# Patient Record
Sex: Male | Born: 1950 | Race: White | Hispanic: No | Marital: Married | Smoking: Former smoker
Health system: Southern US, Community
[De-identification: ages and names within clinical notes are randomized; demographics above are authoritative.]

## PROBLEM LIST (undated history)

## (undated) DIAGNOSIS — M1990 Unspecified osteoarthritis, unspecified site: Secondary | ICD-10-CM

## (undated) DIAGNOSIS — M109 Gout, unspecified: Secondary | ICD-10-CM

## (undated) DIAGNOSIS — K219 Gastro-esophageal reflux disease without esophagitis: Secondary | ICD-10-CM

## (undated) DIAGNOSIS — G459 Transient cerebral ischemic attack, unspecified: Secondary | ICD-10-CM

## (undated) DIAGNOSIS — I639 Cerebral infarction, unspecified: Secondary | ICD-10-CM

## (undated) HISTORY — DX: Gout, unspecified: M10.9

## (undated) HISTORY — PX: KNEE ARTHROSCOPY: SUR90

## (undated) HISTORY — PX: COLONOSCOPY: SHX174

## (undated) HISTORY — DX: Gastro-esophageal reflux disease without esophagitis: K21.9

## (undated) HISTORY — PX: KIDNEY DONATION: SHX685

## (undated) HISTORY — DX: Unspecified osteoarthritis, unspecified site: M19.90

---
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
## 2016-06-03 ENCOUNTER — Ambulatory Visit (INDEPENDENT_AMBULATORY_CARE_PROVIDER_SITE_OTHER): Payer: Medicare HMO | Admitting: Family Medicine

## 2016-06-03 ENCOUNTER — Encounter: Payer: Self-pay | Admitting: Family Medicine

## 2016-06-03 VITALS — BP 122/80 | HR 84 | Temp 98.4°F | Resp 12 | Ht 69.0 in | Wt 211.1 lb

## 2016-06-03 DIAGNOSIS — M109 Gout, unspecified: Secondary | ICD-10-CM | POA: Insufficient documentation

## 2016-06-03 DIAGNOSIS — M1009 Idiopathic gout, multiple sites: Secondary | ICD-10-CM

## 2016-06-03 DIAGNOSIS — J309 Allergic rhinitis, unspecified: Secondary | ICD-10-CM

## 2016-06-03 DIAGNOSIS — M1710 Unilateral primary osteoarthritis, unspecified knee: Secondary | ICD-10-CM | POA: Insufficient documentation

## 2016-06-03 DIAGNOSIS — M170 Bilateral primary osteoarthritis of knee: Secondary | ICD-10-CM | POA: Diagnosis not present

## 2016-06-03 DIAGNOSIS — M179 Osteoarthritis of knee, unspecified: Secondary | ICD-10-CM | POA: Insufficient documentation

## 2016-06-03 MED ORDER — COLCHICINE 0.6 MG PO TABS: 0.6000 mg | ORAL_TABLET | Freq: Two times a day (BID) | ORAL | 1 refills | Status: DC | PRN

## 2016-06-03 MED ORDER — ALLOPURINOL 100 MG PO TABS: 100.0000 mg | ORAL_TABLET | Freq: Every day | ORAL | 1 refills | Status: DC

## 2016-06-03 MED ORDER — FLUTICASONE PROPIONATE 50 MCG/ACT NA SUSP: 2.0000 | Freq: Every day | NASAL | 3 refills | Status: DC

## 2016-06-03 NOTE — Progress Notes (Signed)
HPI:  [name] is a 65 y.o.male here today to establish care with me.  Last CPE: 3 years ago. [place] back to state in November 2016, he was living in [place].  -Concerns and/or follow up today: med refills, cough.  Today he is concerned about left nostril congestion and postnasal drip sensation that triggers cough, symptoms started about 2 weeks ago. Non-productive cough, no associated wheezing or chest pain. Former smoker. He has not noted chills, fever, odynophagia, or facial pain. No epistaxis or Hx of GERD. No sick contact. + Bilateral eye itching and rhinorrhea.   Gout:  He was diagnosed with gout about 4 years ago, started on Allopurinol 100 mg daily. Once he moved to [place], Allopurinol 100 mg was not available, so he continued with allopurinol 300 mg every day. He ran out of medication about 2 weeks ago, he has not had a gout attack in 4 years. Allopurinol was started after 5-7 acute attacks.   He also has history of bilateral knee osteoarthritis, he is on Ibuprofen 600-800 mg daily. Pain seems to be aggravated by weight gain, certain activities as going up and down stairs, kneeling, bending exacerbate pain. Alleviated by rest and Ibuprofen. Mild-moderate aching.    Hx of HLD, last FLP about 3 years ago. He is on non pharmacologic treatment.  S/P nephrectomy, kidney donor in 2006.  He  Is trying to follow a healthy diet and is exercising regularly.     Review of Systems  Constitutional: Negative for activity change, appetite change, fatigue, fever and unexpected weight change.  HENT: Positive for congestion, postnasal drip and rhinorrhea. Negative for dental problem, mouth sores, nosebleeds, sinus pressure, sore throat and trouble swallowing.   Eyes: Positive for itching. Negative for discharge, redness and visual disturbance.  Respiratory: Positive for cough. Negative for apnea, shortness of breath, wheezing and stridor.        Former smoker    Cardiovascular: Negative for chest pain, palpitations and leg swelling.  Gastrointestinal: Negative for abdominal pain, nausea and vomiting.       No changes in bowel habits  Genitourinary: Negative for decreased urine volume, dysuria and hematuria.  Musculoskeletal: Positive for arthralgias. Negative for back pain and gait problem.  Skin: Negative for pallor and rash.  Neurological: Negative for dizziness, seizures, weakness, numbness and headaches.  Psychiatric/Behavioral: Negative for confusion. The patient is not nervous/anxious.      No current outpatient prescriptions on file prior to visit.   No current facility-administered medications on file prior to visit.      Past Medical History:  Diagnosis Date  . Arthritis   . Hyperlipidemia     Not on File  Family History  Problem Relation Age of Onset  . Arthritis Mother   . Arthritis Father     Social History   Social History  . Marital status: Married    Spouse name: N/A  . Number of children: N/A  . Years of education: N/A   Social History Main Topics  . Smoking status: Former Smoker    Quit date: 11/03/1998  . Smokeless tobacco: None  . Alcohol use None  . Drug use: Unknown  . Sexual activity: Not Asked   Other Topics Concern  . None   Social History Narrative  . None     Vitals:   06/03/16 1458  BP: 122/80  Pulse: 84  Resp: 12  Temp: 98.4 F (36.9 C)   Body mass index is 31.18  kg/m.   O2 sat at RA 97%.   Physical Exam  Constitutional: He is oriented to person, place, and time. He appears well-developed. No distress.  HENT:  Head: Atraumatic.  Nose: Septal deviation present. Right sinus exhibits no maxillary sinus tenderness and no frontal sinus tenderness. Left sinus exhibits no maxillary sinus tenderness and no frontal sinus tenderness.  Mouth/Throat: Uvula is midline, oropharynx is clear and moist and mucous membranes are normal.  Postnasal drainage.  Eyes: Conjunctivae and EOM are  normal. Pupils are equal, round, and reactive to light.  Cardiovascular: Normal rate and regular rhythm.   No murmur heard. Pulses:      Dorsalis pedis pulses are 2+ on the right side, and 2+ on the left side.  Respiratory: Effort normal and breath sounds normal. No respiratory distress.  A few times nonproductive cough during visit  GI: Soft. He exhibits no mass. There is no tenderness.  Musculoskeletal: He exhibits edema (1+ pitting edema LE bilateral).  Knee crepitus bilateral,L>R. mild limitation of ROM  Lymphadenopathy:    He has no cervical adenopathy.  Neurological: He is alert and oriented to person, place, and time. He has normal strength.  Skin: Skin is warm. No erythema.  Psychiatric: He has a normal mood and affect.  Well groomed, good eye contact.        ASSESSMENT AND PLAN:    Joseph Orr was seen today for new patient (initial visit).  Diagnoses and all orders for this visit:  Osteoarthritis of both knees, unspecified osteoarthritis type  Weight loss may help. Recommend continuing regular low impact exercise, add Tai Chi. Some side effects of NSAIDs were discussed, recommended trying OTC Tylenol 650 mg 3 times per day.  Gouty arthritis  Resume Allopurinol at lower dose, 100 mg daily. Colchicine twice per day for 1-2 weeks and then as needed. Further recommendations would be given according to lab results. Low-purine diet.  -     allopurinol (ZYLOPRIM) 100 MG tablet; Take 1 tablet (100 mg total) by mouth daily. -     colchicine 0.6 MG tablet; Take 1 tablet (0.6 mg total) by mouth 2 (two) times daily as needed. -     Basic Metabolic Panel -     Uric acid  Allergic rhinitis, unspecified allergic rhinitis type  Cough could be caused by postnasal drainage. He will try intranasal steroid and OTC antihistaminic, Zyrtec 10 mg daily. If symptoms are not better he is going to need ENT evaluation.    -     fluticasone (FLONASE) 50 MCG/ACT nasal spray; Place 2  sprays into both nostrils daily.      -He was advised to return or notify a doctor immediately if symptoms worsen or persist or new concerns arise. -Planning on CPE in 2 months. -Reporting colonoscopy done 5 years ago     Joseph Hamstra G. [place], MD  [facility]. [place] office.

## 2016-06-03 NOTE — Patient Instructions (Addendum)
A few things to remember from today's visit:   Allergic rhinitis, unspecified allergic rhinitis type - Plan: fluticasone (FLONASE) 50 MCG/ACT nasal spray  Gouty arthritis - Plan: allopurinol (ZYLOPRIM) 100 MG tablet, colchicine 0.6 MG tablet, Basic Metabolic Panel, Uric acid  Osteoarthritis of both knees, unspecified osteoarthritis type  Low purine diet to prevent gout. Take colchicine twice daily for 1-2 weeks. Colchicine to continue 2 tablets once upon acute gout onset.  Try Tylenol 650 mg over-the-counter instead ibuprofen, Tai Chi might also help with knee pain.  Over-the-counter Zyrtec or cetirizine 10 mg daily.   We have ordered labs or studies at this visit.  It can take up to 1-2 weeks for results and processing. IF results require follow up or explanation, we will call you with instructions. Clinically stable results will be released to your [facility]. If you have not heard from [place] or cannot find your results in [facility] in 2 weeks please contact our office at [phone].  If you are not yet signed up for [facility], please consider signing up  Please be sure medication list is accurate. If a new problem present, please set up appointment sooner than planned today.

## 2016-06-03 NOTE — Progress Notes (Signed)
Pre visit review using our clinic review tool, if applicable. No additional management support is needed unless otherwise documented below in the visit note. 

## 2016-06-04 LAB — BASIC METABOLIC PANEL
BUN: 12 mg/dL (ref 6–23)
CHLORIDE: 102 meq/L (ref 96–112)
CO2: 24 mEq/L (ref 19–32)
Calcium: 9.3 mg/dL (ref 8.4–10.5)
Creatinine, Ser: 1.27 mg/dL (ref 0.40–1.50)
GFR: 60.49 mL/min (ref >60.00)
Glucose, Bld: 81 mg/dL (ref 70–99)
POTASSIUM: 4.4 meq/L (ref 3.5–5.1)
SODIUM: 138 meq/L (ref 135–145)

## 2016-06-04 LAB — URIC ACID: Uric Acid, Serum: 9.8 mg/dL — ABNORMAL HIGH (ref 4.0–7.8)

## 2016-11-21 ENCOUNTER — Other Ambulatory Visit: Payer: Self-pay | Admitting: Family Medicine

## 2016-11-21 DIAGNOSIS — M109 Gout, unspecified: Secondary | ICD-10-CM

## 2017-02-23 ENCOUNTER — Other Ambulatory Visit: Payer: Self-pay | Admitting: Family Medicine

## 2017-02-23 DIAGNOSIS — M109 Gout, unspecified: Secondary | ICD-10-CM

## 2017-03-11 ENCOUNTER — Ambulatory Visit (INDEPENDENT_AMBULATORY_CARE_PROVIDER_SITE_OTHER): Payer: Medicare HMO | Admitting: Family Medicine

## 2017-03-11 ENCOUNTER — Encounter: Payer: Self-pay | Admitting: Family Medicine

## 2017-03-11 VITALS — BP 159/98 | HR 122 | Temp 98.4°F | Ht 69.0 in | Wt 212.0 lb

## 2017-03-11 DIAGNOSIS — J209 Acute bronchitis, unspecified: Secondary | ICD-10-CM

## 2017-03-11 MED ORDER — HYDROCODONE-HOMATROPINE 5-1.5 MG/5ML PO SYRP: 5.0000 mL | ORAL_SOLUTION | ORAL | 0 refills | Status: DC | PRN

## 2017-03-11 MED ORDER — METHYLPREDNISOLONE ACETATE 40 MG/ML IJ SUSP
40.0000 mg | Freq: Once | INTRAMUSCULAR | Status: AC
  Administered 2017-03-11: 40 mg via INTRAMUSCULAR

## 2017-03-11 MED ORDER — METHYLPREDNISOLONE ACETATE 80 MG/ML IJ SUSP
80.0000 mg | Freq: Once | INTRAMUSCULAR | Status: AC
  Administered 2017-03-11: 80 mg via INTRAMUSCULAR

## 2017-03-11 MED ORDER — AZITHROMYCIN 250 MG PO TABS: ORAL_TABLET | ORAL | 0 refills | Status: DC

## 2017-03-11 NOTE — Progress Notes (Signed)
   Subjective:    Patient ID: Troy Sineichard Lee Kopecky, male    DOB: July 16, 1951, 66 y.o.   MRN: [id number]  HPI Here for 5 days of chest tightness, coughing up clear sputum, and ST. No fever. Using Zyrtec.    Review of Systems  Constitutional: Negative.   HENT: Positive for congestion, postnasal drip and sore throat. Negative for sinus pain and sinus pressure.   Eyes: Negative.   Respiratory: Positive for cough, chest tightness and wheezing. Negative for shortness of breath.   Cardiovascular: Negative.        Objective:   Physical Exam  Constitutional: He appears well-developed and well-nourished.  HENT:  Right Ear: External ear normal.  Left Ear: External ear normal.  Nose: Nose normal.  Mouth/Throat: Oropharynx is clear and moist.  Eyes: Conjunctivae are normal.  Neck: No thyromegaly present.  Pulmonary/Chest: Effort normal. No respiratory distress. He has no rales.  Scattered wheezes and rhonchi   Lymphadenopathy:    He has no cervical adenopathy.          Assessment & Plan:  Bronchitis, treated with a Zpack and a steroid shot.  Gershon CraneStephen Fry, MD

## 2017-03-11 NOTE — Addendum Note (Signed)
Addended by: Janelle FloorHOMPSON, Cassell Voorhies B on: 03/11/2017 01:48 PM   Modules accepted: Orders

## 2017-03-11 NOTE — Patient Instructions (Signed)
WE NOW OFFER   Joseph Orr's FAST TRACK!!!  SAME DAY Appointments for ACUTE CARE  Such as: Sprains, Injuries, cuts, abrasions, rashes, muscle pain, joint pain, back pain Colds, flu, sore throats, headache, allergies, cough, fever  Ear pain, sinus and eye infections Abdominal pain, nausea, vomiting, diarrhea, upset stomach Animal/insect bites  3 Easy Ways to Schedule: Walk-In Scheduling Call in scheduling Mychart Sign-up: [email or web address]         

## 2017-03-19 ENCOUNTER — Encounter: Payer: Self-pay | Admitting: Family Medicine

## 2017-03-19 ENCOUNTER — Ambulatory Visit (INDEPENDENT_AMBULATORY_CARE_PROVIDER_SITE_OTHER): Payer: Medicare HMO | Admitting: Family Medicine

## 2017-03-19 VITALS — BP 152/98 | HR 81 | Temp 98.6°F | Ht 69.0 in | Wt 215.0 lb

## 2017-03-19 DIAGNOSIS — J209 Acute bronchitis, unspecified: Secondary | ICD-10-CM

## 2017-03-19 MED ORDER — ALBUTEROL SULFATE HFA 108 (90 BASE) MCG/ACT IN AERS
2.0000 | INHALATION_SPRAY | RESPIRATORY_TRACT | 0 refills | Status: DC | PRN
Start: 2017-03-19 — End: 2018-04-06

## 2017-03-19 MED ORDER — LEVOFLOXACIN 500 MG PO TABS: 500.0000 mg | ORAL_TABLET | Freq: Every day | ORAL | 0 refills | Status: AC

## 2017-03-19 NOTE — Progress Notes (Signed)
   Subjective:    Patient ID: Joseph Orr, male    DOB: 01-Aug-1951, 66 y.o.   MRN: [id number]  HPI Here to recheck a bronchitis. He was seen here on 03-11-17 and was treated with a steroid shot and a Zpack. He now feels better but he still has a dry cough. He has some wheezing at times.    Review of Systems  Constitutional: Negative.   HENT: Negative.   Eyes: Negative.   Respiratory: Positive for cough, chest tightness and wheezing. Negative for shortness of breath.   Cardiovascular: Negative.        Objective:   Physical Exam  Constitutional: He appears well-developed and well-nourished.  HENT:  Right Ear: External ear normal.  Left Ear: External ear normal.  Nose: Nose normal.  Mouth/Throat: Oropharynx is clear and moist.  Eyes: Conjunctivae are normal.  Neck: No thyromegaly present.  Pulmonary/Chest: Effort normal. No respiratory distress. He has no rales.  Soft scattered wheezes   Lymphadenopathy:    He has no cervical adenopathy.          Assessment & Plan:  Partially treated bronchitis. Use Levaquin and add an albuterol inhaler.  Gershon CraneStephen Haydyn Liddell, MD

## 2017-03-19 NOTE — Patient Instructions (Signed)
WE NOW OFFER   Joseph Orr's FAST TRACK!!!  SAME DAY Appointments for ACUTE CARE  Such as: Sprains, Injuries, cuts, abrasions, rashes, muscle pain, joint pain, back pain Colds, flu, sore throats, headache, allergies, cough, fever  Ear pain, sinus and eye infections Abdominal pain, nausea, vomiting, diarrhea, upset stomach Animal/insect bites  3 Easy Ways to Schedule: Walk-In Scheduling Call in scheduling Mychart Sign-up: [email or web address]         

## 2017-03-27 ENCOUNTER — Telehealth: Payer: Self-pay | Admitting: Family Medicine

## 2017-03-27 DIAGNOSIS — M109 Gout, unspecified: Secondary | ICD-10-CM

## 2017-03-27 MED ORDER — ALLOPURINOL 100 MG PO TABS: 100.0000 mg | ORAL_TABLET | Freq: Every day | ORAL | 0 refills | Status: DC

## 2017-03-27 NOTE — Telephone Encounter (Signed)
I sent in a Rx for 30 days. Please get patient to schedule a follow up or a CPE, hasn't been seen since 06/2016.

## 2017-03-27 NOTE — Telephone Encounter (Signed)
°  The patient needs his Rx refilled:   allopurinol (ZYLOPRIM) 100 MG tablet Sent to:  [facility] [address] - [place], [place] - [address] [place] [phone] (Phone) [phone] (Fax)

## 2017-04-20 ENCOUNTER — Telehealth: Payer: Self-pay | Admitting: Family Medicine

## 2017-04-20 DIAGNOSIS — M109 Gout, unspecified: Secondary | ICD-10-CM

## 2017-04-20 MED ORDER — ALLOPURINOL 100 MG PO TABS: 100.0000 mg | ORAL_TABLET | Freq: Every day | ORAL | 0 refills | Status: DC

## 2017-04-20 NOTE — Telephone Encounter (Signed)
Rx sent. Patient will not receive any further refills without having an appointment.

## 2017-04-20 NOTE — Telephone Encounter (Signed)
Patient needs allopurinol (ZYLOPRIM) 100 MG tablet   Sent to:  [facility] [address] - [place], [place] - [address] [place] [phone] (Phone) [phone] (Fax)   He would like a 90 day supply on automatic refill, so he don't have to keep coming here every month requesting a refill.

## 2017-04-24 NOTE — Telephone Encounter (Signed)
lmovm to call back and schedule follow up or cpe appt  With Dr SwazilandJordan

## 2017-06-16 NOTE — Telephone Encounter (Signed)
Pt states they will be moving out of [place] and will no longer need prescription refills.  Will be getting a new dr where they go.

## 2017-06-16 NOTE — Telephone Encounter (Signed)
Noted. Thanks.

## 2017-07-21 DIAGNOSIS — E785 Hyperlipidemia, unspecified: Secondary | ICD-10-CM | POA: Insufficient documentation

## 2017-07-21 NOTE — Progress Notes (Addendum)
HPI:  Joseph Orr is a 66 y.o.male here today for his routine physical examination.  Last CPE: 2016 [facility] 2016.  He lives with wife.  Regular exercise 3 or more times per week: Just started a month ago. Silver Sneaker 4-5 times per week.  Following a healthy diet: Yes.  Providers he sees regularly:   Eye care provider: Dr Eduardo Osier. Last eye exam 6 months, no glaucoma.  Chronic medical problems: HLD,gout, s/p nephrectomy (kidney donor 2006), allergic rhinitis, and OA (knee).  Hx of STD's: Denies.   Immunization History  Administered Date(s) Administered  .    Marland Kitchen Pneumococcal Polysaccharide-23 11/03/2012  . Tdap 11/03/2012   Functional Status Survey: Is the patient deaf or have difficulty hearing?: No Does the patient have difficulty seeing, even when wearing glasses/contacts?: No Does the patient have difficulty concentrating, remembering, or making decisions?: No Does the patient have difficulty walking or climbing stairs?: Yes (Stairs because knee OA) Does the patient have difficulty dressing or bathing?: No Does the patient have difficulty doing errands alone such as visiting a doctor's office or shopping?: No  Depression screen PHQ 2/9 07/22/2017  Decreased Interest 0  Down, Depressed, Hopeless 0  PHQ - 2 Score 0       Mini-Cog - 07/22/17 0949    Normal clock drawing test? yes   How many words correct? 3      Hearing Screening             Right ear:   Pass Pass Pass  Pass    Left ear:   Pass Pass Pass  Pass      Visual Acuity Screening   Right eye Left eye Both eyes  Without correction:     With correction:    -Hep C screening: Not sure.  Last colon cancer screening: 03/2010, 10 years f/u recommended. Last prostate ca screening: 2016. Nocturia sometimes. Denies dysuria,increased urinary frequency, gross hematuria,or decreased urine output.   -Denies  high alcohol intake or Hx of illicit drug use. He drinks about 2-3 beers daily. Former smoker.   Gout: He is on Allopurinol 100 mg daily. He has not had a gout attack sine 10/2012. Tolerating medication well.   Review of Systems  Constitutional: Negative for activity change, appetite change, fatigue, fever and unexpected weight change.  HENT: Negative for dental problem, mouth sores, nosebleeds, sore throat, trouble swallowing and voice change.   Eyes: Negative for redness and visual disturbance.  Respiratory: Negative for apnea, cough, shortness of breath and wheezing.   Cardiovascular: Negative for chest pain, palpitations and leg swelling.  Gastrointestinal: Negative for abdominal pain, blood in stool, nausea and vomiting.  Endocrine: Negative for cold intolerance, heat intolerance, polydipsia, polyphagia and polyuria.  Genitourinary: Negative for decreased urine volume, dysuria, genital sores, hematuria and testicular pain.  Musculoskeletal: Positive for arthralgias. Negative for back pain and myalgias.  Skin: Negative for color change and rash.  Neurological: Negative for dizziness, syncope, weakness and headaches.  Hematological: Negative for adenopathy. Does not bruise/bleed easily.  Psychiatric/Behavioral: Negative for confusion and sleep disturbance. The patient is not nervous/anxious.   All other systems reviewed and are negative.    Current Outpatient Prescriptions on File Prior to Visit  Medication Sig Dispense Refill  . albuterol (PROVENTIL HFA;VENTOLIN HFA) 108 (90 Base) MCG/ACT inhaler Inhale 2 puffs into the lungs every 4 (four) hours as needed for wheezing or shortness of breath. 1 Inhaler 0  . colchicine  0.6 MG tablet Take 1 tablet (0.6 mg total) by mouth 2 (two) times daily as needed. 45 tablet 1  . fluticasone (FLONASE) 50 MCG/ACT nasal spray Place 2 sprays into both nostrils daily. 16 g 3   No current facility-administered medications on file prior to visit.        Past Medical History:  Diagnosis Date  . Arthritis   . Hyperlipidemia     Past Surgical History:  Procedure Laterality Date  . KIDNEY DONATION    . knees scoped      Allergies  Allergen Reactions  . Percocet [Oxycodone-Acetaminophen]     vomiting    Family History  Problem Relation Age of Onset  . Arthritis Mother   . Arthritis Father     Social History   Social History  . Marital status: Married    Spouse name: N/A  . Number of children: N/A  . Years of education: N/A   Social History Main Topics  . Smoking status: Former Smoker    Quit date: 11/03/1998  . Smokeless tobacco: Never Used  . Alcohol use None  . Drug use: Unknown  . Sexual activity: Not Asked   Other Topics Concern  . None   Social History Narrative  . None     Vitals:   07/22/17 0913  BP: 124/80  Pulse: 63  Resp: 12  SpO2: 96%   Body mass index is 31.62 kg/m.   Wt Readings from Last 3 Encounters:  07/22/17 214 lb 2 oz (97.1 kg)  03/19/17 215 lb (97.5 kg)  03/11/17 212 lb (96.2 kg)    Physical Exam  Nursing note and vitals reviewed. Constitutional: He is oriented to person, place, and time. He appears well-developed. No distress.  HENT:  Head: Normocephalic and atraumatic.  Mouth/Throat: Oropharynx is clear and moist and mucous membranes are normal.  Eyes: Pupils are equal, round, and reactive to light. Conjunctivae and EOM are normal.  Neck: Normal range of motion. No tracheal deviation present. No thyromegaly present.  Cardiovascular: Normal rate and regular rhythm.   No murmur heard. Pulses:      Dorsalis pedis pulses are 2+ on the right side, and 2+ on the left side.  Respiratory: Effort normal and breath sounds normal. No respiratory distress.  GI: Soft. He exhibits no mass. There is no hepatomegaly. There is no tenderness.  Genitourinary: Prostate is not enlarged and not tender.  Musculoskeletal: He exhibits no edema or tenderness.  No signs of synovitis or  major deformities.   Lymphadenopathy:    He has no cervical adenopathy.       Right: No supraclavicular adenopathy present.       Left: No supraclavicular adenopathy present.  Neurological: He is alert and oriented to person, place, and time. He has normal strength. No cranial nerve deficit or sensory deficit. Coordination and gait normal.  Skin: Skin is warm. Rash noted. Rash is maculopapular. Rash is not vesicular. No erythema.     Erythematous rash noted on gluteus, medial aspect,bilateral. No tender.  Psychiatric: He has a normal mood and affect. Cognition and memory are normal.     ASSESSMENT AND PLAN:   Mr. Unique was seen today for annual exam.  Diagnoses and all orders for this visit:  Routine general medical examination at a health care facility  We discussed the importance of regular physical activity and healthy diet for prevention of chronic illness and/or complications. Preventive guidelines reviewed.  Next CPE in 1 year.  The 10-year  ASCVD risk score Denman George DC Jr., et al., 2013) is: 11.7%   Values used to calculate the score:     Age: 73 years     Sex: Male     Is Non-Hispanic African American: No     Diabetic: No     Tobacco smoker: No     Systolic Blood Pressure: 124 mmHg     Is BP treated: No     HDL Cholesterol: 64 mg/dL     Total Cholesterol: 210 mg/dL  Initial Medicare annual wellness visit  We discussed the importance of staying active, physically and mentally, as well as the benefits of a healthy/balnace diet. Low impact exercise that involve stretching and strengthing are ideal.  We discussed preventive screening for the next 5-10 years, summery of recommendations given in AVS:  Colon cancer screening q 10 years, prostate cancer screening as discussed, annual eye exam and glaucoma screening, and diabetes screening, AAA screening once, today aortic US ordered (foremr smoker). Fall prevention.  Advance directives and end of life discussed, he doe  snot have POA or living will, web side information given.    Diabetes mellitus screening -     Comprehensive metabolic panel  Prostate cancer screening -     PSA(Must document that pt has been informed of limitations of PSA testing.)  Colon cancer screening -     Fecal occult blood, imunochemical; Future  Hyperlipidemia, unspecified hyperlipidemia type  Further recommendations will be given according to FLP results. Low fat diet recommended.  -     Lipid panel  Need for influenza vaccination -     Flu vaccine HIGH DOSE PF  Gouty arthritis  Well controlled. No changes in current management. F/U in 12 months.  -     allopurinol (ZYLOPRIM) 100 MG tablet; Take 1 tablet (100 mg total) by mouth daily.  Encounter for abdominal aortic aneurysm screening -     US Aorta; Future  Encounter for HCV screening test for high risk patient -     Hepatitis C antibody screen  Need for vaccination against Streptococcus pneumoniae using pneumococcal conjugate vaccine 13 -     Pneumococcal conjugate vaccine 13-valent  Rash/skin eruption He is not concerned, intermittent and he attributes to heat and prolonged sitting. I recommended OTC Desitin. F/U as needed.    Return in 1 year (on 07/22/2018).    Aiyonna Lucado G. [place], MD  [facility]. [place] office.

## 2017-07-22 ENCOUNTER — Encounter: Payer: Self-pay | Admitting: Family Medicine

## 2017-07-22 ENCOUNTER — Other Ambulatory Visit: Payer: Self-pay | Admitting: Emergency Medicine

## 2017-07-22 ENCOUNTER — Ambulatory Visit (INDEPENDENT_AMBULATORY_CARE_PROVIDER_SITE_OTHER): Payer: Medicare HMO | Admitting: Family Medicine

## 2017-07-22 VITALS — BP 124/80 | HR 63 | Resp 12 | Ht 69.0 in | Wt 214.1 lb

## 2017-07-22 DIAGNOSIS — Z1159 Encounter for screening for other viral diseases: Secondary | ICD-10-CM

## 2017-07-22 DIAGNOSIS — M109 Gout, unspecified: Secondary | ICD-10-CM

## 2017-07-22 DIAGNOSIS — Z125 Encounter for screening for malignant neoplasm of prostate: Secondary | ICD-10-CM

## 2017-07-22 DIAGNOSIS — Z9189 Other specified personal risk factors, not elsewhere classified: Secondary | ICD-10-CM | POA: Diagnosis not present

## 2017-07-22 DIAGNOSIS — Z131 Encounter for screening for diabetes mellitus: Secondary | ICD-10-CM | POA: Diagnosis not present

## 2017-07-22 DIAGNOSIS — Z0000 Encounter for general adult medical examination without abnormal findings: Secondary | ICD-10-CM

## 2017-07-22 DIAGNOSIS — Z1211 Encounter for screening for malignant neoplasm of colon: Secondary | ICD-10-CM | POA: Diagnosis not present

## 2017-07-22 DIAGNOSIS — E785 Hyperlipidemia, unspecified: Secondary | ICD-10-CM

## 2017-07-22 DIAGNOSIS — R21 Rash and other nonspecific skin eruption: Secondary | ICD-10-CM

## 2017-07-22 DIAGNOSIS — Z136 Encounter for screening for cardiovascular disorders: Secondary | ICD-10-CM | POA: Diagnosis not present

## 2017-07-22 DIAGNOSIS — Z0001 Encounter for general adult medical examination with abnormal findings: Secondary | ICD-10-CM

## 2017-07-22 DIAGNOSIS — Z23 Encounter for immunization: Secondary | ICD-10-CM

## 2017-07-22 LAB — COMPREHENSIVE METABOLIC PANEL
ALBUMIN: 4.3 g/dL (ref 3.5–5.2)
ALT: 41 U/L (ref 0–53)
AST: 64 U/L — AB (ref 0–37)
Alkaline Phosphatase: 67 U/L (ref 39–117)
BUN: 11 mg/dL (ref 6–23)
CALCIUM: 9.4 mg/dL (ref 8.4–10.5)
CHLORIDE: 105 meq/L (ref 96–112)
CO2: 27 mEq/L (ref 19–32)
CREATININE: 1.24 mg/dL (ref 0.40–1.50)
GFR: 61.96 mL/min (ref >60.00)
Glucose, Bld: 99 mg/dL (ref 70–99)
POTASSIUM: 4.2 meq/L (ref 3.5–5.1)
Sodium: 141 mEq/L (ref 135–145)
Total Bilirubin: 0.8 mg/dL (ref 0.2–1.2)
Total Protein: 6.7 g/dL (ref 6.0–8.3)

## 2017-07-22 LAB — LIPID PANEL
CHOL/HDL RATIO: 3
Cholesterol: 210 mg/dL — ABNORMAL HIGH (ref 0–200)
HDL: 64 mg/dL (ref >39.00)
LDL CALC: 116 mg/dL — AB (ref 0–99)
NonHDL: 146.02
TRIGLYCERIDES: 149 mg/dL (ref 0.0–149.0)
VLDL: 29.8 mg/dL (ref 0.0–40.0)

## 2017-07-22 LAB — PSA: PSA: 1.21 ng/mL (ref 0.10–4.00)

## 2017-07-22 MED ORDER — ALLOPURINOL 100 MG PO TABS: 100.0000 mg | ORAL_TABLET | Freq: Every day | ORAL | 0 refills | Status: DC

## 2017-07-22 NOTE — Patient Instructions (Addendum)
A few things to remember from today's visit:   Diabetes mellitus screening - Plan: Comprehensive metabolic panel  Prostate cancer screening - Plan: PSA(Must document that pt has been informed of limitations of PSA testing.)  Colon cancer screening  Hyperlipidemia, unspecified hyperlipidemia type - Plan: Lipid panel  Need for influenza vaccination - Plan: Flu vaccine HIGH DOSE PF  Gouty arthritis - Plan: allopurinol (ZYLOPRIM) 100 MG tablet  Encounter for abdominal aortic aneurysm screening - Plan: US Aorta  Encounter for HCV screening test for high risk patient - Plan: Hepatitis C antibody screen  Routine general medical examination at a health care facility  A few tips:  -As we age balance is not as good as it was, so there is a higher risks for falls. Please remove small rugs and furniture that is "in your way" and could increase the risk of falls. Stretching exercises may help with fall prevention: Yoga and Tai Chi are some examples. Low impact exercise is better, so you are not very achy the next day.  -Sun screen and avoidance of direct sun light recommended. Caution with dehydration, if working outdoors be sure to drink enough fluids.  - Some medications are not safe as we age, increases the risk of side effects and can potentially interact with other medication you are also taken;  including some of over the counter medications. Be sure to let me know when you start a new medication even if it is a dietary/vitamin supplement.   -Healthy diet low in red meet/animal fat and sugar + regular physical activity is recommended.      Preventive recommendations in the next 5-10 years:   At least 150 minutes of moderate exercise per week, daily brisk walking for 15-30 min is a good exercise option. Healthy diet low in saturated (animal) fats and sweets and consisting of fresh fruits and vegetables, lean meats such as fish and white chicken and whole grains.  -  Vaccines:   Pneumonia vaccines:  Prevnar 13 Today and next year Pneumovax at 1.   -Screening recommendations for low/normal risk males:  Screening for diabetes at age 3-45 and every 3 years.    Lipid screening at 35 and every 3 years.   Colonoscopy for colon cancer screening at age 31 and until age 57.  Prostate cancer screening: some controversy. Eye exam. Aortic aneurysm screening.    Advance directives:  Please see a lawyer and/or go to this website to help you with advanced directives and designating a health care power of attorney so that your wishes will be followed should you become too ill to make your own medical decisions.  [email or web address]       Please be sure medication list is accurate. If a new problem present, please set up appointment sooner than planned today.

## 2017-07-23 LAB — HEPATITIS C ANTIBODY
HEP C AB: NONREACTIVE
SIGNAL TO CUT-OFF: 0.07 (ref <1.00)

## 2017-07-31 ENCOUNTER — Ambulatory Visit
Admission: RE | Admit: 2017-07-31 | Discharge: 2017-07-31 | Disposition: A | Discharge status: Home / Self Care | Payer: Medicare HMO | Source: Ambulatory Visit | Attending: Family Medicine | Admitting: Family Medicine

## 2017-07-31 DIAGNOSIS — Z136 Encounter for screening for cardiovascular disorders: Secondary | ICD-10-CM | POA: Diagnosis not present

## 2017-07-31 DIAGNOSIS — Z87891 Personal history of nicotine dependence: Secondary | ICD-10-CM | POA: Diagnosis not present

## 2017-08-03 ENCOUNTER — Encounter: Payer: Self-pay | Admitting: Family Medicine

## 2017-08-03 ENCOUNTER — Ambulatory Visit (INDEPENDENT_AMBULATORY_CARE_PROVIDER_SITE_OTHER): Payer: Medicare HMO | Admitting: Family Medicine

## 2017-08-03 VITALS — BP 128/80 | HR 91 | Resp 12 | Ht 69.0 in | Wt 216.2 lb

## 2017-08-03 DIAGNOSIS — S4990XA Unspecified injury of shoulder and upper arm, unspecified arm, initial encounter: Secondary | ICD-10-CM | POA: Diagnosis not present

## 2017-08-03 DIAGNOSIS — M720 Palmar fascial fibromatosis [Dupuytren]: Secondary | ICD-10-CM | POA: Diagnosis not present

## 2017-08-03 DIAGNOSIS — M7542 Impingement syndrome of left shoulder: Secondary | ICD-10-CM

## 2017-08-03 MED ORDER — TRAMADOL HCL 50 MG PO TABS: 50.0000 mg | ORAL_TABLET | Freq: Two times a day (BID) | ORAL | 0 refills | Status: AC | PRN

## 2017-08-03 MED ORDER — CELECOXIB 100 MG PO CAPS: 100.0000 mg | ORAL_CAPSULE | Freq: Two times a day (BID) | ORAL | 0 refills | Status: DC

## 2017-08-03 NOTE — Patient Instructions (Addendum)
A few things to remember from today's visit:   Shoulder injury, initial encounter - Plan: DG Shoulder Left, celecoxib (CELEBREX) 100 MG capsule  Impingement syndrome of left shoulder - Plan: Ambulatory referral to Physical Therapy, celecoxib (CELEBREX) 100 MG capsule, traMADol (ULTRAM) 50 MG tablet  Dupuytren's contracture of right hand - Plan: Ambulatory referral to Orthopedic Surgery   Please be sure medication list is accurate. If a new problem present, please set up appointment sooner than planned today.

## 2017-08-03 NOTE — Progress Notes (Signed)
ACUTE VISIT   HPI:  Chief Complaint  Patient presents with  . left shoulder pain    [name] is a 66 y.o. male, who is here today complaining of 10 days of left shoulder pain after injury while he was lifting a refrigerator. He is right-handed. Pain is overall stable, he has not noted any joint edema or erythema. He has been taking OTC acetaminophen.  Constant achy pain with periodic exacerbation, pain can be up to 8/10. Pain is now radiating to neck and upper back.  Shoulder Pain   The pain is present in the left shoulder. This is a new problem. The current episode started 1 to 4 weeks ago. There has been a history of trauma. The problem occurs constantly. The problem has been unchanged. The pain is at a severity of 3/10. Associated symptoms include a limited range of motion. Pertinent negatives include no fever, inability to bear weight, itching, joint locking, joint swelling, numbness or tingling. The symptoms are aggravated by activity. He has tried acetaminophen for the symptoms. The treatment provided no relief.   Shoulder pain is exacerbated by certain movements and alleviated by rest. He denies prior history of shoulder pain.  He is also complaining of constant limitation of ROM of right fourth finger, for years and getting worse. Cord thickness involving the palmar aspect of finger and extending to palm.  This has been going on for about 10-15 years. No Hx of trauma.   Review of Systems  Constitutional: Negative for appetite change, chills, fatigue and fever.  HENT: Negative for mouth sores, nosebleeds, sore throat and trouble swallowing.   Respiratory: Negative for cough, chest tightness, shortness of breath and wheezing.   Cardiovascular: Negative for palpitations.  Musculoskeletal: Positive for arthralgias and neck pain. Negative for joint swelling and myalgias.  Skin: Negative for itching, pallor and rash.  Neurological: Negative for  tingling, weakness and numbness.  Psychiatric/Behavioral: Positive for sleep disturbance. Negative for confusion.    Current Outpatient Prescriptions on File Prior to Visit  Medication Sig Dispense Refill  . albuterol (PROVENTIL HFA;VENTOLIN HFA) 108 (90 Base) MCG/ACT inhaler Inhale 2 puffs into the lungs every 4 (four) hours as needed for wheezing or shortness of breath. 1 Inhaler 0  . allopurinol (ZYLOPRIM) 100 MG tablet Take 1 tablet (100 mg total) by mouth daily. 90 tablet 0  . colchicine 0.6 MG tablet Take 1 tablet (0.6 mg total) by mouth 2 (two) times daily as needed. 45 tablet 1  . fluticasone (FLONASE) 50 MCG/ACT nasal spray Place 2 sprays into both nostrils daily. 16 g 3   No current facility-administered medications on file prior to visit.      Past Medical History:  Diagnosis Date  . Arthritis   . Hyperlipidemia    Allergies  Allergen Reactions  . Percocet [Oxycodone-Acetaminophen]     vomiting    Social History   Social History  . Marital status: Married    Spouse name: N/A  . Number of children: N/A  . Years of education: N/A   Social History Main Topics  . Smoking status: Former Smoker    Quit date: 11/03/1998  . Smokeless tobacco: Never Used  . Alcohol use None  . Drug use: Unknown  . Sexual activity: Not Asked   Other Topics Concern  . None   Social History Narrative  . None    Vitals:   08/03/17 1611  BP: 128/80  Pulse: 91  Resp: 12  SpO2: 95%   Body mass index is 31.93 kg/m.  Physical Exam  Nursing note and vitals reviewed. Constitutional: He is oriented to person, place, and time. He appears well-developed. No distress.  HENT:  Head: Normocephalic and atraumatic.  Mouth/Throat: Oropharynx is clear and moist and mucous membranes are normal.  Eyes: Conjunctivae are normal.  Cardiovascular: Normal rate and regular rhythm.   No murmur heard. Pulses:      Radial pulses are 2+ on the right side, and 2+ on the left side.  Respiratory:  Effort normal and breath sounds normal. No respiratory distress.  Musculoskeletal: He exhibits tenderness. He exhibits no edema.       Left shoulder: He exhibits decreased range of motion and tenderness. He exhibits no bony tenderness and no swelling.       Cervical back: He exhibits no tenderness and no bony tenderness.       Thoracic back: He exhibits no tenderness.       Hands: Left shoulder: No deformity, edema, or erythema appreciated. Mild muscle atrophy. Pain upon palpation of posterior and anterior aspect fo shoulder.  Joseph Orr' test pos, drop arm rotator cuff test neg, empty can supraspinatus test pos, cross body adduction test mild pain, lift-Off Subscapularis test pos. ROM mildly limited, active > passive. Movement elicits pain   Right hand: 4th finger thickness along palmar aspect and extending vertically to mid aspect of palm. Fixed flexion of PIP and MCP joint.    Lymphadenopathy:    He has no cervical adenopathy.  Neurological: He is alert and oriented to person, place, and time. He has normal strength. Gait normal.  Skin: No rash noted. No erythema.  Psychiatric: He has a normal mood and affect.  Well groomed, good eye contact.    ASSESSMENT AND PLAN:   [name] was seen today for left shoulder pain.  Diagnoses and all orders for this visit:  Shoulder injury, initial encounter  Some side effects of Celebrex discussed, 7 days treatment recommended. Further recommendations will be given according to imaging results.  -     DG Shoulder Left; Future -     celecoxib (CELEBREX) 100 MG capsule; Take 1 capsule (100 mg total) by mouth 2 (two) times daily.  Impingement syndrome of left shoulder  Treatment options discussed. Tramadol side effects discussed, recommend taking it at night. ROM exercises while waiting for PT. His daughter is OT and he would like to ask her before PT appt is arranged, he will call with information.  -     Ambulatory referral to Physical  Therapy -     celecoxib (CELEBREX) 100 MG capsule; Take 1 capsule (100 mg total) by mouth 2 (two) times daily. -     traMADol (ULTRAM) 50 MG tablet; Take 1 tablet (50 mg total) by mouth every 12 (twelve) hours as needed.  Dupuytren's contracture of right hand  Dx discussed. Ortho/hand specialist referral placed.  -     Ambulatory referral to Orthopedic Surgery    -[name] was advised to seek immediate medical attention if sudden worsening symptoms or to follow if they persist or if new concerns arise.       Debby Clyne G. Swaz[place], MD  [facility]. [place] office.

## 2017-08-07 ENCOUNTER — Other Ambulatory Visit: Payer: Self-pay | Admitting: Family Medicine

## 2017-08-07 ENCOUNTER — Ambulatory Visit: Payer: Medicare HMO | Attending: Family Medicine

## 2017-08-07 DIAGNOSIS — M25512 Pain in left shoulder: Secondary | ICD-10-CM | POA: Diagnosis not present

## 2017-08-07 DIAGNOSIS — M62838 Other muscle spasm: Secondary | ICD-10-CM

## 2017-08-07 DIAGNOSIS — S4990XA Unspecified injury of shoulder and upper arm, unspecified arm, initial encounter: Secondary | ICD-10-CM

## 2017-08-07 DIAGNOSIS — M25612 Stiffness of left shoulder, not elsewhere classified: Secondary | ICD-10-CM | POA: Insufficient documentation

## 2017-08-07 DIAGNOSIS — M7542 Impingement syndrome of left shoulder: Secondary | ICD-10-CM

## 2017-08-07 NOTE — Therapy (Signed)
[facility] [address] [place], [place], [address] Phone: [phone]   Fax:  [phone]  Physical Therapy Evaluation  Patient Details  Name: Joseph Orr MRN: [id number] Date of Birth: [birth year] Referring Provider: Betty Swaziland, MD  Encounter Date: 08/07/2017      PT End of Session - 08/07/17 0916    Visit Number 1   Number of Visits 16   Date for PT Re-Evaluation 09/30/17   Authorization Type A[employer]na Medicare   PT Start Time [phone]   PT Stop Time 0940   PT Time Calculation (min) 50 min   Activity Tolerance Patient tolerated treatment well   Behavior During Therapy [facility] for tasks assessed/performed      Past Medical History:  Diagnosis Date  . Arthritis   . Hyperlipidemia     Past Surgical History:  Procedure Laterality Date  . KIDNEY DONATION    . knees scoped      There were no vitals filed for this visit.       Subjective Assessment - 08/07/17 0858    Subjective He reported moving lifting pulled bicep some and posterior LT shoulder.    Limitations Lifting  reaching, driving, lying on LT side, resisted movements   How long can you sit comfortably? NA   How long can you stand comfortably? NA   How long can you walk comfortably? NA   Diagnostic tests None   Patient Stated Goals He would like to get rid of pain nadimprove strength   Currently in Pain? Yes   Pain Score 2    Pain Location Shoulder   Pain Orientation Left;Posterior  to lateral scapula   Pain Descriptors / Indicators Aching;Dull;Sharp   Pain Type Acute pain   Pain Onset 1 to 4 weeks ago   Pain Frequency Constant   Aggravating Factors  see above   Pain Relieving Factors medication,    Multiple Pain Sites No            [facility] PT Assessment - 08/07/17 0001      Assessment   Medical Diagnosis Lt shoulder pain    Referring Provider Joseph Swaziland, MD   Onset Date/Surgical Date 07/24/17   Next MD Visit As needed   Prior Therapy  No     Precautions   Precautions None     Restrictions   Weight Bearing Restrictions No     Balance Screen   Has the patient fallen in the past 6 months No   Has the patient had a decrease in activity level because of a fear of falling?  No   Is the patient reluctant to leave their home because of a fear of falling?  No     Prior Function   Level of Independence Independent   Vocation Part time employment   Vocation Requirements drive for Joseph Orr   Overall Cognitive Status Within Functional Limits for tasks assessed     Observation/Other Assessments   Focus on Therapeutic Outcomes (FOTO)  44% limited     Posture/Postural Control   Posture/Postural Control No significant limitations   Posture Comments mild rounded shoulders bilaterlaly     ROM / Strength   AROM / PROM / Strength AROM;PROM;Strength     AROM   AROM Assessment Site Shoulder   Right/Left Shoulder Right;Left   Right Shoulder Flexion 145 Degrees   Right Shoulder ABduction 158 Degrees   Right Shoulder Internal Rotation 40 Degrees   Right Shoulder  External Rotation 100 Degrees   Right Shoulder Horizontal ABduction 30 Degrees   Right Shoulder Horizontal  ADduction 110 Degrees   Left Shoulder Flexion 148 Degrees   Left Shoulder ABduction 140 Degrees   Left Shoulder Internal Rotation 0 Degrees   Left Shoulder External Rotation 88 Degrees   Left Shoulder Horizontal ABduction 25 Degrees   Left Shoulder Horizontal ADduction 105 Degrees     PROM   Overall PROM Comments Equal to RT except IR 35 degrees on LT   PAin with IR and ER and HOR ADD, and flexion     Strength   Overall Strength Comments Normal strength bilaterally but with pain in  biceps /anterior shoulder with bicep testing and pain posterior shoulder with tricep and extdension tesing and pain with abduction and ER      Palpation   Palpation comment Tender to patpation posterior shoulder to scapula / superior and inferior to acromiium  and   proximal  bicep tendon            Objective measurements completed on examination: See above findings.          [facility] Adult PT Treatment/Exercise - 08/07/17 0001      Self-Care   Self-Care Heat/Ice Application;Other Self-Care Comments   Heat/Ice Application ice pack 3x/day for 15 min if able   Other Self-Care Comments  sit with arm supported on pillow to decrease pull on shoulder and muscle tension and management of kineseotape                PT Education - 08/07/17 0949    Education provided Yes   Education Details Pt instructed in management of kineseotape to remove if skin irritated and take off in 3 days if helpful. , ice benefit and application , POC,    Person(s) Educated Patient   Methods Explanation   Comprehension Returned demonstration;Verbalized understanding          PT Short Term Goals - 08/07/17 0954      PT SHORT TERM GOAL #1   Title He will be independent with inital HEp   Time 3   Period Weeks   Status New     PT SHORT TERM GOAL #2   Title He will report pain decreased 30% or more with using arm for lfting and reaching   Time 3   Period Weeks   Status New     PT SHORT TERM GOAL #3   Title He will improve active IR to 30 degrees or moe with decr pain.    Time 3   Period Weeks   Status New           PT Long Term Goals - 08/07/17 0955      PT LONG TERM GOAL #1   Title He will be independent in all hEP issued   Time 8   Period Weeks   Status New     PT LONG TERM GOAL #2   Title FOTO score imrove to < 20% limited   Time 8   Period Weeks   Status New     PT LONG TERM GOAL #3   Title he will be able to reach overhead with 1/10 max pain to access cabinets    Time 8   Period Weeks   Status New     PT LONG TERM GOAL #4   Baseline He will be able to lift 20 pounds floor to waist with no pain   Time 8  Period Weeks   Status New     PT LONG TERM GOAL #5   Title He will be able to drive for job with no Lt shoulder  pain   Time 8   Period Weeks   Status New                Plan - 2017/08/30 0950    Clinical Impression Statement Joseph Orr presents post injury to LT shoulder with pain that is resolving but is still interfering with use of LT arm and  limiting motion    Clinical Presentation Stable   Clinical Decision Making Low   Rehab Potential Good   PT Frequency 2x / week   PT Duration 8 weeks   PT Treatment/Interventions Cryotherapy;Ultrasound;Taping;Manual techniques;Therapeutic exercise;Therapeutic activities;Dry needling;Patient/family education   PT Next Visit Plan REview tape, manual for soft tissue pain, [place] , possibly add isometrics or band exer   PT Home Exercise Plan ice and rest arm   Consulted and Agree with Plan of Care Patient      Patient will benefit from skilled therapeutic intervention in order to improve the following deficits and impairments:  Pain, Impaired UE functional use, Decreased range of motion, Decreased activity tolerance, Increased muscle spasms  Visit Diagnosis: Acute pain of left shoulder - Plan: PT plan of care cert/re-cert  Stiffness of left shoulder, not elsewhere classified - Plan: PT plan of care cert/re-cert  Other muscle spasm - Plan: PT plan of care cert/re-cert      G-Codes - 2017/08/30 1308    Functional Limitation Carrying, moving and handling objects   Carrying, Moving and Handling Objects Current Status (M5784) At least 40 percent but less than 60 percent impaired, limited or restricted   Carrying, Moving and Handling Objects Goal Status (O9629) At least 1 percent but less than 20 percent impaired, limited or restricted       Problem List Patient Active Problem List   Diagnosis Date Noted  . Hyperlipidemia 07/21/2017  . Gouty arthritis 06/03/2016  . Osteoarthritis of knee 06/03/2016    Caprice Red  PT 30-Aug-2017, 10:04 AM  [facility] [address] [place], [place],  [address] Phone: [phone]   Fax:  [phone]  Name: Joseph Orr MRN: [id number] Date of Birth: 1951/10/29

## 2017-08-11 ENCOUNTER — Ambulatory Visit: Payer: Medicare HMO

## 2017-08-11 DIAGNOSIS — M62838 Other muscle spasm: Secondary | ICD-10-CM

## 2017-08-11 DIAGNOSIS — M25512 Pain in left shoulder: Secondary | ICD-10-CM

## 2017-08-11 DIAGNOSIS — M25612 Stiffness of left shoulder, not elsewhere classified: Secondary | ICD-10-CM

## 2017-08-11 NOTE — Therapy (Signed)
[facility] [address] [place], [place], [address] Phone: [phone]   Fax:  [phone]  Physical Therapy Treatment  Patient Details  Name: Otha Monical MRN: [id number] Date of Birth: April 29, 1951 Referring Provider: Betty Swazil[place], MD  Encounter Date: 08/11/2017      PT End of Session - 08/11/17 0850    Visit Number 2   Number of Visits 16   Date for PT Re-Evaluation 09/30/17   Authorization Type A[employer]na Medicare   PT Start Time [phone]   PT Stop Time 0935   PT Time Calculation (min) 46 min   Activity Tolerance Patient tolerated treatment well;No increased pain   Behavior During Therapy WFL for tasks assessed/performed      Past Medical History:  Diagnosis Date  . Arthritis   . Hyperlipidemia     Past Surgical History:  Procedure Laterality Date  . KIDNEY DONATION    . knees scoped      There were no vitals filed for this visit.      Subjective Assessment - 08/11/17 0850    Subjective 2/10 pain doing OK.    tape didn't really help. Sore psot arm bike posteriorly   Currently in Pain? Yes   Pain Score 2    Pain Location Shoulder   Pain Orientation Left;Posterior   Pain Descriptors / Indicators Aching   Pain Type Acute pain   Pain Onset 1 to 4 weeks ago   Pain Frequency Constant                         [facility] Adult PT Treatment/Exercise - 08/11/17 0001      Shoulder Exercises: Isometric Strengthening   Flexion 5X5"   Extension 5X5"   External Rotation 5X5"   Internal Rotation 5X5"   ABduction 5X5"   ADduction 5X5"     Modalities   Modalities Ultrasound     Ultrasound   Ultrasound Location Lt shoulder    Ultrasound Parameters 100% ! MHz 1.5 Wcm2   Ultrasound Goals Pain     Manual Therapy   Manual Therapy Soft tissue mobilization;Joint mobilization;Passive ROM   Soft tissue mobilization anterio/posterior/superiotr/lateral LT shoulder with tool post [place]                 PT Education - 08/11/17 0932    Education provided Yes   Education Details shoulder isometrics   Person(s) Educated Patient   Methods Explanation;Demonstration;Tactile cues;Verbal cues;Handout   Comprehension Returned demonstration;Verbalized understanding          PT Short Term Goals - 08/07/17 0954      PT SHORT TERM GOAL #1   Title He will be independent with inital HEp   Time 3   Period Weeks   Status New     PT SHORT TERM GOAL #2   Title He will report pain decreased 30% or more with using arm for lfting and reaching   Time 3   Period Weeks   Status New     PT SHORT TERM GOAL #3   Title He will improve active IR to 30 degrees or moe with decr pain.    Time 3   Period Weeks   Status New           PT Long Term Goals - 08/07/17 0955      PT LONG TERM GOAL #1   Title He will be independent in all hEP issued   Time 8  Period Weeks   Status New     PT LONG TERM GOAL #2   Title FOTO score imrove to < 20% limited   Time 8   Period Weeks   Status New     PT LONG TERM GOAL #3   Title he will be able to reach overhead with 1/10 max pain to access cabinets    Time 8   Period Weeks   Status New     PT LONG TERM GOAL #4   Baseline He will be able to lift 20 pounds floor to waist with no pain   Time 8   Period Weeks   Status New     PT LONG TERM GOAL #5   Title 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 tolerated activity .  continue manual and gentle exercise.    PT Treatment/Interventions Cryotherapy;Ultrasound;Taping;Manual techniques;Therapeutic exercise;Therapeutic activities;Dry needling;Patient/family education   PT Next Visit Plan REview tape, manual for soft tissue pain, [place] , possibly add isometrics or band exer   PT Home Exercise Plan ice and rest arm, isometrics   Consulted and Agree with Plan of Care  Patient      Patient will benefit from skilled therapeutic intervention in order to improve the following deficits and impairments:  Pain, Impaired UE functional use, Decreased range of motion, Decreased activity tolerance, Increased muscle spasms  Visit Diagnosis: Acute pain of left shoulder  Stiffness of left shoulder, not elsewhere classified  Other muscle spasm     Problem List Patient Active Problem List   Diagnosis Date Noted  . Hyperlipidemia 07/21/2017  . Gouty arthritis 06/03/2016  . Osteoarthritis of knee 06/03/2016    Caprice Red  PT 08/11/2017, 9:34 AM  [facility] [address] [place], [place], [address] Phone: [phone]   Fax:  [phone]  Name: Add Dinapoli MRN: [id number] Date of Birth: 1951-08-04

## 2017-08-11 NOTE — Patient Instructions (Signed)
Isometric from cabinet 5 reps 5 sec x 2 per day no pain, modifiy pressure for comfort

## 2017-08-13 ENCOUNTER — Ambulatory Visit: Payer: Medicare HMO

## 2017-08-17 ENCOUNTER — Other Ambulatory Visit: Payer: Self-pay | Admitting: Family Medicine

## 2017-08-17 DIAGNOSIS — M7542 Impingement syndrome of left shoulder: Secondary | ICD-10-CM

## 2017-08-17 DIAGNOSIS — S4990XA Unspecified injury of shoulder and upper arm, unspecified arm, initial encounter: Secondary | ICD-10-CM

## 2017-08-18 ENCOUNTER — Ambulatory Visit: Payer: Medicare HMO

## 2017-08-18 DIAGNOSIS — M62838 Other muscle spasm: Secondary | ICD-10-CM

## 2017-08-18 DIAGNOSIS — M25612 Stiffness of left shoulder, not elsewhere classified: Secondary | ICD-10-CM

## 2017-08-18 DIAGNOSIS — M25512 Pain in left shoulder: Secondary | ICD-10-CM | POA: Diagnosis not present

## 2017-08-18 NOTE — Patient Instructions (Signed)
Strengthening: Resisted Internal Rotation   Hold tubing in left hand, elbow at side and forearm out. Rotate forearm in across body. Repeat ____ times per set. Do ____ sets per session. Do ____ sessions per day.  http://orth.exer.us/830   Copyright  VHI. All rights reserved.  Strengthening: Resisted External Rotation   Hold tubing in right hand, elbow at side and forearm across body. Rotate forearm out. Repeat ____ times per set. Do ____ sets per session. Do ____ sessions per day.  http://orth.exer.us/828   Copyright  VHI. All rights reserved.  Strengthening: Resisted Flexion   Hold tubing with left arm at side. Pull forward and up. Move shoulder through pain-free range of motion. Repeat ____ times per set. Do ____ sets per session. Do ____ sessions per day.  http://orth.exer.us/824   Copyright  VHI. All rights reserved.  Strengthening: Resisted Extension   Hold tubing in right hand, arm forward. Pull arm back, elbow straight. Repeat ____ times per set. Do ____ sets per session. Do ____ sessions per day.  http://orth.exer.us/832   Copyright  VHI. All rights reserved.  ALL X10-15 REPS 1-2X/DAY   STOP IF PAINFUL

## 2017-08-18 NOTE — Therapy (Signed)
[facility] Outpatient Rehabilitation [facility] [address] [place], [place], [address] Phone: [phone]   Fax:  [phone]  Physical Therapy Treatment  Patient Details  Name: Joseph Orr MRN: [id number] Date of Birth: [birth year] Referring Provider: Betty Swaziland, MD  Encounter Date: 08/18/2017      PT End of Session - 08/18/17 0807    Visit Number 3   Number of Visits 16   Date for PT Re-Evaluation 09/30/17   Authorization Type Aetna Medicare   PT Start Time 0802   PT Stop Time 0845   PT Time Calculation (min) 43 min   Activity Tolerance Patient tolerated treatment well   Behavior During Therapy [facility] for tasks assessed/performed      Past Medical History:  Diagnosis Date  . Arthritis   . Hyperlipidemia     Past Surgical History:  Procedure Laterality Date  . KIDNEY DONATION    . knees scoped      There were no vitals filed for this visit.      Subjective Assessment - 08/18/17 0803    Subjective A little . no bad . most due to more reps and time with isometrics.   Only 2 of the exer bother , flexion nad abduction .   ice helps   Currently in Pain? Yes   Pain Score 2    Pain Location Shoulder   Pain Orientation Left;Posterior   Pain Descriptors / Indicators Aching   Pain Type Acute pain   Pain Onset More than a month ago   Pain Frequency Intermittent   Aggravating Factors  exercise   Pain Relieving Factors rest   Multiple Pain Sites No                         [facility] Adult PT Treatment/Exercise - 08/18/17 0001      Shoulder Exercises: Standing   Other Standing Exercises [place] x 12 yellowband issued for home   Other Standing Exercises cross chest stretch LT 30 sec x 2     Ultrasound   Ultrasound Location Lt shoulder   Ultrasound Parameters 100$% !mhz 1.2 Wcm2   Ultrasound Goals Pain     Manual Therapy   Soft tissue mobilization anterio/posterior/superiotr/lateral LT shoulder with hands emphasis posterior      Also reveiwed isometrics           PT Education - 08/18/17 0842    Education provided Yes   Education Details rockwood yellow band, post shoulder stretch   Person(s) Educated Patient   Methods Explanation;Demonstration;Verbal cues;Handout   Comprehension Verbalized understanding;Returned demonstration          PT Short Term Goals - 08/07/17 0954      PT SHORT TERM GOAL #1   Title He will be independent with inital HEp   Time 3   Period Weeks   Status New     PT SHORT TERM GOAL #2   Title He will report pain decreased 30% or more with using arm for lfting and reaching   Time 3   Period Weeks   Status New     PT SHORT TERM GOAL #3   Title He will improve active IR to 30 degrees or moe with decr pain.    Time 3   Period Weeks   Status New           PT Long Term Goals - 08/07/17 0955      PT LONG TERM GOAL #1  Title He will be independent in all hEP issued   Time 8   Period Weeks   Status New     PT LONG TERM GOAL #2   Title FOTO score imrove to < 20% limited   Time 8   Period Weeks   Status New     PT LONG TERM GOAL #3   Title he will be able to reach overhead with 1/10 max pain to access cabinets    Time 8   Period Weeks   Status New     PT LONG TERM GOAL #4   Baseline He will be able to lift 20 pounds floor to waist with no pain   Time 8   Period Weeks   Status New     PT LONG TERM GOAL #5   Title He will be able to drive for job with no Lt shoulder pain   Time 8   Period Weeks   Status New               Plan - 08/18/17 1324    Clinical Impression Statement Improving with less pain generally and tolerates increased resistance with exercises but still sensitive and need to be careful not to irritate    PT Treatment/Interventions Cryotherapy;Ultrasound;Taping;Manual techniques;Therapeutic exercise;Therapeutic activities;Dry needling;Patient/family education   PT Next Visit Plan REview tape, manual for soft tissue pain, [place] ,  possibly increase isometrics or band exer   PT Home Exercise Plan ice and rest arm, isometrics, rockwood , cross shoulder stretch   Consulted and Agree with Plan of Care Patient      Patient will benefit from skilled therapeutic intervention in order to improve the following deficits and impairments:  Pain, Impaired UE functional use, Decreased range of motion, Decreased activity tolerance, Increased muscle spasms  Visit Diagnosis: Acute pain of left shoulder  Stiffness of left shoulder, not elsewhere classified  Other muscle spasm     Problem List Patient Active Problem List   Diagnosis Date Noted  . Hyperlipidemia 07/21/2017  . Gouty arthritis 06/03/2016  . Osteoarthritis of knee 06/03/2016    Caprice Red  PT 08/18/2017, 8:46 AM  [facility] [address] [place], [place], [address] Phone: [phone]   Fax:  [phone]  Name: Joseph Orr MRN: [id number] Date of Birth: 16-Oct-1951

## 2017-08-19 DIAGNOSIS — M720 Palmar fascial fibromatosis [Dupuytren]: Secondary | ICD-10-CM | POA: Diagnosis not present

## 2017-08-20 ENCOUNTER — Ambulatory Visit: Payer: Medicare HMO

## 2017-08-20 ENCOUNTER — Other Ambulatory Visit: Payer: Self-pay | Admitting: Family Medicine

## 2017-08-20 DIAGNOSIS — M25512 Pain in left shoulder: Secondary | ICD-10-CM

## 2017-08-20 DIAGNOSIS — S4990XA Unspecified injury of shoulder and upper arm, unspecified arm, initial encounter: Secondary | ICD-10-CM

## 2017-08-20 DIAGNOSIS — M7542 Impingement syndrome of left shoulder: Secondary | ICD-10-CM

## 2017-08-20 DIAGNOSIS — M62838 Other muscle spasm: Secondary | ICD-10-CM

## 2017-08-20 DIAGNOSIS — M25612 Stiffness of left shoulder, not elsewhere classified: Secondary | ICD-10-CM

## 2017-08-20 NOTE — Therapy (Signed)
[facility] [address] [place], [place], [address] Phone: [phone]   Fax:  [phone]  Physical Therapy Treatment  Patient Details  Name: Joseph SineRichard Lee Orr MRN: [id number] Date of Birth: [birth year] Referring Provider: Betty SwazilandJordan, MD  Encounter Date: 08/20/2017      PT End of Session - 08/20/17 0759    Visit Number 4   Number of Visits 16   Date for PT Re-Evaluation 09/30/17   Authorization Type Aetna Medicare   PT Start Time [phone]   PT Stop Time 0840   PT Time Calculation (min) 43 min   Activity Tolerance Patient tolerated treatment well   Behavior During Therapy [facility] for tasks assessed/performed      Past Medical History:  Diagnosis Date  . Arthritis   . Hyperlipidemia     Past Surgical History:  Procedure Laterality Date  . KIDNEY DONATION    . knees scoped      There were no vitals filed for this visit.      Subjective Assessment - 08/20/17 0800    Subjective Slept funny on LT shoulder so sore today.   Did 2 sets of the exer without incr pain.     Pain Score 3    Pain Location Shoulder   Pain Orientation Left;Posterior   Pain Type Acute pain   Pain Onset More than a month ago   Pain Frequency Intermittent   Aggravating Factors  sleeping on shoulder,    Pain Relieving Factors rest, med            [facility] PT Assessment - 08/20/17 0001      AROM   Left Shoulder ABduction 150 Degrees   Left Shoulder Internal Rotation 45 Degrees   Left Shoulder External Rotation 93 Degrees                     [facility] Adult PT Treatment/Exercise - 08/20/17 0001      Shoulder Exercises: Seated   External Rotation 12 reps   External Rotation Limitations isometrics 10 sec     Shoulder Exercises: Standing   Other Standing Exercises [place] x 12  red band issued for home   Other Standing Exercises cross chest stretch LT 30 sec x 2     Shoulder Exercises: ROM/Strengthening   UBE (Upper Arm  Bike) L2 3 min for 2 min back then became irritated so stopped     Ultrasound   Ultrasound Location LT shoulder    Ultrasound Parameters 100% , 1 MHz , 1.5 Wcm2   Ultrasound Goals Pain     Manual Therapy   Soft tissue mobilization anterio/posterior/superiotr/lateral LT shoulder with hands emphasis posterior                  PT Short Term Goals - 08/20/17 [id number]      PT SHORT TERM GOAL #1   Title He will be independent with inital HEp   Status Achieved     PT SHORT TERM GOAL #2   Title He will report pain decreased 30% or more with using arm for lfting and reaching   Status On-going     PT SHORT TERM GOAL #3   Title He will improve active IR to 30 degrees or moe with decr pain.    Status Achieved           PT Long Term Goals - 08/07/17 0955      PT LONG TERM GOAL #1   Title  He will be independent in all hEP issued   Time 8   Period Weeks   Status New     PT LONG TERM GOAL #2   Title FOTO score imrove to < 20% limited   Time 8   Period Weeks   Status New     PT LONG TERM GOAL #3   Title he will be able to reach overhead with 1/10 max pain to access cabinets    Time 8   Period Weeks   Status New     PT LONG TERM GOAL #4   Baseline He will be able to lift 20 pounds floor to waist with no pain   Time 8   Period Weeks   Status New     PT LONG TERM GOAL #5   Title He will be able to drive for job with no Lt shoulder pain   Time 8   Period Weeks   Status New               Plan - 08/20/17 2956    Clinical Impression Statement Improved AROM and tolerance of red band. Progressing but still sensitive. discussed to stop if irritation incr and drop to easier weight    PT Treatment/Interventions Cryotherapy;Ultrasound;Taping;Manual techniques;Therapeutic exercise;Therapeutic activities;Dry needling;Patient/family education   PT Next Visit Plan  manual for soft tissue pain, [place] , possibly increase isometrics or band exer   PT Home Exercise Plan ice  and rest arm, isometrics, rockwood , cross shoulder stretch   Consulted and Agree with Plan of Care Patient      Patient will benefit from skilled therapeutic intervention in order to improve the following deficits and impairments:  Pain, Impaired UE functional use, Decreased range of motion, Decreased activity tolerance, Increased muscle spasms  Visit Diagnosis: Acute pain of left shoulder  Stiffness of left shoulder, not elsewhere classified  Other muscle spasm     Problem List Patient Active Problem List   Diagnosis Date Noted  . Hyperlipidemia 07/21/2017  . Gouty arthritis 06/03/2016  . Osteoarthritis of knee 06/03/2016    Caprice Red  PT 08/20/2017, 8:47 AM  [facility] [address] [place], [place], [address] Phone: [phone]   Fax:  [phone]  Name: Joseph Orr MRN: [id number] Date of Birth: Sep 12, 1951

## 2017-08-25 ENCOUNTER — Ambulatory Visit: Payer: Medicare HMO

## 2017-08-25 ENCOUNTER — Other Ambulatory Visit: Payer: Self-pay | Admitting: Family Medicine

## 2017-08-25 DIAGNOSIS — M62838 Other muscle spasm: Secondary | ICD-10-CM

## 2017-08-25 DIAGNOSIS — M25612 Stiffness of left shoulder, not elsewhere classified: Secondary | ICD-10-CM

## 2017-08-25 DIAGNOSIS — M25512 Pain in left shoulder: Secondary | ICD-10-CM

## 2017-08-25 DIAGNOSIS — M7542 Impingement syndrome of left shoulder: Secondary | ICD-10-CM

## 2017-08-25 DIAGNOSIS — S4990XA Unspecified injury of shoulder and upper arm, unspecified arm, initial encounter: Secondary | ICD-10-CM
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
## 2017-08-27 ENCOUNTER — Ambulatory Visit: Payer: Medicare HMO

## 2017-08-27 DIAGNOSIS — M25612 Stiffness of left shoulder, not elsewhere classified: Secondary | ICD-10-CM

## 2017-08-27 DIAGNOSIS — M62838 Other muscle spasm: Secondary | ICD-10-CM

## 2017-08-27 DIAGNOSIS — M25512 Pain in left shoulder: Secondary | ICD-10-CM

## 2017-08-27 NOTE — Therapy (Signed)
[facility] Outpatient Rehabilitation [facility] [address] [place], [place], [address] Phone: [phone]   Fax:  [phone]  Physical Therapy Treatment  Patient Details  Name: Damarea Merkel MRN: [id number] Date of Birth: 1951/10/05 Referring Provider: Betty Swaziland, MD  Encounter Date: 08/27/2017      PT End of Session - 08/27/17 0757    Visit Number 6   Number of Visits 16   Date for PT Re-Evaluation 09/30/17   Authorization Type Aetna Medicare   PT Start Time 0750   PT Stop Time 0830   PT Time Calculation (min) 40 min   Activity Tolerance Patient tolerated treatment well   Behavior During Therapy [facility] for tasks assessed/performed      Past Medical History:  Diagnosis Date  . Arthritis   . Hyperlipidemia     Past Surgical History:  Procedure Laterality Date  . KIDNEY DONATION    . knees scoped      There were no vitals filed for this visit.      Subjective Assessment - 08/27/17 0756    Subjective Feeling much better , little to no pain , mainly feels tight   Currently in Pain? No/denies                         [facility] Adult PT Treatment/Exercise - 08/27/17 0001      Shoulder Exercises: Standing   Other Standing Exercises Rockwood 1x10 green band then   x10 blue band     Shoulder Exercises: ROM/Strengthening   UBE (Upper Arm Bike) 4 min forw 4 min back L2     Shoulder Exercises: Body Blade   ABduction 15 seconds;3 reps   External Rotation 15 seconds;3 reps     Ultrasound   Ultrasound Location Lt shoulder   Ultrasound Parameters 100% ! MHz 1.5 Wcm2   Ultrasound Goals Pain     Manual Therapy   Soft tissue mobilization posterior aspect shoulder /scapula    kineseotape  Over infraspinatus and rhomboids no tension with shoulder across front chest stretch            PT Education - 08/27/17 0845    Education provided Yes   Education Details issued blue band, modifications to decr pain or tightness   Person(s) Educated Patient   Methods Explanation;Demonstration;Tactile cues;Verbal cues   Comprehension Verbalized understanding;Returned demonstration          PT Short Term Goals - 08/25/17 0836      PT SHORT TERM GOAL #1   Title He will be independent with inital HEp   Status Achieved     PT SHORT TERM GOAL #2   Title He will report pain decreased 30% or more with using arm for lfting and reaching   Status Achieved     PT SHORT TERM GOAL #3   Title He will improve active IR to 30 degrees or more with decr pain.    Status Achieved           PT Long Term Goals - 08/07/17 0955      PT LONG TERM GOAL #1   Title He will be independent in all hEP issued   Time 8   Period Weeks   Status New     PT LONG TERM GOAL #2   Title FOTO score imrove to < 20% limited   Time 8   Period Weeks   Status New     PT LONG TERM GOAL #3  Title he will be able to reach overhead with 1/10 max pain to access cabinets    Time 8   Period Weeks   Status New     PT LONG TERM GOAL #4   Baseline He will be able to lift 20 pounds floor to waist with no pain   Time 8   Period Weeks   Status New     PT LONG TERM GOAL #5   Title He will be able to drive for job with no Lt shoulder pain   Time 8   Period Weeks   Status New               Plan - 08/27/17 0757    Clinical Impression Statement Pleased with progress . Essentially no pain with activity though sense of tightness. Progress exercise in gym   PT Treatment/Interventions Cryotherapy;Ultrasound;Taping;Manual techniques;Therapeutic exercise;Therapeutic activities;Dry needling;Patient/family education   PT Next Visit Plan  manual for soft tissue pain, [place] , possibly increase  band exer   PT Home Exercise Plan ice and rest arm, isometrics, rockwood , cross shoulder stretch   Consulted and Agree with Plan of Care Patient      Patient will benefit from skilled therapeutic intervention in order to improve the following deficits  and impairments:  Pain, Impaired UE functional use, Decreased range of motion, Decreased activity tolerance, Increased muscle spasms  Visit Diagnosis: Stiffness of left shoulder, not elsewhere classified  Other muscle spasm  Acute pain of left shoulder     Problem List Patient Active Problem List   Diagnosis Date Noted  . Hyperlipidemia 07/21/2017  . Gouty arthritis 06/03/2016  . Osteoarthritis of knee 06/03/2016    Caprice RedChasse, Zelphia Glover M  PT 08/27/2017, 8:47 AM  [facility] [address] [place], [place], [address] Phone: [phone]   Fax:  [phone]  Name: Troy SineRichard Lee Lucero MRN: [id number] Date of Birth: [birth year]

## 2017-08-31 ENCOUNTER — Other Ambulatory Visit: Payer: Self-pay | Admitting: Family Medicine

## 2017-08-31 DIAGNOSIS — S4990XA Unspecified injury of shoulder and upper arm, unspecified arm, initial encounter: Secondary | ICD-10-CM

## 2017-08-31 DIAGNOSIS — M7542 Impingement syndrome of left shoulder: Secondary | ICD-10-CM

## 2017-09-01 ENCOUNTER — Ambulatory Visit: Payer: Medicare HMO

## 2017-09-01 DIAGNOSIS — M25512 Pain in left shoulder: Secondary | ICD-10-CM | POA: Diagnosis not present

## 2017-09-01 DIAGNOSIS — M62838 Other muscle spasm: Secondary | ICD-10-CM

## 2017-09-01 DIAGNOSIS — M25612 Stiffness of left shoulder, not elsewhere classified: Secondary | ICD-10-CM

## 2017-09-01 NOTE — Therapy (Signed)
[place], [place], [address] Phone: [phone]   Fax:  [phone]  Physical Therapy Treatment  Patient Details  Name: Joseph Orr MRN: [id number] Date of Birth: 1951-09-21 Referring Provider: Betty Martinique, MD  Encounter Date: 09/01/2017      PT End of Session - 09/01/17 0801    Visit Number 7   Number of Visits 16   Date for PT Re-Evaluation 09/30/17   Authorization Type Aetna Medicare   PT Start Time [id number]   PT Stop Time 0840   PT Time Calculation (min) 45 min   Activity Tolerance Patient tolerated treatment well   Behavior During Therapy [facility] for tasks assessed/performed      Past Medical History:  Diagnosis Date  . Arthritis   . Hyperlipidemia     Past Surgical History:  Procedure Laterality Date  . KIDNEY DONATION    . knees scoped      There were no vitals filed for this visit.      Subjective Assessment - 09/01/17 0759    Subjective Tried a third set on Sunday and began to feel pain so stopped and did not exercise yesterday. No pain just feels tight   Patient Stated Goals He would like to get rid of pain and improve strength   Currently in Pain? No/denies   Pain Descriptors / Indicators Tightness                         [facility] Adult PT Treatment/Exercise - 09/01/17 0001      Self-Care   Other Self-Care Comments  Discussed tai chi for OA and balance and how this may benefit OA knee and overall movement.      Shoulder Exercises: ROM/Strengthening   UBE (Upper Arm Bike) 4 min forw 4 min back L3     Ultrasound   Ultrasound Location Lt shoulder    Ultrasound Parameters 100% 1.4 Wcm2 1MHz   Ultrasound Goals Pain     Manual Therapy   Manual therapy comments gentle distraction , AP and PA glides  Gr 3-4    Soft tissue mobilization posterior / anterior aspect shoulder /scapula with and without tool      Rockwood blue band 2x10 reps without pain.               PT Short Term Goals - 08/25/17 0836      PT SHORT TERM GOAL #1   Title He will be independent with inital HEp   Status Achieved     PT SHORT TERM GOAL #2   Title He will report pain decreased 30% or more with using arm for lfting and reaching   Status Achieved     PT SHORT TERM GOAL #3   Title He will improve active IR to 30 degrees or more with decr pain.    Status Achieved           PT Long Term Goals - 09/01/17 0802      PT LONG TERM GOAL #1   Title He will be independent in all hEP issued   Status On-going     PT LONG TERM GOAL #2   Title FOTO score imrove to < 20% limited   Baseline 15% improved   Status Partially Met     PT LONG TERM GOAL #3   Title he will be able to reach overhead with 1/10 max pain to access cabinets  Baseline occasional pain   Status Partially Met     PT LONG TERM GOAL #4   Baseline He will be able to lift 20 pounds floor to waist with no pain   Status Unable to assess     PT LONG TERM GOAL #5   Title He will be able to drive for job with no Lt shoulder pain   Baseline occasional soreness with driving for a couple of hours   Status Partially Met               Plan - 09-05-2017 0801    Clinical Impression Statement Improving but needs to progrees as pain allows and he appears to be aware of when to stop or advance.  He is able to reach overhead and hold arm in air without incr pain. Appears fatigue may contribute to symptom s  now. as tightness / soreness for prolonged activity such as driving.    PT Treatment/Interventions Cryotherapy;Ultrasound;Taping;Manual techniques;Therapeutic exercise;Therapeutic activities;Dry needling;Patient/family education   PT Next Visit Plan  manual for soft tissue pain, [place] , possibly increase  band exer   PT Home Exercise Plan ice and rest arm, isometrics, rockwood , cross shoulder stretch   Consulted and Agree with Plan of Care Patient      Patient will benefit from skilled  therapeutic intervention in order to improve the following deficits and impairments:  Pain, Impaired UE functional use, Decreased range of motion, Decreased activity tolerance, Increased muscle spasms  Visit Diagnosis: Stiffness of left shoulder, not elsewhere classified  Other muscle spasm  Acute pain of left shoulder       G-Codes - September 05, 2017 0813    Functional Assessment Tool Used (Outpatient Only) FOTO 29% limited   Functional Limitation Carrying, moving and handling objects   Carrying, Moving and Handling Objects Current Status (L9444) At least 20 percent but less than 40 percent impaired, limited or restricted   Carrying, Moving and Handling Objects Goal Status (Q1901) At least 1 percent but less than 20 percent impaired, limited or restricted      Problem List Patient Active Problem List   Diagnosis Date Noted  . Hyperlipidemia 07/21/2017  . Gouty arthritis 06/03/2016  . Osteoarthritis of knee 06/03/2016    Darrel Hoover  PT 09/05/17, 8:53 AM  [facility] [address] [place], [place], [address] Phone: [phone]   Fax:  [phone]  Name: Joseph Orr MRN: [id number] Date of Birth: 1951/07/30

## 2017-09-03 ENCOUNTER — Ambulatory Visit: Payer: Medicare HMO | Attending: Family Medicine

## 2017-09-03 DIAGNOSIS — M62838 Other muscle spasm: Secondary | ICD-10-CM | POA: Insufficient documentation

## 2017-09-03 DIAGNOSIS — M25612 Stiffness of left shoulder, not elsewhere classified: Secondary | ICD-10-CM | POA: Insufficient documentation

## 2017-09-03 DIAGNOSIS — M25512 Pain in left shoulder: Secondary | ICD-10-CM | POA: Diagnosis not present

## 2017-09-03 NOTE — Therapy (Addendum)
[place], [place], [address] Phone: [phone]   Fax:  [phone]  Physical Therapy Treatment/Discharge  Patient Details  Name: Joseph Orr MRN: [id number] Date of Birth: 1951-01-21 Referring Provider: Betty Martinique, MD  Encounter Date: 09/03/2017      PT End of Session - 09/03/17 0756    Visit Number 8   Number of Visits 16   Date for PT Re-Evaluation 09/30/17   Authorization Type [employer]   PT Start Time 6962   PT Stop Time 0838   PT Time Calculation (min) 44 min   Activity Tolerance Patient tolerated treatment well   Behavior During Therapy [facility] for tasks assessed/performed      Past Medical History:  Diagnosis Date  . Arthritis   . Hyperlipidemia     Past Surgical History:  Procedure Laterality Date  . KIDNEY DONATION    . knees scoped      There were no vitals filed for this visit.      Subjective Assessment - 09/03/17 0758    Subjective Tight but trying to safely push limits . No pain   Currently in Pain? No/denies                         [facility] Adult PT Treatment/Exercise - 09/03/17 0001      Therapeutic Activites    Therapeutic Activities Lifting   Lifting Lifting floor to waist with max 60 pounds but safe level appears to be 40-50 pounds as control is very goos . Waist to overhead max is 40 pounds  but safe level is 25 pounds as control and no sholder strain with this lift. He iappears safe with single arm lifting to shelf with 7 pounds  with 10 pounds max.   Single arm lift from floor appears 20 pounds is safe with 25 pounds max.  He will incorporate these lavels with sorkout in gym.      Shoulder Exercises: ROM/Strengthening   UBE (Upper Arm Bike) 4 min forw 4 min back L3                  PT Short Term Goals - 08/25/17 0836      PT SHORT TERM GOAL #1   Title He will be independent with inital HEp   Status Achieved     PT SHORT  TERM GOAL #2   Title He will report pain decreased 30% or more with using arm for lfting and reaching   Status Achieved     PT SHORT TERM GOAL #3   Title He will improve active IR to 30 degrees or more with decr pain.    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 Sinus congestion limited his activity today   PT Treatment/Interventions Cryotherapy;Ultrasound;Taping;Manual techniques;Therapeutic exercise;Therapeutic activities;Dry needling;Patient/family education   PT Next Visit Plan Continue HEP and progress on own , DC in 2-3 weeks if he does not return   PT Home Exercise Plan ice and rest arm, isometrics, [place]wood , cross shoulder stretch, lifting at gyme 2 and one arm different heights   Consulted and Agree with Plan of Care Patient      Patient will benefit from skilled therapeutic intervention in order to improve the following deficits and impairments:  Pain, Impaired UE functional use, Decreased range of motion, Decreased activity tolerance, Increased muscle  spasms  Visit Diagnosis: Stiffness of left shoulder, not elsewhere classified  Other muscle spasm  Acute pain of left shoulder     Problem List Patient Active Problem List   Diagnosis Date Noted  . Hyperlipidemia 07/21/2017  . Gouty arthritis 06/03/2016  . Osteoarthritis of knee 06/03/2016    Darrel Hoover  PT 09/03/2017, 8:44 AM  [facility] [address] [place], [place], [address] Phone: [phone]   Fax:  [phone]  Name: Lenix Kidd MRN: [id number] Date of Birth: 09-28-1951  PHYSICAL THERAPY DISCHARGE SUMMARY  Visits from Start of Care: 8  Current functional level related to goals / functional outcomes: See above. We agreed if he did not return he wold be discharged   Remaining deficits: See above   Education / Equipment: HEP  Plan: Patient agrees to discharge.  Patient goals were partially met. Patient is being discharged due to being pleased with the current functional level.  ?????    Noralee Stain  PT   10/06/17

## 2017-10-05 DIAGNOSIS — M720 Palmar fascial fibromatosis [Dupuytren]: Secondary | ICD-10-CM | POA: Diagnosis not present

## 2017-10-07 DIAGNOSIS — M720 Palmar fascial fibromatosis [Dupuytren]: Secondary | ICD-10-CM | POA: Diagnosis not present

## 2017-10-14 DIAGNOSIS — M720 Palmar fascial fibromatosis [Dupuytren]: Secondary | ICD-10-CM | POA: Diagnosis not present

## 2017-10-22 ENCOUNTER — Other Ambulatory Visit: Payer: Self-pay | Admitting: Family Medicine

## 2017-10-22 DIAGNOSIS — M109 Gout, unspecified: Secondary | ICD-10-CM

## 2017-11-23 ENCOUNTER — Ambulatory Visit (INDEPENDENT_AMBULATORY_CARE_PROVIDER_SITE_OTHER): Payer: Medicare HMO | Admitting: Family Medicine

## 2017-11-23 ENCOUNTER — Encounter: Payer: Self-pay | Admitting: Family Medicine

## 2017-11-23 VITALS — BP 130/80 | HR 87 | Temp 98.4°F | Wt 222.7 lb

## 2017-11-23 DIAGNOSIS — R0789 Other chest pain: Secondary | ICD-10-CM | POA: Diagnosis not present

## 2017-11-23 DIAGNOSIS — K219 Gastro-esophageal reflux disease without esophagitis: Secondary | ICD-10-CM | POA: Diagnosis not present

## 2017-11-23 DIAGNOSIS — R110 Nausea: Secondary | ICD-10-CM | POA: Diagnosis not present

## 2017-11-23 MED ORDER — OMEPRAZOLE 20 MG PO CPDR: 20.0000 mg | DELAYED_RELEASE_CAPSULE | Freq: Every day | ORAL | 3 refills | Status: DC

## 2017-11-23 MED ORDER — ONDANSETRON HCL 4 MG PO TABS: 4.0000 mg | ORAL_TABLET | Freq: Three times a day (TID) | ORAL | 0 refills | Status: DC | PRN

## 2017-11-23 NOTE — Patient Instructions (Addendum)
Food Choices for Gastroesophageal Reflux Disease, Adult When you have gastroesophageal reflux disease (GERD), the foods you eat and your eating habits are very important. Choosing the right foods can help ease your discomfort. What guidelines do I need to follow?  Choose fruits, vegetables, whole grains, and low-fat dairy products.  Choose low-fat meat, fish, and poultry.  Limit fats such as oils, salad dressings, butter, nuts, and avocado.  Keep a food diary. This helps you identify foods that cause symptoms.  Avoid foods that cause symptoms. These may be different for everyone.  Eat small meals often instead of 3 large meals a day.  Eat your meals slowly, in a place where you are relaxed.  Limit fried foods.  Cook foods using methods other than frying.  Avoid drinking alcohol.  Avoid drinking large amounts of liquids with your meals.  Avoid bending over or lying down until 2-3 hours after eating. What foods are not recommended? These are some foods and drinks that may make your symptoms worse: Vegetables Tomatoes. Tomato juice. Tomato and spaghetti sauce. Chili peppers. Onion and garlic. Horseradish. Fruits Oranges, grapefruit, and lemon (fruit and juice). Meats High-fat meats, fish, and poultry. This includes hot dogs, ribs, ham, sausage, salami, and bacon. Dairy Whole milk and chocolate milk. Sour cream. Cream. Butter. Ice cream. Cream cheese. Drinks Coffee and tea. Bubbly (carbonated) drinks or energy drinks. Condiments Hot sauce. Barbecue sauce. Sweets/Desserts Chocolate and cocoa. Donuts. Peppermint and spearmint. Fats and Oils High-fat foods. This includes [place] fries and potato chips. Other Vinegar. Strong spices. This includes black pepper, white pepper, red pepper, cayenne, curry powder, cloves, ginger, and chili powder. The items listed above may not be a complete list of foods and drinks to avoid. Contact your dietitian for more information. This  information is not intended to replace advice given to you by your health care provider. Make sure you discuss any questions you have with your health care provider. Document Released: 04/20/2012 Document Revised: 03/27/2016 Document Reviewed: 08/24/2013 Elsevier Interactive Patient Education  2017 Elsevier Inc.  Indigestion Indigestion is a feeling of pain, discomfort, burning, or fullness in the upper part of your belly (abdomen). It can come and go. It may occur often or rarely. Indigestion tends to happen while you are eating or right after you have finished eating. It may be worse at night and while bending over or lying down. Follow these instructions at home: Take these actions to lessen your pain or discomfort and to help avoid problems. Diet  Follow a diet as told by your doctor. You may need to avoid foods and drinks such as: ? Coffee and tea (with or without caffeine). ? Drinks that contain alcohol. ? Energy drinks and sports drinks. ? Carbonated drinks or sodas. ? Chocolate and cocoa. ? Peppermint and mint flavorings. ? Garlic and onions. ? Horseradish. ? Spicy and acidic foods, such as peppers, chili powder, curry powder, vinegar, hot sauces, and BBQ sauce. ? Citrus fruit juices and citrus fruits, such as oranges, lemons, and limes. ? Tomato-based foods, such as red sauce, chili, salsa, and pizza with red sauce. ? Fried and fatty foods, such as donuts, french fries, potato chips, and high-fat dressings. ? High-fat meats, such as hot dogs, rib eye steak, sausage, ham, and bacon. ? High-fat dairy items, such as whole milk, butter, and cream cheese.  Eat small meals often. Avoid eating large meals.  Avoid drinking large amounts of liquid with your meals.  Avoid eating meals during the 2-3 hours  before bedtime.  Avoid lying down right after you eat.  Do not exercise right after you eat. General instructions  Pay attention to any changes in your symptoms.  Take  over-the-counter and prescription medicines only as told by your doctor. Do not take aspirin, ibuprofen, or other NSAIDs unless your doctor says it is okay.  Do not use any tobacco products, including cigarettes, chewing tobacco, and e-cigarettes. If you need help quitting, ask your doctor.  Wear loose clothes. Do not wear anything tight around your waist.  Raise (elevate) the head of your bed about 6 inches (15 cm).  Try to lower your stress. If you need help doing this, ask your doctor.  If you are overweight, lose an amount of weight that is healthy for you. Ask your doctor about a safe weight loss goal.  Keep all follow-up visits as told by your doctor. This is important. Contact a doctor if:  You have new symptoms.  You lose weight and you do not know why it is happening.  You have trouble swallowing, or it hurts to swallow.  Your symptoms do not get better with treatment.  Your symptoms last for more than two days.  You have a fever.  You throw up (vomit). Get help right away if:  You have pain in your arms, neck, jaw, teeth, or back.  You feel sweaty, dizzy, or light-headed.  You pass out (faint).  You have chest pain or shortness of breath.  You cannot stop throwing up, or you throw up blood.  Your poop (stool) is bloody or black.  You have very bad pain in your belly. This information is not intended to replace advice given to you by your health care provider. Make sure you discuss any questions you have with your health care provider. Document Released: 11/22/2010 Document Revised: 03/27/2016 Document Reviewed: 02/14/2015 Elsevier Interactive Patient Education  [employer]2018 Elsevier Inc.

## 2017-11-23 NOTE — Progress Notes (Signed)
Subjective:    Patient ID: Joseph Orr, male    DOB: 01/28/1951, 67 y.o.   MRN: [id number]  No chief complaint on file.   HPI Patient was seen today for acute concern.  Pt states on Friday he had nausea and vomiting after drinking milk and grapefruit juice.  Pt states he also noted a cough and "chest discomfort/tightness".  Pt states he felt "good" on Sunday, but the cough, nausea, and chest tightness returned this am.  Pt states in the past he had issues with milk causing nausea and making his stomach feel like it was in knots.  Pt also endorses grapefruit juice causing upset stomach.  Pt denies diaphoresis, numbness or tingling in face or upper extremities, chest pain, headache, changes in vision, weakness in upper or lower extremities.  Patient states he just wanted to be checked out as he plans on traveling to [place] on Friday for his father's [employer].  Past Medical History:  Diagnosis Date  . Arthritis   . Hyperlipidemia     Allergies  Allergen Reactions  . Percocet [Oxycodone-Acetaminophen]     vomiting    ROS General: Denies fever, chills, night sweats, changes in weight, changes in appetite HEENT: Denies headaches, ear pain, changes in vision, rhinorrhea, sore throat CV: Denies CP, palpitations, SOB, orthopnea  +chest tightness/discomfort Pulm: Denies SOB, wheezing  +cough GI: Denies abdominal pain, vomiting, diarrhea, constipation  +nausea GU: Denies dysuria, hematuria, frequency, vaginal discharge Msk: Denies muscle cramps, joint pains Neuro: Denies weakness, numbness, tingling Skin: Denies rashes, bruising Psych: Denies depression, anxiety, hallucinations     Objective:    Blood pressure 130/80, pulse 87, temperature 98.4 F (36.9 C), temperature source Oral, weight 222 lb 11.2 oz (101 kg).   Gen. Pleasant, well-nourished, in no distress, normal affect   HEENT: [place]/AT, face symmetric, no scleral icterus, PERRLA, nares patent without drainage,  pharynx without erythema or exudate.  No JVD. Lungs: no accessory muscle use, CTAB, no wheezes or rales Cardiovascular: RRR, no m/r/g, no peripheral edema.  EKG obtained.  No TTP of chest and abdomen. Abdomen: BS present, soft, NT/ND Neuro:  A&Ox3, CN II-XII intact, normal gait   Wt Readings from Last 3 Encounters:  11/23/17 222 lb 11.2 oz (101 kg)  08/03/17 216 lb 4 oz (98.1 kg)  07/22/17 214 lb 2 oz (97.1 kg)    Lab Results  Component Value Date   GLUCOSE 99 07/22/2017   CHOL 210 (H) 07/22/2017   TRIG 149.0 07/22/2017   HDL 64.00 07/22/2017   LDLCALC 116 (H) 07/22/2017   ALT 41 07/22/2017   AST 64 (H) 07/22/2017   NA 141 07/22/2017   K 4.2 07/22/2017   CL 105 07/22/2017   CREATININE 1.24 07/22/2017   BUN 11 07/22/2017   CO2 27 07/22/2017   PSA 1.21 07/22/2017    Assessment/Plan:  Chest discomfort  -likely 2/2 reflux  -EKG obtained and reassuring.  EKG with sinus rhythm, VR ~75, diaphasic p wave noted in V1 (consider L atrial abnormality, but no wide or double peaked p-wave in leads 1 & 2), no T wave inversion or ST elevation noted.  Similar in appearance to EKG obtained on 01/15/12. - Plan: EKG 12-Lead -Patient given ED for RTC precautions.  Nausea -likely 2/2 reflux. - Plan: ondansetron (ZOFRAN) 4 MG tablet  Gastroesophageal reflux disease, esophagitis presence not specified  - Plan: omeprazole (PRILOSEC) 20 MG capsule.  Discussed use x 3 months. Discussed avoiding foods that trigger symptoms.  Follow-up  in 1 month or sooner if needed  Abbe AmsterdamShannon Tatem Fesler, MD

## 2018-01-22 ENCOUNTER — Other Ambulatory Visit: Payer: Self-pay | Admitting: Family Medicine

## 2018-01-22 DIAGNOSIS — M109 Gout, unspecified: Secondary | ICD-10-CM

## 2018-03-19 ENCOUNTER — Other Ambulatory Visit: Payer: Self-pay | Admitting: Family Medicine

## 2018-03-19 DIAGNOSIS — K219 Gastro-esophageal reflux disease without esophagitis: Secondary | ICD-10-CM

## 2018-03-19 NOTE — Telephone Encounter (Signed)
I do not think I have prescribed this medication. Follow up is important to determine if he needs to continue medication or if dose needs to be adjusted.  Also Prilosec (Omeprazole) is OTC.  Thanks, BJ

## 2018-03-19 NOTE — Telephone Encounter (Signed)
Patient is overdue for follow-up. Per last notes was only supposed to take omeprazole x3 months.  Called patient to schedule follow-up appt, he states he is going to [place] "for a while" soon and thought he was told he could stay on omeprazole x6 months and then follow-up. Please advise refill.

## 2018-04-06 ENCOUNTER — Encounter: Payer: Self-pay | Admitting: Family Medicine

## 2018-04-06 ENCOUNTER — Ambulatory Visit: Payer: Medicare HMO | Admitting: Family Medicine

## 2018-04-06 VITALS — BP 120/70 | HR 88 | Temp 98.1°F | Resp 16 | Ht 69.0 in | Wt 219.2 lb

## 2018-04-06 DIAGNOSIS — K219 Gastro-esophageal reflux disease without esophagitis: Secondary | ICD-10-CM | POA: Insufficient documentation

## 2018-04-06 DIAGNOSIS — M109 Gout, unspecified: Secondary | ICD-10-CM

## 2018-04-06 DIAGNOSIS — J449 Chronic obstructive pulmonary disease, unspecified: Secondary | ICD-10-CM | POA: Insufficient documentation

## 2018-04-06 DIAGNOSIS — R740 Nonspecific elevation of levels of transaminase and lactic acid dehydrogenase [LDH]: Secondary | ICD-10-CM | POA: Diagnosis not present

## 2018-04-06 DIAGNOSIS — R7401 Elevation of levels of liver transaminase levels: Secondary | ICD-10-CM

## 2018-04-06 LAB — HEPATIC FUNCTION PANEL
ALT: 38 U/L (ref 0–53)
AST: 54 U/L — ABNORMAL HIGH (ref 0–37)
Albumin: 4.2 g/dL (ref 3.5–5.2)
Alkaline Phosphatase: 68 U/L (ref 39–117)
BILIRUBIN DIRECT: 0.2 mg/dL (ref 0.0–0.3)
BILIRUBIN TOTAL: 0.5 mg/dL (ref 0.2–1.2)
Total Protein: 6.6 g/dL (ref 6.0–8.3)

## 2018-04-06 MED ORDER — ALBUTEROL SULFATE HFA 108 (90 BASE) MCG/ACT IN AERS: 2.0000 | INHALATION_SPRAY | RESPIRATORY_TRACT | 3 refills | Status: DC | PRN

## 2018-04-06 MED ORDER — ALLOPURINOL 100 MG PO TABS: 100.0000 mg | ORAL_TABLET | Freq: Every day | ORAL | 3 refills | Status: DC

## 2018-04-06 MED ORDER — OMEPRAZOLE 20 MG PO CPDR: 20.0000 mg | DELAYED_RELEASE_CAPSULE | Freq: Every day | ORAL | 3 refills | Status: DC

## 2018-04-06 NOTE — Assessment & Plan Note (Signed)
Well-controlled with Allopurinol 100 mg daily. No changes to current management. Follow-up annually.

## 2018-04-06 NOTE — Assessment & Plan Note (Signed)
No changes in current management, Albuterol inhaler 2 puff every 4 hours as needed. Lung auscultation today negative for wheezing, rales, or rhonchi.  PFTs will be arranged up with recommendations will be given accordingly. Instructed about warning signs.

## 2018-04-06 NOTE — Patient Instructions (Signed)
A few things to remember from today's visit:   Elevated ALT measurement - Plan: Hepatic function panel  Gastroesophageal reflux disease, esophagitis presence not specified - Plan: omeprazole (PRILOSEC) 20 MG capsule  Gouty arthritis - Plan: allopurinol (ZYLOPRIM) 100 MG tablet  Chronic obstructive pulmonary disease, unspecified COPD type (HCC)   Please be sure medication list is accurate. If a new problem present, please set up appointment sooner than planned today.

## 2018-04-06 NOTE — Assessment & Plan Note (Signed)
Symptoms have resolved. Instructed to continue Omeprazole 20 mg daily as needed. GERD precautions also recommended. We discussed some side effects of PPIs. Follow-up annually, before if needed.

## 2018-04-06 NOTE — Progress Notes (Signed)
HPI:   [name] is a 67 y.o. male, who is here today for follow up.   He was last seen in 08/2017 for acute visit.  GERD: She was started on omeprazole 20 mg in 11/2017 after he presented with episode of heartburn, nausea, and acid reflux. He ran out of medication about 2 weeks ago and has not had symptoms.  Symptoms were exacerbated by grape juice and some foods.    History of elevated AST. He has not decreased beer intake, reported 2-3 beers daily.   Lab Results  Component Value Date   ALT 41 07/22/2017   AST 64 (H) 07/22/2017   ALKPHOS 67 07/22/2017   BILITOT 0.8 07/22/2017     Denies abdominal pain, nausea, vomiting, changes in bowel habits, blood in stool or melena.   Gout: Currently he is on allopurinol 100 mg daily. He takes colchicine 0.6 mg twice daily as needed. He has not had a gout attack in about 5 years. He is tolerating medication well, no side effects are reported.  He also has history of knee OA.    Reporting history of mild COPD, he is currently on albuterol inhaler, which he uses as needed. He is having intermittent productive cough, wheezing, and exertional dyspnea. Symptoms are alleviated by albuterol inhaler and exacerbated by exercise. No associated chest pain, diaphoresis, or palpitations.  He is using Albuterol from 0-4 times per week.  Former smoker.   Review of Systems  Constitutional: Negative for activity change, appetite change, fatigue and fever.  HENT: Negative for sore throat and trouble swallowing.   Eyes: Negative for redness and visual disturbance.  Respiratory: Positive for cough, shortness of breath and wheezing.   Cardiovascular: Negative for chest pain, palpitations and leg swelling.  Gastrointestinal: Negative for abdominal pain, nausea and vomiting.  Genitourinary: Negative for decreased urine volume and hematuria.  Musculoskeletal: Negative for gait problem and myalgias.  Neurological:  Negative for syncope, weakness and headaches.     Current Outpatient Medications on File Prior to Visit  Medication Sig Dispense Refill  . colchicine 0.6 MG tablet Take 1 tablet (0.6 mg total) by mouth 2 (two) times daily as needed. 45 tablet 1  . fluticasone (FLONASE) 50 MCG/ACT nasal spray Place 2 sprays into both nostrils daily. (Patient taking differently: Place 2 sprays into both nostrils as needed. ) 16 g 3   No current facility-administered medications on file prior to visit.      Past Medical History:  Diagnosis Date  . Arthritis   . Hyperlipidemia    Allergies  Allergen Reactions  . Oxycodone-Acetaminophen Nausea And Vomiting    vomiting    Social History   Socioeconomic History  . Marital status: Married    Spouse name: Not on file  . Number of children: Not on file  . Years of education: Not on file  . Highest education level: Not on file  Occupational History  . Not on file  Social Needs  . Financial resource strain: Not on file  . Food insecurity:    Worry: Not on file    Inability: Not on file  . Transportation needs:    Medical: Not on file    Non-medical: Not on file  Tobacco Use  . Smoking status: Former Smoker    Last attempt to quit: 11/03/1998    Years since quitting: 19.4  . Smokeless tobacco: Never Used  Substance and Sexual Activity  . Alcohol use: Not on file  .  Drug use: Not on file  . Sexual activity: Not on file  Lifestyle  . Physical activity:    Days per week: Not on file    Minutes per session: Not on file  . Stress: Not on file  Relationships  . Social connections:    Talks on phone: Not on file    Gets together: Not on file    Attends religious service: Not on file    Active member of club or organization: Not on file    Attends meetings of clubs or organizations: Not on file    Relationship status: Not on file  Other Topics Concern  . Not on file  Social History Narrative  . Not on file    Vitals:   04/06/18 1423    BP: 120/70  Pulse: 88  Resp: 16  Temp: 98.1 F (36.7 C)  SpO2: 98%   Body mass index is 32.38 kg/m.    Physical Exam  Nursing note and vitals reviewed. Constitutional: He is oriented to person, place, and time. He appears well-developed. No distress.  HENT:  Head: Normocephalic and atraumatic.  Mouth/Throat: Oropharynx is clear and moist and mucous membranes are normal.  Eyes: Pupils are equal, round, and reactive to light. Conjunctivae are normal.  Cardiovascular: Normal rate and regular rhythm.  No murmur heard. Pulses:      Dorsalis pedis pulses are 2+ on the right side, and 2+ on the left side.  Respiratory: Effort normal and breath sounds normal. No respiratory distress.  GI: Soft. He exhibits no mass. There is no hepatomegaly. There is no tenderness.  Musculoskeletal: He exhibits edema (Trace pitting edema LE, bilateral). He exhibits no tenderness.  Lymphadenopathy:    He has no cervical adenopathy.  Neurological: He is alert and oriented to person, place, and time. He has normal strength.  Skin: Skin is warm. No erythema.  Psychiatric: He has a normal mood and affect. Cognition and memory are normal.  Well groomed, good eye contact.       ASSESSMENT AND PLAN:   [name] was seen today for follow-up.  Orders Placed This Encounter  Procedures  . Hepatic function panel  . Pulmonary Function Test    GERD (gastroesophageal reflux disease) Symptoms have resolved. Instructed to continue Omeprazole 20 mg daily as needed. GERD precautions also recommended. We discussed some side effects of PPIs. Follow-up annually, before if needed.  Gouty arthritis Well-controlled with Allopurinol 100 mg daily. No changes to current management. Follow-up annually.  COPD (chronic obstructive pulmonary disease) (HCC) No changes in current management, Albuterol inhaler 2 puff every 4 hours as needed. Lung auscultation today negative for wheezing, rales, or  rhonchi.  PFTs will be arranged up with recommendations will be given accordingly. Instructed about warning signs.   Elevated AST (SGOT)  Mild and asymptomatic. We will recheck today and further recommendation will be given accordingly. Recommend decreasing number of beer intake.       Betty G. SwazilandJordan, MD  [facility]. [place] office.

## 2018-04-22 ENCOUNTER — Ambulatory Visit (INDEPENDENT_AMBULATORY_CARE_PROVIDER_SITE_OTHER): Payer: Medicare HMO | Admitting: Internal Medicine

## 2018-04-22 DIAGNOSIS — J449 Chronic obstructive pulmonary disease, unspecified: Secondary | ICD-10-CM

## 2018-04-22 LAB — PULMONARY FUNCTION TEST
DL/VA % pred: 129 %
DL/VA: 4.75 ml/min/mmHg/L
DLCO unc % pred: 148 %
DLCO unc: 26.01 ml/min/mmHg
FEF 25-75 PRE: 3.33 L/s
FEF 25-75 Post: 4.12 L/sec
FEF2575-%CHANGE-POST: 23 %
FEF2575-%PRED-POST: 245 %
FEF2575-%Pred-Pre: 197 %
FEV1-%Change-Post: 4 %
FEV1-%PRED-POST: 165 %
FEV1-%PRED-PRE: 159 %
FEV1-POST: 3.45 L
FEV1-PRE: 3.31 L
FEV1FVC-%CHANGE-POST: 4 %
FEV1FVC-%Pred-Pre: 112 %
FEV6-%CHANGE-POST: 0 %
FEV6-%PRED-POST: 151 %
FEV6-%PRED-PRE: 151 %
FEV6-PRE: 3.99 L
FEV6-Post: 3.98 L
FEV6FVC-%Change-Post: 0 %
FEV6FVC-%PRED-PRE: 107 %
FEV6FVC-%Pred-Post: 107 %
FVC-%CHANGE-POST: 0 %
FVC-%PRED-POST: 140 %
FVC-%Pred-Pre: 140 %
FVC-Post: 3.99 L
FVC-Pre: 3.99 L
POST FEV1/FVC RATIO: 86 %
POST FEV6/FVC RATIO: 100 %
PRE FEV6/FVC RATIO: 100 %
Pre FEV1/FVC ratio: 83 %
RV % PRED: 100 %
RV: 1.78 L
TLC % pred: 123 %
TLC: 5.89 L

## 2018-04-22 NOTE — Progress Notes (Signed)
PFT completed today. 04/22/18  

## 2018-04-30 ENCOUNTER — Other Ambulatory Visit: Payer: Self-pay | Admitting: *Deleted

## 2018-04-30 DIAGNOSIS — J449 Chronic obstructive pulmonary disease, unspecified: Secondary | ICD-10-CM

## 2018-04-30 DIAGNOSIS — R0602 Shortness of breath: Secondary | ICD-10-CM

## 2018-06-24 NOTE — Progress Notes (Signed)
Cardiology Office Note   Date:  06/25/2018   ID:  Joseph SineRichard Lee Pettie, DOB [birth year], MRN [id number]  PCP:  [place]il[place]Jordan, Betty G, MD  Cardiologist:   No primary care provider on file. Referring:  [place]ilandJordan, Betty G, MD  Chief Complaint  Patient presents with  . Cough      History of Present Illness: Joseph Orr is a 67 y.o. male who is referred by SwazilandJordan, Betty G, MD for evaluation of cough and dyspnea.     Patient has no past cardiac history.  He has really no significant medical history to speak of.  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  He is describing coughing.  He says this feels like a mucus in the back of his throat.  He coughing paroxysms in the morning.  He does not really bring up anything.  He feels that this has gotten a little bit better with Zyrtec.  He is had some improvement with albuterol.  He said it recently when he went back to [place] from once he came he actually had resolution of his symptoms and wonders if it could be allergies.  He is not describing shortness of breath.  He gets to the gym and rides exercise bike routinely.  He is actually lost about 8 pounds.  He denies any cardiovascular symptoms.  He said no chest pressure, neck or arm discomfort.  He is had no palpitations, presyncope or syncope.  He has had no PND or orthopnea    Past Medical History:  Diagnosis Date  . Arthritis   . Gout     Past Surgical History:  Procedure Laterality Date  . KIDNEY DONATION    . KNEE ARTHROSCOPY       Current Outpatient Medications  Medication Sig Dispense Refill  . Acetaminophen (TYLENOL PO) Take 1,000 mg by mouth daily.    Marland Kitchen. albuterol (PROVENTIL HFA;VENTOLIN HFA) 108 (90 Base) MCG/ACT inhaler Inhale 2 puffs into the lungs every 4 (four) hours as needed for wheezing or shortness of breath. 1 Inhaler 3  . allopurinol (ZYLOPRIM) 100 MG tablet Take 1 tablet (100 mg  total) by mouth daily. 90 tablet 3  . cetirizine (ZYRTEC) 10 MG tablet Take 10 mg by mouth daily.    Marland Kitchen. omeprazole (PRILOSEC) 20 MG capsule Take 1 capsule (20 mg total) by mouth daily. 30 capsule 3   No current facility-administered medications for this visit.     Allergies:   Oxycodone-acetaminophen    Social History:  The patient  reports that he quit smoking about 19 years ago. He has never used smokeless tobacco.   Family History:  The patient's family history includes Arthritis in his father and mother; Pneumonia in his father; Stroke in his brother.    ROS:  Please see the history of present illness.   Otherwise, review of systems are positive for none.   All other systems are reviewed and negative.    PHYSICAL EXAM: VS:  BP 138/84 (BP Location: Left Arm, Patient Position: Sitting, Cuff Size: Normal)   Pulse 80   Ht 5\' 9"  (1.753 m)   Wt 213 lb (96.6 kg)   BMI 31.45 kg/m  , BMI Body mass index is 31.45 kg/m. GENERAL:  Well appearing HEENT:  Pupils equal round and reactive, fundi not visualized, oral mucosa unremarkable NECK:  No jugular venous distention, waveform within normal limits, carotid upstroke  brisk and symmetric, no bruits, no thyromegaly LYMPHATICS:  No cervical, inguinal adenopathy LUNGS:  Clear to auscultation bilaterally BACK:  No CVA tenderness CHEST:  Unremarkable HEART:  PMI not displaced or sustained,S1 and S2 within normal limits, no S3, no S4, no clicks, no rubs, no murmurs ABD:  Flat, positive bowel sounds normal in frequency in pitch, no bruits, no rebound, no guarding, no midline pulsatile mass, no hepatomegaly, no splenomegaly EXT:  2 plus pulses throughout, no edema, no cyanosis no clubbing   EKG:  EKG is not ordered today. The ekg ordered 11/23/17 demonstrates sinus rhythm, rate 60, axis within normal limits, intervals within normal limits, no acute ST-T wave changes.   Recent Labs: 07/22/2017: BUN 11; Creatinine, Ser 1.24; Potassium 4.2; Sodium  141 04/06/2018: ALT 38    Lipid Panel    Component Value Date/Time   CHOL 210 (H) 07/22/2017 1014   TRIG 149.0 07/22/2017 1014   HDL 64.00 07/22/2017 1014   CHOLHDL 3 07/22/2017 1014   VLDL 29.8 07/22/2017 1014   LDLCALC 116 (H) 07/22/2017 1014      Wt Readings from Last 3 Encounters:  06/25/18 213 lb (96.6 kg)  04/06/18 219 lb 4 oz (99.5 kg)  11/23/17 222 lb 11.2 oz (101 kg)      Other studies Reviewed: Additional studies/ records that were reviewed today include: Labs, EKG. Review of the above records demonstrates:  Please see elsewhere in the note.     ASSESSMENT AND PLAN:  COUGH:   I do not suspect that this is related to any cardiac issues.  He has no significant risk factors.  He does not have any objective findings of obstructive coronary artery disease or symptoms consistent with this.  I would not suggest congestive heart failure as an etiology.  He seemed to improve with albuterol, Zyrtec and a change of location.  Given this I would pursue management of perhaps a sinus upper respiratory issue.  Less likely to be reflux.  No further cardiovascular testing is scheduled but I would be happy to see him back if there are further questions.     Current medicines are reviewed at length with the patient today.  The patient does not have concerns regarding medicines.  The following changes have been made:  no change  Labs/ tests ordered today include:  No orders of the defined types were placed in this encounter.    Disposition:   FU with me as needed.      Signed, Rollene Rotunda, MD  06/25/2018 4:21 PM    [facility]

## 2018-06-25 ENCOUNTER — Ambulatory Visit: Payer: Medicare HMO | Admitting: Cardiology

## 2018-06-25 ENCOUNTER — Encounter: Payer: Self-pay | Admitting: Cardiology

## 2018-06-25 VITALS — BP 138/84 | HR 80 | Ht 69.0 in | Wt 213.0 lb

## 2018-06-25 DIAGNOSIS — R05 Cough: Secondary | ICD-10-CM

## 2018-06-25 DIAGNOSIS — R059 Cough, unspecified: Secondary | ICD-10-CM

## 2018-06-25 NOTE — Patient Instructions (Signed)
Medication Instructions:  Continue current medications  If you need a refill on your cardiac medications before your next appointment, please call your pharmacy.  Labwork: None Ordered  Testing/Procedures: None Ordered  Follow-Up: Your physician wants you to follow-up in: As Needed.      Thank you for choosing [facility]!!       

## 2018-08-01 ENCOUNTER — Other Ambulatory Visit: Payer: Self-pay | Admitting: Family Medicine

## 2018-08-01 DIAGNOSIS — K219 Gastro-esophageal reflux disease without esophagitis: Secondary | ICD-10-CM

## 2018-08-10 ENCOUNTER — Ambulatory Visit (INDEPENDENT_AMBULATORY_CARE_PROVIDER_SITE_OTHER): Payer: Medicare HMO | Admitting: Family Medicine

## 2018-08-10 ENCOUNTER — Ambulatory Visit (INDEPENDENT_AMBULATORY_CARE_PROVIDER_SITE_OTHER): Payer: Medicare HMO

## 2018-08-10 ENCOUNTER — Encounter: Payer: Self-pay | Admitting: Family Medicine

## 2018-08-10 ENCOUNTER — Other Ambulatory Visit: Payer: Self-pay

## 2018-08-10 VITALS — BP 120/78 | HR 100 | Temp 98.3°F | Ht 69.0 in | Wt 209.8 lb

## 2018-08-10 DIAGNOSIS — R05 Cough: Secondary | ICD-10-CM | POA: Diagnosis not present

## 2018-08-10 DIAGNOSIS — R053 Chronic cough: Secondary | ICD-10-CM

## 2018-08-10 MED ORDER — HYDROCODONE-HOMATROPINE 5-1.5 MG/5ML PO SYRP: 5.0000 mL | ORAL_SOLUTION | Freq: Four times a day (QID) | ORAL | 0 refills | Status: AC | PRN

## 2018-08-10 NOTE — Progress Notes (Signed)
  Subjective:     Patient ID: Joseph Orr, male   DOB: 10/08/1951, 67 y.o.   MRN: [id number]  HPI Patient is an ex-smoker who was seen with some intermittent cough for over a year.  Quit smoking 1999.  Current cough has been especially been bad past 2 weeks.  Started with URI type symptoms.  Denies any fever.  No ACE inhibitor use.  No obvious GERD symptoms.  Does have some postnasal drip type symptoms.  Takes Zyrtec regularly.  Denies any fever, chills, dyspnea, chest pain, pleuritic pain, or hemoptysis.  No clear exacerbating factors.  No alleviating factors.  Recent travels.  He has had some weight loss but he states this is due to his efforts at exercising more.  Wt Readings from Last 3 Encounters:  08/10/18 209 lb 12.8 oz (95.2 kg)  06/25/18 213 lb (96.6 kg)  04/06/18 219 lb 4 oz (99.5 kg)     Past Medical History:  Diagnosis Date  . Arthritis   . Gout    Past Surgical History:  Procedure Laterality Date  . KIDNEY DONATION    . KNEE ARTHROSCOPY      reports that he quit smoking about 19 years ago. He has never used smokeless tobacco. His alcohol and drug histories are not on file. family history includes Arthritis in his father and mother; Pneumonia in his father; Stroke in his brother. Allergies  Allergen Reactions  . Oxycodone-Acetaminophen Nausea And Vomiting    vomiting     Review of Systems  Constitutional: Negative for appetite change, chills, fever and unexpected weight change.  HENT: Positive for postnasal drip. Negative for congestion, sinus pressure, sinus pain and sore throat.   Respiratory: Positive for cough. Negative for shortness of breath and wheezing.   Cardiovascular: Negative for chest pain, palpitations and leg swelling.  Hematological: Negative for adenopathy.       Objective:   Physical Exam  Constitutional: He appears well-developed and well-nourished.  HENT:  Right Ear: External ear normal.  Left Ear: External ear normal.   Mouth/Throat: No oropharyngeal exudate.  Neck: Neck supple.  Cardiovascular: Normal rate and regular rhythm.  Pulmonary/Chest: Effort normal and breath sounds normal. He has no wheezes. He has no rales.  Musculoskeletal: He exhibits no edema.  Lymphadenopathy:    He has no cervical adenopathy.       Assessment:     Chronic cough.  Possible postnasal drip related.  He has had some weight loss but he states due to his efforts.  Chest x-ray no acute abnormality and this will be over read by radiology    Plan:     -Given duration of cough will obtain chest x-ray-as above -Consider over-the-counter chlorpheniramine 4 mg nightly to help reduce postnasal drip and also add Flonase daily -Wrote for limited Hycodan cough syrup 1 teaspoon nightly for severe cough.  He states he is taking this in the past without difficulty.  He does have listed intolerance to oxycodone which caused nausea but he states he has been no take Hycodan or hydrocodone product -Avoid mint products  Kristian Covey MD [facility]

## 2018-08-10 NOTE — Patient Instructions (Signed)
Add Flonase once daily  Consider Chlorpheniramine 4 mg one at night to reduce postnasal drip  Follow up for any fever, shortness of breath, or persistent cough

## 2018-08-31 DIAGNOSIS — R69 Illness, unspecified: Secondary | ICD-10-CM | POA: Diagnosis not present

## 2018-10-27 ENCOUNTER — Other Ambulatory Visit: Payer: Self-pay | Admitting: Family Medicine

## 2018-10-27 DIAGNOSIS — K219 Gastro-esophageal reflux disease without esophagitis: Secondary | ICD-10-CM

## 2018-11-24 DIAGNOSIS — H2512 Age-related nuclear cataract, left eye: Secondary | ICD-10-CM | POA: Diagnosis not present

## 2018-11-24 DIAGNOSIS — H5703 Miosis: Secondary | ICD-10-CM | POA: Diagnosis not present

## 2018-12-09 DIAGNOSIS — H25812 Combined forms of age-related cataract, left eye: Secondary | ICD-10-CM | POA: Diagnosis not present

## 2018-12-09 DIAGNOSIS — H5703 Miosis: Secondary | ICD-10-CM | POA: Diagnosis not present

## 2018-12-09 DIAGNOSIS — H268 Other specified cataract: Secondary | ICD-10-CM | POA: Diagnosis not present

## 2019-02-02 DIAGNOSIS — G459 Transient cerebral ischemic attack, unspecified: Secondary | ICD-10-CM

## 2019-02-02 HISTORY — DX: Transient cerebral ischemic attack, unspecified: G45.9

## 2019-02-04 ENCOUNTER — Telehealth: Payer: Self-pay

## 2019-02-04 NOTE — Telephone Encounter (Signed)
Author phoned pt. to assess interest in scheduling virtual awv. No answer, author left detailed VM asking for return call.   

## 2019-03-03 DIAGNOSIS — I639 Cerebral infarction, unspecified: Secondary | ICD-10-CM

## 2019-03-03 HISTORY — DX: Cerebral infarction, unspecified: I63.9

## 2019-03-07 ENCOUNTER — Other Ambulatory Visit: Payer: Self-pay

## 2019-03-07 ENCOUNTER — Ambulatory Visit (INDEPENDENT_AMBULATORY_CARE_PROVIDER_SITE_OTHER): Payer: Medicare HMO | Admitting: Family Medicine

## 2019-03-07 ENCOUNTER — Other Ambulatory Visit (INDEPENDENT_AMBULATORY_CARE_PROVIDER_SITE_OTHER): Payer: Medicare HMO

## 2019-03-07 ENCOUNTER — Encounter: Payer: Self-pay | Admitting: Family Medicine

## 2019-03-07 VITALS — Resp 12

## 2019-03-07 DIAGNOSIS — R200 Anesthesia of skin: Secondary | ICD-10-CM

## 2019-03-07 DIAGNOSIS — I639 Cerebral infarction, unspecified: Secondary | ICD-10-CM | POA: Diagnosis not present

## 2019-03-07 DIAGNOSIS — E785 Hyperlipidemia, unspecified: Secondary | ICD-10-CM | POA: Diagnosis not present

## 2019-03-07 DIAGNOSIS — R202 Paresthesia of skin: Secondary | ICD-10-CM

## 2019-03-07 LAB — COMPREHENSIVE METABOLIC PANEL
ALT: 40 U/L (ref 0–53)
AST: 53 U/L — ABNORMAL HIGH (ref 0–37)
Albumin: 4.3 g/dL (ref 3.5–5.2)
Alkaline Phosphatase: 112 U/L (ref 39–117)
BUN: 7 mg/dL (ref 6–23)
CO2: 27 mEq/L (ref 19–32)
Calcium: 9.2 mg/dL (ref 8.4–10.5)
Chloride: 94 mEq/L — ABNORMAL LOW (ref 96–112)
Creatinine, Ser: 1.01 mg/dL (ref 0.40–1.50)
GFR: 73.51 mL/min (ref >60.00)
Glucose, Bld: 86 mg/dL (ref 70–99)
Potassium: 4.6 mEq/L (ref 3.5–5.1)
Sodium: 132 mEq/L — ABNORMAL LOW (ref 135–145)
Total Bilirubin: 1.2 mg/dL (ref 0.2–1.2)
Total Protein: 6.9 g/dL (ref 6.0–8.3)

## 2019-03-07 LAB — LIPID PANEL
Cholesterol: 149 mg/dL (ref 0–200)
HDL: 63.3 mg/dL (ref >39.00)
LDL Cholesterol: 70 mg/dL (ref 0–99)
NonHDL: 85.57
Total CHOL/HDL Ratio: 2
Triglycerides: 80 mg/dL (ref 0.0–149.0)
VLDL: 16 mg/dL (ref 0.0–40.0)

## 2019-03-07 LAB — CBC
HCT: 46.3 % (ref 39.0–52.0)
Hemoglobin: 15.9 g/dL (ref 13.0–17.0)
MCHC: 34.3 g/dL (ref 30.0–36.0)
MCV: 100.5 fl — ABNORMAL HIGH (ref 78.0–100.0)
Platelets: 160 10*3/uL (ref 150.0–400.0)
RBC: 4.6 Mil/uL (ref 4.22–5.81)
RDW: 12.9 % (ref 11.5–15.5)
WBC: 7.9 10*3/uL (ref 4.0–10.5)

## 2019-03-07 LAB — TSH: TSH: 1.81 u[IU]/mL (ref 0.35–4.50)

## 2019-03-07 NOTE — Assessment & Plan Note (Signed)
For now he will continue nonpharmacologic treatment. We will arrange labs for today and further recommendation will be given according to lipid panel results.

## 2019-03-07 NOTE — Progress Notes (Signed)
Virtual Visit via Video Note   I connected with [name] on 03/07/19 at  8:30 AM EDT by a video enabled telemedicine application and verified that I am speaking with the correct person using two identifiers.  Location patient: home Location provider:home office Persons participating in the virtual visit: patient, provider  I discussed the limitations of evaluation and management by telemedicine and the availability of in person appointments. He expressed understanding and agreed to proceed.   HPI: [name] is a 68 yo with Hx of HLD and elevated transaminases, who is concerned about possible "ministroke."  About 4 days ago, Thursday night, he started with sudden onset of fatigue and felt off of balance. Also noted tingling sensation of right upper extremity and hand,difficulty with fine motor activities like writing.  States that he was writing worse with his right hand than with his left one, he is right-handed.  For a couple of days he needed to " grab something" while walking to prevent falls.Unbalance sensation was aggrayvated by making turns when walking.  Most symptoms have resolved, still feels a very slight tingling sensation on right thumb and index finger. Negative for abdominal pain, nausea, vomiting, or urinary symptoms.  Denies severe/frequent headache, visual changes, chest pain, dyspnea, palpitation, claudication, or edema.  Negative for history of hypertension,CVD, or DM 2. + Former smoker.  Lab Results  Component Value Date   CHOL 210 (H) 07/22/2017   HDL 64.00 07/22/2017   LDLCALC 116 (H) 07/22/2017   TRIG 149.0 07/22/2017   CHOLHDL 3 07/22/2017    ROS: See pertinent positives and negatives per HPI. COVID-19 screening questions: Denies new fever,cough,sore throat,or possible exposure to COVID-19. Negative for loss in the sense of smell or taste.   Past Medical History:  Diagnosis Date  . Arthritis   . Gout     Past Surgical History:  Procedure  Laterality Date  . KIDNEY DONATION    . KNEE ARTHROSCOPY      Family History  Problem Relation Age of Onset  . Arthritis Mother   . Arthritis Father   . Pneumonia Father        Died age 68  . Stroke Brother     Social History   Socioeconomic History  . Marital status: Married    Spouse name: Not on file  . Number of children: Not on file  . Years of education: Not on file  . Highest education level: Not on file  Occupational History  . Occupation: Pharmacist, [employer]  Social Needs  . Financial resource strain: Not on file  . Food insecurity:    Worry: Not on file    Inability: Not on file  . Transportation needs:    Medical: Not on file    Non-medical: Not on file  Tobacco Use  . Smoking status: Former Smoker    Last attempt to quit: 11/03/1998    Years since quitting: 20.3  . Smokeless tobacco: Never Used  Substance and Sexual Activity  . Alcohol use: Not on file  . Drug use: Not on file  . Sexual activity: Not on file  Lifestyle  . Physical activity:    Days per week: Not on file    Minutes per session: Not on file  . Stress: Not on file  Relationships  . Social connections:    Talks on phone: Not on file    Gets together: Not on file    Attends religious service: Not on file    Active member of  club or organization: Not on file    Attends meetings of clubs or organizations: Not on file    Relationship status: Not on file  . Intimate partner violence:    Fear of current or ex partner: Not on file    Emotionally abused: Not on file    Physically abused: Not on file    Forced sexual activity: Not on file  Other Topics Concern  . Not on file  Social History Narrative   Moved here to be with her daughter who works at [employer]     Current Outpatient Medications:  .  Acetaminophen (TYLENOL PO), Take 1,000 mg by mouth daily., Disp: , Rfl:  .  albuterol (PROVENTIL HFA;VENTOLIN HFA) 108 (90 Base) MCG/ACT inhaler, Inhale 2 puffs into the lungs every 4 (four) hours as needed for  wheezing or shortness of breath., Disp: 1 Inhaler, Rfl: 3 .  allopurinol (ZYLOPRIM) 100 MG tablet, Take 1 tablet (100 mg total) by mouth daily., Disp: 90 tablet, Rfl: 3 .  cetirizine (ZYRTEC) 10 MG tablet, Take 10 mg by mouth daily., Disp: , Rfl:  .  omeprazole (PRILOSEC) 20 MG capsule, TAKE ONE CAPSULE BY MOUTH DAILY, Disp: 90 capsule, Rfl: 1  EXAM:  VITALS per patient if applicable:Resp 12   GENERAL: alert, oriented, appears well and in no acute distress  HEENT: atraumatic, conjunctiva clear, normocephalic, and no obvious abnormalities on inspection.    NECK: normal movements of the head and neck  LUNGS: on inspection no signs of respiratory distress, breathing rate appears normal, no obvious gross SOB, gasping or wheezing  CV: no obvious cyanosis  MS: moves all visible extremities without noticeable abnormality  PSYCH/NEURO: pleasant and cooperative, no obvious depression or anxiety, speech and thought processing grossly intact. No focal deficit appreciated,oriented x 3.   ASSESSMENT AND PLAN:  Discussed the following assessment and plan:  Orders Placed This Encounter  Procedures  . MR Brain W Wo Contrast  . MR Brain Wo Contrast  . CBC  . Comprehensive metabolic panel  . Vitamin B12  . Lipid panel  . TSH    Cerebrovascular accident (CVA), unspecified mechanism (HCC)  ? TIA vs stroke. Neurologic exam through video negative for focal deficit.  For now I will hold on recommendations in regard to Aspirin and statin medication until brain MRI and labs are back. We discussed benefits of statins in regard to CVD prevention. He was clearly instructed about warning signs.  Numbness and tingling of right arm Greatly improved. Possible causes discussed. Instructed about warning signs. Further recommendation will be given according to lab results.    Hyperlipidemia For now he will continue nonpharmacologic treatment. We will arrange labs for today and further  recommendation will be given according to lipid panel results.   I discussed the assessment and treatment plan with the patient.  He was provided an opportunity to ask questions and all were answered. The patient agreed with the plan and demonstrated an understanding of the instructions.   The patient was advised to call back or seek an in-person evaluation if the symptoms worsen or if the condition fails to improve as anticipated.   Return if symptoms worsen or fail to improve, for Depending on brain imaging and lab results.    Betty Sw[place]and, MD

## 2019-03-08 LAB — VITAMIN B12: Vitamin B-12: 149 pg/mL — ABNORMAL LOW (ref 211–911)

## 2019-03-15 ENCOUNTER — Other Ambulatory Visit: Payer: Self-pay

## 2019-03-15 ENCOUNTER — Other Ambulatory Visit: Payer: Medicare HMO

## 2019-03-15 ENCOUNTER — Ambulatory Visit
Admission: RE | Admit: 2019-03-15 | Discharge: 2019-03-15 | Disposition: A | Discharge status: Home / Self Care | Payer: Medicare HMO | Source: Ambulatory Visit | Attending: Family Medicine | Admitting: Family Medicine

## 2019-03-15 DIAGNOSIS — I639 Cerebral infarction, unspecified: Secondary | ICD-10-CM

## 2019-03-15 DIAGNOSIS — R200 Anesthesia of skin: Secondary | ICD-10-CM | POA: Diagnosis not present

## 2019-03-18 ENCOUNTER — Telehealth: Payer: Self-pay | Admitting: *Deleted

## 2019-03-18 NOTE — Telephone Encounter (Signed)
Copied from CRM [id number]. Topic: Quick Communication - Other Results (Clinic Use ONLY) >> Mar 18, 2019  7:52 AM Leafy Ro wrote: Pt is calling and would like mri of brain results and also for dr Swaziland to call him back

## 2019-03-18 NOTE — Telephone Encounter (Signed)
Message sent to Dr. Jordan for review. 

## 2019-03-21 ENCOUNTER — Other Ambulatory Visit: Payer: Self-pay | Admitting: Family Medicine

## 2019-03-21 DIAGNOSIS — I639 Cerebral infarction, unspecified: Secondary | ICD-10-CM

## 2019-03-21 MED ORDER — CLOPIDOGREL BISULFATE 75 MG PO TABS: 75.0000 mg | ORAL_TABLET | Freq: Every day | ORAL | 0 refills | Status: DC

## 2019-03-21 MED ORDER — ATORVASTATIN CALCIUM 10 MG PO TABS: 10.0000 mg | ORAL_TABLET | Freq: Every day | ORAL | 3 refills | Status: DC

## 2019-03-21 NOTE — Telephone Encounter (Signed)
Pt would like a call back as soon as possible. He is not happy he has not gotten a call back and would like you to call asap.

## 2019-03-21 NOTE — Telephone Encounter (Signed)
Called [name] and discussed brain MRI results. Aspirin 81 mg,Plavix 75 mg daily,and Atorvastatin 10 mg daily recommended. Plavix 75 mg daily x 3 months. Side effects of medications discussed,including risk of bleeding.  He voices understanding and agrees with plan.  Joseph Burandt [place], MD

## 2019-03-31 ENCOUNTER — Ambulatory Visit: Payer: Self-pay | Admitting: *Deleted

## 2019-03-31 NOTE — Telephone Encounter (Addendum)
Patient calling with complaints of headache, vomiting and fatigue for the past 3-4 days that seem to get worse each day. Pt states that his mouth gets "phlegmy" and makes him have to throw up. Pt states he vomited twice today and it was mainly phlegm. Pt states that he started Atorvastatin on May 17th and states he started to develop the symptoms around that time. Pt also states he experienced diarrhea today that was very loose. Pt states he has been taking Atorvastatin at 5 am every day, including this morning. Pt states he has been checking his temperature and has not experienced a fever. Pt states he has not been around any one that has been sick and thinks that his symptoms are a side effect of Atorvastatin. Pt given home care advice and advised to seek treatment in the ED for worsening symptoms. Email address verified in chart. Pt can be contacted at [phone] with PCP's recommendations.  Reason for Disposition . [1] MILD or MODERATE vomiting AND [2] present > 48 hours (2 days) (Exception: mild vomiting with associated diarrhea)  Answer Assessment - Initial Assessment Questions 1. VOMITING SEVERITY: "How many times have you vomited in the past 24 hours?"     - MILD:  1 - 2 times/day    - MODERATE: 3 - 5 times/day, decreased oral intake without significant weight loss or symptoms of dehydration    - SEVERE: 6 or more times/day, vomits everything or nearly everything, with significant weight loss, symptoms of dehydration      2 times today 2. ONSET: "When did the vomiting begin?"      Started after  3. FLUIDS: "What fluids or food have you vomited up today?" "Have you been able to keep any fluids down?"     Has been able to tolerate water but seems to throw it back up 4. ABDOMINAL PAIN: "Are your having any abdominal pain?" If yes : "How bad is it and what does it feel like?" (e.g., crampy, dull, intermittent, constant)      No stomach pain 5. DIARRHEA: "Is there any diarrhea?" If so, ask:  "How many times today?"      Last time a half an hour ago and was very loose 6. CONTACTS: "Is there anyone else in the family with the same symptoms?"      No 7. CAUSE: "What do you think is causing your vomiting?"     Thinks it may be related to atorvastatin 8. HYDRATION STATUS: "Any signs of dehydration?" (e.g., dry mouth [not only dry lips], too weak to stand) "When did you last urinate?"     Pt states he has been able to drink some water 9. OTHER SYMPTOMS: "Do you have any other symptoms?" (e.g., fever, headache, vertigo, vomiting blood or coffee grounds, recent head injury)     Headache, fatigue, mouth feels "phlegmy"  10. PREGNANCY: "Is there any chance you are pregnant?" "When was your last menstrual period?"       n/a  Protocols used: [facility]

## 2019-04-01 NOTE — Telephone Encounter (Signed)
He can hold temporarily on atorvastatin. Adequate hydration, small and frequent sips of clear fluids.  Continue monitoring temperature. If headache becomes worse,fever develops,dyspnea,chest pain,vomiting gets worse, MS changes or neurologic deficit, he needs to seek immediate medical attention (ER).  F/U Monday 04/04/19.  Thanks, BJ

## 2019-04-01 NOTE — Telephone Encounter (Signed)
Message sent to Dr. Jordan for review. Please advise 

## 2019-04-01 NOTE — Telephone Encounter (Signed)
Patient given recommendations per Dr. Swaziland. Patient verbalized understanding. Patient scheduled for 04/04/2019 at 8:30 am.

## 2019-04-04 ENCOUNTER — Ambulatory Visit (INDEPENDENT_AMBULATORY_CARE_PROVIDER_SITE_OTHER): Payer: Medicare HMO | Admitting: Family Medicine

## 2019-04-04 ENCOUNTER — Other Ambulatory Visit: Payer: Self-pay

## 2019-04-04 ENCOUNTER — Encounter: Payer: Self-pay | Admitting: Family Medicine

## 2019-04-04 VITALS — BP 124/70 | HR 100 | Temp 98.2°F | Resp 12 | Ht 69.0 in | Wt 196.5 lb

## 2019-04-04 DIAGNOSIS — E871 Hypo-osmolality and hyponatremia: Secondary | ICD-10-CM | POA: Diagnosis not present

## 2019-04-04 DIAGNOSIS — I6381 Other cerebral infarction due to occlusion or stenosis of small artery: Secondary | ICD-10-CM | POA: Diagnosis not present

## 2019-04-04 DIAGNOSIS — L309 Dermatitis, unspecified: Secondary | ICD-10-CM | POA: Diagnosis not present

## 2019-04-04 DIAGNOSIS — K219 Gastro-esophageal reflux disease without esophagitis: Secondary | ICD-10-CM | POA: Diagnosis not present

## 2019-04-04 DIAGNOSIS — E785 Hyperlipidemia, unspecified: Secondary | ICD-10-CM | POA: Diagnosis not present

## 2019-04-04 LAB — BASIC METABOLIC PANEL
BUN: 6 mg/dL (ref 6–23)
CO2: 26 mEq/L (ref 19–32)
Calcium: 8.9 mg/dL (ref 8.4–10.5)
Chloride: 96 mEq/L (ref 96–112)
Creatinine, Ser: 0.86 mg/dL (ref 0.40–1.50)
GFR: 88.47 mL/min (ref >60.00)
Glucose, Bld: 123 mg/dL — ABNORMAL HIGH (ref 70–99)
Potassium: 4.4 mEq/L (ref 3.5–5.1)
Sodium: 134 mEq/L — ABNORMAL LOW (ref 135–145)

## 2019-04-04 MED ORDER — ROSUVASTATIN CALCIUM 10 MG PO TABS: 10.0000 mg | ORAL_TABLET | Freq: Every day | ORAL | 1 refills | Status: DC

## 2019-04-04 MED ORDER — DESONIDE 0.05 % EX OINT: 1.0000 "application " | TOPICAL_OINTMENT | Freq: Two times a day (BID) | CUTANEOUS | 0 refills | Status: AC

## 2019-04-04 NOTE — Patient Instructions (Addendum)
A few things to remember from today's visit:   Hyperlipidemia, unspecified hyperlipidemia type - Plan: rosuvastatin (CRESTOR) 10 MG tablet  Left sided lacunar infarction (HCC) - Plan: rosuvastatin (CRESTOR) 10 MG tablet, Ambulatory referral to Physical Therapy  Hyponatremia - Plan: Basic metabolic panel  Burn erythema of lip, initial encounter  Carmex lip balm. Avoid liking lips. Crestor started today.  Please be sure medication list is accurate. If a new problem present, please set up appointment sooner than planned today.

## 2019-04-04 NOTE — Assessment & Plan Note (Signed)
Wee discussed benefits of statin medications. Recommend Crestor 10 mg daily. We discussed some side effects. Continue low-fat diet.

## 2019-04-04 NOTE — Assessment & Plan Note (Signed)
Problem is well controlled as far as he takes Prilosec 20 mg daily. GERD precautions to continue. Some side effects of PPIs discussed.

## 2019-04-04 NOTE — Progress Notes (Signed)
HPI:   JosephJoseph Orr is a 68 y.o. male, who is here today for chronic disease management.  Last F/U visit 03/07/19.  S/P CVA around 03/03/19 (symptoms), brain MRI done on 03/15/19 showed  1. Subacute appearing lacunar infarct of the left thalamus/internal capsule. No associated hemorrhage or mass effect. 2. Otherwise largely normal for age noncontrast MRI appearance of the brain. 3. Moderate left maxillary sinus inflammation.  Most symptoms resolved within 24-36 hours. Still having some difficulty with fine motor skills.His handwritten is not back to his baseline.  He is on Plavix 75 mg daily and Aspirin 81 mg daily.  Hyperlipidemia: Currently on nonpharmacologic treatment. Following a low fat diet: Yes. He does continue atorvastatin because it caused abdominal pain, diarrhea, nausea, vomiting, and headache.  All these symptoms resolved after discontinuation of medication.   Lab Results  Component Value Date   CHOL 149 03/07/2019   HDL 63.30 03/07/2019   LDLCALC 70 03/07/2019   TRIG 80.0 03/07/2019   CHOLHDL 2 03/07/2019   HypoNa++ He decreased fluid intake as recommended. He has not noted abdominal pain,nasuea,vomiting,or MS changes.  Lab Results  Component Value Date   CREATININE 1.01 03/07/2019   BUN 7 03/07/2019   NA 132 (L) 03/07/2019   K 4.6 03/07/2019   CL 94 (L) 03/07/2019   CO2 27 03/07/2019    2 months of lips erythema and crusty areas, pruritus and soreness. Soreness is exacerbated by smiling with stretching mouth. She does not have any oral lesion, sore throat, cough, wheezing, or dyspnea. He has not tried OTC medication. He leaks he sleeps frequently.  No new medication, new facial products,, prolonged sun exposure, or insect bites. Problem is stable.   Review of Systems  Constitutional: Negative for chills and fever.  Eyes: Negative for discharge and redness.  Genitourinary: Negative for decreased urine volume and hematuria.   Musculoskeletal: Negative for arthralgias and myalgias.  Allergic/Immunologic: Negative for environmental allergies.  Neurological: Negative for tremors and seizures.  Hematological: Negative for adenopathy. Does not bruise/bleed easily.  Rest of ROS, pertinent positives and negatives in HPI   Current Outpatient Medications on File Prior to Visit  Medication Sig Dispense Refill  . Acetaminophen (TYLENOL PO) Take 1,000 mg by mouth daily.    Marland Kitchen. albuterol (PROVENTIL HFA;VENTOLIN HFA) 108 (90 Base) MCG/ACT inhaler Inhale 2 puffs into the lungs every 4 (four) hours as needed for wheezing or shortness of breath. 1 Inhaler 3  . allopurinol (ZYLOPRIM) 100 MG tablet Take 1 tablet (100 mg total) by mouth daily. 90 tablet 3  . cetirizine (ZYRTEC) 10 MG tablet Take 10 mg by mouth daily.    . clopidogrel (PLAVIX) 75 MG tablet Take 1 tablet (75 mg total) by mouth daily. 90 tablet 0  . omeprazole (PRILOSEC) 20 MG capsule TAKE ONE CAPSULE BY MOUTH DAILY 90 capsule 1  . atorvastatin (LIPITOR) 10 MG tablet Take 1 tablet (10 mg total) by mouth daily. (Patient not taking: Reported on 04/04/2019) 90 tablet 3   No current facility-administered medications on file prior to visit.      Past Medical History:  Diagnosis Date  . Arthritis   . Gout    Allergies  Allergen Reactions  . Oxycodone-Acetaminophen Nausea And Vomiting    vomiting    Social History   Socioeconomic History  . Marital status: Married    Spouse name: Not on file  . Number of children: Not on file  . Years of education: Not  on file  . Highest education level: Not on file  Occupational History  . Occupation: Pharmacist, community  Social Needs  . Financial resource strain: Not on file  . Food insecurity:    Worry: Not on file    Inability: Not on file  . Transportation needs:    Medical: Not on file    Non-medical: Not on file  Tobacco Use  . Smoking status: Former Smoker    Last attempt to quit: 11/03/1998    Years since quitting: 20.4  .  Smokeless tobacco: Never Used  Substance and Sexual Activity  . Alcohol use: Not on file  . Drug use: Not on file  . Sexual activity: Not on file  Lifestyle  . Physical activity:    Days per week: Not on file    Minutes per session: Not on file  . Stress: Not on file  Relationships  . Social connections:    Talks on phone: Not on file    Gets together: Not on file    Attends religious service: Not on file    Active member of club or organization: Not on file    Attends meetings of clubs or organizations: Not on file    Relationship status: Not on file  Other Topics Concern  . Not on file  Social History Narrative   Moved here to be with her daughter who works at [employer]:   04/04/19 0847  BP: 124/70  Pulse: 100  Resp: 12  Temp: 98.2 F (36.8 C)  SpO2: 97%   Body mass index is 29.02 kg/m.   Physical Exam  Nursing note and vitals reviewed. Constitutional: He is oriented to person, place, and time. He appears well-developed. No distress.  HENT:  Head: Normocephalic and atraumatic.  Mouth/Throat: Oropharynx is clear and moist and mucous membranes are normal.  Eyes: Pupils are equal, round, and reactive to light. Conjunctivae are normal.  Cardiovascular: Normal rate and regular rhythm.  No murmur heard. Pulses:      Dorsalis pedis pulses are 2+ on the right side and 2+ on the left side.  Respiratory: Effort normal and breath sounds normal. No respiratory distress.  GI: Soft. He exhibits no mass. There is no hepatomegaly. There is no abdominal tenderness.  Musculoskeletal:        General: No edema.  Lymphadenopathy:    He has no cervical adenopathy.  Neurological: He is alert and oriented to person, place, and time. He has normal strength. No cranial nerve deficit.  Mildly unstable, not assisted.  Skin: Skin is warm. No rash noted. There is erythema.  Erythema,crusty areas on upper and lower lips.No ulcers or edema.  Psychiatric: He has a normal mood and  affect.  Well groomed, good eye contact.     ASSESSMENT AND PLAN:   Joseph Orr was seen today for chronic disease management.  Orders Placed This Encounter  Procedures  . Basic metabolic panel  . Ambulatory referral to Physical Therapy   Lab Results  Component Value Date   CREATININE 0.86 04/04/2019   BUN 6 04/04/2019   NA 134 (L) 04/04/2019   K 4.4 04/04/2019   CL 96 04/04/2019   CO2 26 04/04/2019     Hyponatremia Continue fluid restriction. Instructed about warning signs. Further recommendation will be given according to lab results.  - Basic metabolic panel  Lip licking dermatitis Avoid licking lips. OTC lip balsam to use as needed. Topical steroid recommended,small amount bid for up  to 14 days.    - desonide (DESOWEN) 0.05 % ointment; Apply 1 application topically 2 (two) times daily for 14 days. For up to 2 weeks.  Dispense: 15 g; Refill: 0  Hyperlipidemia Wee discussed benefits of statin medications. Recommend Crestor 10 mg daily. We discussed some side effects. Continue low-fat diet.  GERD (gastroesophageal reflux disease) Problem is well controlled as far as he takes Prilosec 20 mg daily. GERD precautions to continue. Some side effects of PPIs discussed.  Left sided lacunar infarction (HCC) Continue Plavix 75 mg daily and Aspirin 81 mg daily for 3 months, then he will continue with Plavix 75 mg daily. We discussed side effects of medications.    Return in about 6 months (around 10/04/2019) for F/U.   -Mr. Brayln Donney Dice was advised to return sooner than planned today if new concerns arise.       Lesley Galentine G. [place], MD  [facility]. [place] office.

## 2019-04-04 NOTE — Assessment & Plan Note (Signed)
Continue Plavix 75 mg daily and Aspirin 81 mg daily for 3 months, then he will continue with Plavix 75 mg daily. We discussed side effects of medications.

## 2019-04-16 ENCOUNTER — Other Ambulatory Visit: Payer: Self-pay | Admitting: Family Medicine

## 2019-04-16 DIAGNOSIS — M109 Gout, unspecified: Secondary | ICD-10-CM

## 2019-04-25 ENCOUNTER — Other Ambulatory Visit: Payer: Self-pay | Admitting: Family Medicine

## 2019-04-25 DIAGNOSIS — K219 Gastro-esophageal reflux disease without esophagitis: Secondary | ICD-10-CM

## 2019-04-26 ENCOUNTER — Telehealth: Payer: Self-pay | Admitting: *Deleted

## 2019-04-26 NOTE — Telephone Encounter (Signed)
Spoke with patient and he stated that he spoke with the pharmacy and they informed him that the police station would dispose of the unused medications if he took it to one of the headquarters. Patient stated that he stopped all automatic Rx's for the medications from his pharmacy. Nothing further needed at this time.  Copied from [place] [id number]. Topic: General - Other >> Apr 25, 2019 12:18 PM Leward Quan A wrote: Reason for CRM: Patient called to inform Dr Martinique that he is no longer taking the Statin drugs that was prescribed for him. atorvastatin (LIPITOR) 10 MG tablet and rosuvastatin (CRESTOR) 10 MG tablet due to them making him sick on the stomach terribly. Patient would like a call back also would like to know how to dispose of these medications. Ph#  [phone]

## 2019-04-29 ENCOUNTER — Other Ambulatory Visit: Payer: Self-pay | Admitting: Family Medicine

## 2019-04-29 DIAGNOSIS — K219 Gastro-esophageal reflux disease without esophagitis: Secondary | ICD-10-CM

## 2019-05-24 ENCOUNTER — Telehealth: Payer: Self-pay

## 2019-05-24 NOTE — Telephone Encounter (Signed)
Patient given recommendations per Dr. Jordan and verbalized understanding. 

## 2019-05-24 NOTE — Telephone Encounter (Signed)
Spoke with patient and he stated that about every week since starting medication that he has noticed bruising on his arms. Patient stated that he has brought a new chair recently and could be hitting his arms on the metal piece of the chair. Patient stated that he would keep an eye out for new bruising and would contact office if it get any worse.

## 2019-05-24 NOTE — Telephone Encounter (Signed)
Plavix was recommended for stroke prevention. He is supposed to be on Plavix and aspirin until 06/2019 then he is supposed to continue just Plavix.  Plavix and aspirin increase risk of bleeding and bruising with minor trauma. If bruising is getting worse or he has other source of bleeding(nose,gum,urine,minimal in stool) he can discontinue Plavix and continue Aspirin.  Thanks, BJ

## 2019-05-24 NOTE — Telephone Encounter (Signed)
Copied from [place] [id number]. Topic: General - Other >> May 24, 2019  8:19 AM Leward Quan A wrote: Reason for CRM: Patient called to report that he have noticed bruising on his arm by the elbow say that he have noticed several since he started taking the clopidogrel (PLAVIX) 75 MG tablet say they are not very severe. But was just following instructions and reporting it to his PCP Ph# [phone]

## 2019-06-15 ENCOUNTER — Other Ambulatory Visit: Payer: Self-pay | Admitting: Family Medicine

## 2019-06-15 ENCOUNTER — Telehealth: Payer: Self-pay | Admitting: Family Medicine

## 2019-06-15 DIAGNOSIS — I639 Cerebral infarction, unspecified: Secondary | ICD-10-CM

## 2019-06-15 NOTE — Telephone Encounter (Signed)
Message sent to Dr. Jordan for review. Please advise 

## 2019-06-15 NOTE — Telephone Encounter (Signed)
Copied from [place] [phone]. Topic: General - Other >> Jun 15, 2019  8:08 AM Keene Breath wrote: Reason for CRM: Patient called to get clarification on his medication, clopidogrel (PLAVIX) 75 MG tablet.  Should he discontinue, he is not sure.  CB# [phone]

## 2019-06-17 NOTE — Telephone Encounter (Signed)
Patient calling regarding this medication. After reading about medication, he states there is a disclaimer to "speak with physician if you are taking this medication along with certain other medications including omeprazole (PRILOSEC)". Patient inquired if this may lessen the efficiency of this medication. Patient is requesting a call back to discuss.

## 2019-06-17 NOTE — Telephone Encounter (Signed)
There was an initial concern about combination of Plavix and PPIs, especially omeprazole about these meds decreasing Plavix activity. This is not completely clear at this time, so there is not an absolute contraindication of using both agents. Patient who have high risk of GI bleeding should be on a PPI for bleeding prevention. He can change omeprazole to Protonix 20 mg daily or aciphex 20 mg daily.  Thanks, BJ

## 2019-06-21 ENCOUNTER — Other Ambulatory Visit: Payer: Self-pay | Admitting: *Deleted

## 2019-06-21 MED ORDER — PANTOPRAZOLE SODIUM 20 MG PO TBEC: 20.0000 mg | DELAYED_RELEASE_TABLET | Freq: Every day | ORAL | 2 refills | Status: DC

## 2019-06-21 NOTE — Telephone Encounter (Signed)
Spoke with patient and gave recommendations per Dr. Martinique. Patient stated that he would rather switch is medication. Protonix 20 mg daily sent to the pharmacy as requested by patient.

## 2019-06-28 DIAGNOSIS — H5703 Miosis: Secondary | ICD-10-CM | POA: Diagnosis not present

## 2019-06-28 DIAGNOSIS — H2511 Age-related nuclear cataract, right eye: Secondary | ICD-10-CM | POA: Diagnosis not present

## 2019-07-04 ENCOUNTER — Other Ambulatory Visit: Payer: Self-pay | Admitting: Family Medicine

## 2019-07-04 DIAGNOSIS — I639 Cerebral infarction, unspecified: Secondary | ICD-10-CM

## 2019-07-06 DIAGNOSIS — R69 Illness, unspecified: Secondary | ICD-10-CM | POA: Diagnosis not present

## 2019-07-17 DIAGNOSIS — H2511 Age-related nuclear cataract, right eye: Secondary | ICD-10-CM | POA: Diagnosis not present

## 2019-07-21 DIAGNOSIS — H5703 Miosis: Secondary | ICD-10-CM | POA: Diagnosis not present

## 2019-07-21 DIAGNOSIS — H2511 Age-related nuclear cataract, right eye: Secondary | ICD-10-CM | POA: Diagnosis not present

## 2019-08-15 ENCOUNTER — Emergency Department (HOSPITAL_COMMUNITY)
Admission: EM | Admit: 2019-08-15 | Discharge: 2019-08-15 | Disposition: A | Discharge status: Home / Self Care | Payer: Medicare HMO | Source: Home / Self Care | Attending: Emergency Medicine | Admitting: Emergency Medicine

## 2019-08-15 ENCOUNTER — Other Ambulatory Visit: Payer: Self-pay

## 2019-08-15 ENCOUNTER — Emergency Department (HOSPITAL_COMMUNITY): Payer: Medicare HMO

## 2019-08-15 ENCOUNTER — Encounter (HOSPITAL_COMMUNITY): Payer: Self-pay | Admitting: Emergency Medicine

## 2019-08-15 DIAGNOSIS — K551 Chronic vascular disorders of intestine: Secondary | ICD-10-CM | POA: Diagnosis not present

## 2019-08-15 DIAGNOSIS — Z87891 Personal history of nicotine dependence: Secondary | ICD-10-CM | POA: Insufficient documentation

## 2019-08-15 DIAGNOSIS — I959 Hypotension, unspecified: Secondary | ICD-10-CM | POA: Diagnosis not present

## 2019-08-15 DIAGNOSIS — R112 Nausea with vomiting, unspecified: Secondary | ICD-10-CM | POA: Insufficient documentation

## 2019-08-15 DIAGNOSIS — R61 Generalized hyperhidrosis: Secondary | ICD-10-CM | POA: Insufficient documentation

## 2019-08-15 DIAGNOSIS — J449 Chronic obstructive pulmonary disease, unspecified: Secondary | ICD-10-CM | POA: Insufficient documentation

## 2019-08-15 DIAGNOSIS — R0902 Hypoxemia: Secondary | ICD-10-CM | POA: Diagnosis not present

## 2019-08-15 DIAGNOSIS — R079 Chest pain, unspecified: Secondary | ICD-10-CM

## 2019-08-15 DIAGNOSIS — R0689 Other abnormalities of breathing: Secondary | ICD-10-CM | POA: Diagnosis not present

## 2019-08-15 DIAGNOSIS — R0602 Shortness of breath: Secondary | ICD-10-CM | POA: Diagnosis not present

## 2019-08-15 DIAGNOSIS — Z79899 Other long term (current) drug therapy: Secondary | ICD-10-CM | POA: Insufficient documentation

## 2019-08-15 DIAGNOSIS — R1010 Upper abdominal pain, unspecified: Secondary | ICD-10-CM | POA: Insufficient documentation

## 2019-08-15 DIAGNOSIS — Z20828 Contact with and (suspected) exposure to other viral communicable diseases: Secondary | ICD-10-CM | POA: Insufficient documentation

## 2019-08-15 DIAGNOSIS — R1110 Vomiting, unspecified: Secondary | ICD-10-CM

## 2019-08-15 LAB — CBC
HCT: 40.8 % (ref 39.0–52.0)
Hemoglobin: 13.9 g/dL (ref 13.0–17.0)
MCH: 33.7 pg (ref 26.0–34.0)
MCHC: 34.1 g/dL (ref 30.0–36.0)
MCV: 99 fL (ref 80.0–100.0)
Platelets: 151 10*3/uL (ref 150–400)
RBC: 4.12 MIL/uL — ABNORMAL LOW (ref 4.22–5.81)
RDW: 11.9 % (ref 11.5–15.5)
WBC: 8.9 10*3/uL (ref 4.0–10.5)
nRBC: 0 % (ref 0.0–0.2)

## 2019-08-15 LAB — HEPATIC FUNCTION PANEL
ALT: 35 U/L (ref 0–44)
AST: 75 U/L — ABNORMAL HIGH (ref 15–41)
Albumin: 3.8 g/dL (ref 3.5–5.0)
Alkaline Phosphatase: 90 U/L (ref 38–126)
Bilirubin, Direct: 0.8 mg/dL — ABNORMAL HIGH (ref 0.0–0.2)
Indirect Bilirubin: 1.1 mg/dL — ABNORMAL HIGH (ref 0.3–0.9)
Total Bilirubin: 1.9 mg/dL — ABNORMAL HIGH (ref 0.3–1.2)
Total Protein: 6.6 g/dL (ref 6.5–8.1)

## 2019-08-15 LAB — SARS CORONAVIRUS 2 (TAT 6-24 HRS): SARS Coronavirus 2: NEGATIVE

## 2019-08-15 LAB — BASIC METABOLIC PANEL
Anion gap: 12 (ref 5–15)
BUN: 9 mg/dL (ref 8–23)
CO2: 24 mmol/L (ref 22–32)
Calcium: 8.8 mg/dL — ABNORMAL LOW (ref 8.9–10.3)
Chloride: 94 mmol/L — ABNORMAL LOW (ref 98–111)
Creatinine, Ser: 1.03 mg/dL (ref 0.61–1.24)
GFR calc Af Amer: >60 mL/min (ref >60)
GFR calc non Af Amer: >60 mL/min (ref >60)
Glucose, Bld: 137 mg/dL — ABNORMAL HIGH (ref 70–99)
Potassium: 4.4 mmol/L (ref 3.5–5.1)
Sodium: 130 mmol/L — ABNORMAL LOW (ref 135–145)

## 2019-08-15 LAB — TROPONIN I (HIGH SENSITIVITY)
Troponin I (High Sensitivity): 3 ng/L (ref <18)
Troponin I (High Sensitivity): 2 ng/L (ref <18)

## 2019-08-15 LAB — LIPASE, BLOOD: Lipase: 32 U/L (ref 11–51)

## 2019-08-15 MED ORDER — METOCLOPRAMIDE HCL 5 MG/ML IJ SOLN
10.0000 mg | Freq: Once | INTRAMUSCULAR | Status: AC
  Administered 2019-08-15: 10 mg via INTRAVENOUS
  Filled 2019-08-15: qty 2

## 2019-08-15 MED ORDER — ONDANSETRON HCL 4 MG PO TABS: 4.0000 mg | ORAL_TABLET | Freq: Four times a day (QID) | ORAL | 0 refills | Status: DC

## 2019-08-15 MED ORDER — IOHEXOL 350 MG/ML SOLN
100.0000 mL | Freq: Once | INTRAVENOUS | Status: AC | PRN
  Administered 2019-08-15: 100 mL via INTRAVENOUS

## 2019-08-15 MED ORDER — SODIUM CHLORIDE 0.9 % IV BOLUS
500.0000 mL | Freq: Once | INTRAVENOUS | Status: AC
  Administered 2019-08-15: 500 mL via INTRAVENOUS

## 2019-08-15 MED ORDER — ONDANSETRON HCL 4 MG/2ML IJ SOLN
4.0000 mg | Freq: Once | INTRAMUSCULAR | Status: AC
  Administered 2019-08-15: 4 mg via INTRAVENOUS
  Filled 2019-08-15: qty 2

## 2019-08-15 NOTE — ED Triage Notes (Signed)
Per EMS pt coming from home c/o central chest pain and nausea onset of noon. Patient given 4mg  zofran, 324 aspirin and 2 nitro and 500CC NS PTA. Patient still dry heaving on arrival.

## 2019-08-15 NOTE — ED Notes (Signed)
Patient states intermittent nausea and currently has nausea doctor notified.

## 2019-08-15 NOTE — ED Notes (Signed)
Called CT confirmed they are able to use the IV patient has.

## 2019-08-15 NOTE — ED Notes (Signed)
Called CT stated patient is next.

## 2019-08-15 NOTE — Discharge Instructions (Addendum)
You were evaluated in the emergency department for chest and abdominal pain along with vomiting.  You had blood work and a CAT scan that did not show any evidence of heart attack.  There was some suspicion that this may be related to your gallbladder.  We are scheduling you for a ultrasound and a referral to gastroenterology.  Please return to the emergency department if any worsening or concerning symptoms.

## 2019-08-15 NOTE — ED Notes (Signed)
Patient returned from CT ambulated to bathroom to have a bowel movement without incident.

## 2019-08-15 NOTE — ED Provider Notes (Signed)
Signout from Dr. Reather Converse.  68 year old male with chest and abdominal pain and nausea, vomiting.  He is pending a CT chest and abdomen dissection protocol and delta troponin.  Possible admission depending on results of testing and if patient improves. Physical Exam  BP (!) 146/77   Pulse 86   Temp (!) 97 F (36.1 C) (Axillary)   Resp 19   SpO2 97%   Physical Exam  ED Course/Procedures     Procedures  MDM  Patient's delta troponin is unremarkable.  His CT showed some haziness near his gallbladder, recommending getting a right upper quadrant.  Ultrasound not currently available at this time.  I reviewed this with the patient and he said he would rather go home and do this as an outpatient.  We will put him on some Zofran as it helped his nausea and see if I can order him an outpatient ultrasound and GI referral.  He understands to return if any worsening symptoms.       Hayden Rasmussen, MD 08/15/19 [phone]

## 2019-08-15 NOTE — ED Provider Notes (Addendum)
[facility] EMERGENCY DEPARTMENT Provider Note   CSN: [id number] Arrival date & time: 08/15/19  1434     History   Chief Complaint Chief Complaint  Patient presents with  . Chest Pain    HPI Raimundo Corbit is a 68 y.o. male.     Patient with history of alcohol use, COPD, lipids, stroke presents with central chest pain and pressure along with nausea and vomiting that started around noon.  Patient has upper abdominal pain and chest pain no specific radiation.  No history of similar.  Patient did not eat any concerning food last night.  No fevers or chills or cough.  Patient denies shortness of breath or leg swelling.     Past Medical History:  Diagnosis Date  . Arthritis   . Gout     Patient Active Problem List   Diagnosis Date Noted  . Left sided lacunar infarction ([place]) 04/04/2019  . COPD (chronic obstructive pulmonary disease) ([place]) 04/06/2018  . GERD (gastroesophageal reflux disease) 04/06/2018  . Hyperlipidemia 07/21/2017  . Gouty arthritis 06/03/2016  . Osteoarthritis of knee 06/03/2016    Past Surgical History:  Procedure Laterality Date  . KIDNEY DONATION    . KNEE ARTHROSCOPY          Home Medications    Prior to Admission medications   Medication Sig Start Date End Date Taking? Authorizing Provider  albuterol (PROVENTIL HFA;VENTOLIN HFA) 108 (90 Base) MCG/ACT inhaler Inhale 2 puffs into the lungs every 4 (four) hours as needed for wheezing or shortness of breath. 04/06/18  Yes Martinique, Betty G, MD  allopurinol (ZYLOPRIM) 100 MG tablet TAKE 1 TABLET BY MOUTH DAILY Patient taking differently: Take 100 mg by mouth daily.  04/19/19  Yes Martinique, Betty G, MD  cetirizine (ZYRTEC) 10 MG tablet Take 10 mg by mouth daily.   Yes [provider]  clopidogrel (PLAVIX) 75 MG tablet TAKE 1 TABLET BY MOUTH DAILY Patient taking differently: Take 75 mg by mouth daily.  06/15/19  Yes Martinique, Betty G, MD  ibuprofen (ADVIL) 200 MG tablet  Take 400 mg by mouth every 6 (six) hours as needed for moderate pain.   Yes [provider]  pantoprazole (PROTONIX) 20 MG tablet Take 1 tablet (20 mg total) by mouth daily. 06/21/19  Yes Martinique, Betty G, MD  prednisoLONE acetate (PRED FORTE) 1 % ophthalmic suspension Place 1 drop into the right eye daily. 06/29/19  Yes [provider]    Family History Family History  Problem Relation Age of Onset  . Arthritis Mother   . Arthritis Father   . Pneumonia Father        Died age 64  . Stroke Brother     Social History Social History   Tobacco Use  . Smoking status: Former Smoker    Quit date: 11/03/1998    Years since quitting: 20.7  . Smokeless tobacco: Never Used  Substance Use Topics  . Alcohol use: Not on file  . Drug use: Not on file     Allergies   Oxycodone-acetaminophen, Atorvastatin, and Rosuvastatin   Review of Systems Review of Systems  Constitutional: Positive for appetite change and diaphoresis. Negative for chills and fever.  HENT: Negative for congestion.   Eyes: Negative for visual disturbance.  Respiratory: Negative for shortness of breath.   Cardiovascular: Positive for chest pain.  Gastrointestinal: Positive for abdominal pain, nausea and vomiting.  Genitourinary: Negative for dysuria and flank pain.  Musculoskeletal: Negative for back pain,  neck pain and neck stiffness.  Skin: Negative for rash.  Neurological: Negative for light-headedness and headaches.     Physical Exam Updated Vital Signs BP 138/64   Pulse 78   Temp (!) 97 F (36.1 C) (Axillary)   Resp 18   SpO2 98%   Physical Exam Vitals signs and nursing note reviewed.  Constitutional:      Appearance: He is well-developed. He is diaphoretic.  HENT:     Head: Normocephalic and atraumatic.     Comments: Dry mm Eyes:     General:        Right eye: No discharge.        Left eye: No discharge.     Conjunctiva/sclera: Conjunctivae normal.  Neck:     Musculoskeletal:  Normal range of motion and neck supple.     Trachea: No tracheal deviation.  Cardiovascular:     Rate and Rhythm: Normal rate and regular rhythm.  Pulmonary:     Effort: Pulmonary effort is normal.     Breath sounds: Normal breath sounds.  Abdominal:     General: There is no distension.     Palpations: Abdomen is soft.     Tenderness: There is abdominal tenderness (upper mid). There is no guarding.  Musculoskeletal:     Right lower leg: No edema.     Left lower leg: No edema.  Skin:    General: Skin is warm.     Findings: No rash.  Neurological:     Mental Status: He is alert and oriented to person, place, and time.      ED Treatments / Results  Labs (all labs ordered are listed, but only abnormal results are displayed) Labs Reviewed  CBC - Abnormal; Notable for the following components:      Result Value   RBC 4.12 (*)    All other components within normal limits  SARS CORONAVIRUS 2 (TAT 6-24 HRS)  BASIC METABOLIC PANEL  HEPATIC FUNCTION PANEL  LIPASE, BLOOD  TROPONIN I (HIGH SENSITIVITY)    EKG EKG Interpretation  Date/Time:  Monday August 15 2019 15:00:40 EDT Ventricular Rate:  76 PR Interval:    QRS Duration: 88 QT Interval:  374 QTC Calculation: 421 R Axis:   74 Text Interpretation:  Sinus rhythm Confirmed by Blane OharaZavitz, Everlena Mackley [phone]([id number]) on 08/15/2019 3:10:52 PM   Radiology Dg Chest 2 View  Result Date: 08/15/2019 CLINICAL DATA:  Chest pain EXAM: CHEST - 2 VIEW COMPARISON:  08/10/2018 FINDINGS: Cardiac silhouette is upper limits of normal in size. Calcific aortic knob. No focal airspace consolidation, pleural effusion, or pneumothorax. IMPRESSION: Borderline cardiomegaly.  No acute findings. Electronically Signed   By: Duanne GuessNicholas  Plundo M.D.   On: 08/15/2019 15:27    Procedures Procedures (including critical care time)  Medications Ordered in ED Medications  metoCLOPramide (REGLAN) injection 10 mg (10 mg Intravenous Given 08/15/19 1454)  sodium chloride  0.9 % bolus 500 mL (500 mLs Intravenous New Bag/Given 08/15/19 1548)     Initial Impression / Assessment and Plan / ED Course  I have reviewed the triage vital signs and the nursing notes.  Pertinent labs & imaging results that were available during my care of the patient were reviewed by me and considered in my medical decision making (see chart for details).       Patient presents with worsening chest discomfort and upper abdominal discomfort along with nausea and vomiting.  Discussed differential diagnosis including gastric related, cardiac, partial bowel obstruction, dissection, other.  Plan  for blood work, IV fluids, patient improved with antiemetics.  Patient's care be signed out to reassess and follow-up CT scan results and second troponin.  Initial EKG no obvious ischemic findings.  Initial blood work pending.    Final Clinical Impressions(s) / ED Diagnoses   Final diagnoses:  Vomiting in adult  Acute chest pain    ED Discharge Orders    None       Blane Ohara, MD 08/15/19 1616    Blane Ohara, MD 08/15/19 [phone]

## 2019-08-17 ENCOUNTER — Inpatient Hospital Stay (HOSPITAL_COMMUNITY)
Admission: RE | Admit: 2019-08-17 | Discharge: 2019-08-17 | Disposition: A | Discharge status: Home / Self Care | Payer: Medicare HMO | Source: Ambulatory Visit | Attending: Emergency Medicine | Admitting: Emergency Medicine

## 2019-08-17 ENCOUNTER — Other Ambulatory Visit: Payer: Self-pay

## 2019-08-17 ENCOUNTER — Encounter: Payer: Self-pay | Admitting: Gastroenterology

## 2019-08-17 ENCOUNTER — Ambulatory Visit: Payer: Medicare HMO | Admitting: Gastroenterology

## 2019-08-17 VITALS — BP 140/82 | HR 86 | Temp 97.0°F | Ht 69.0 in | Wt 192.0 lb

## 2019-08-17 DIAGNOSIS — R932 Abnormal findings on diagnostic imaging of liver and biliary tract: Secondary | ICD-10-CM | POA: Diagnosis not present

## 2019-08-17 DIAGNOSIS — K8020 Calculus of gallbladder without cholecystitis without obstruction: Secondary | ICD-10-CM | POA: Insufficient documentation

## 2019-08-17 DIAGNOSIS — K5909 Other constipation: Secondary | ICD-10-CM

## 2019-08-17 DIAGNOSIS — R1013 Epigastric pain: Secondary | ICD-10-CM | POA: Diagnosis not present

## 2019-08-17 DIAGNOSIS — R112 Nausea with vomiting, unspecified: Secondary | ICD-10-CM

## 2019-08-17 NOTE — Progress Notes (Signed)
Reviewed and agree with management plan.  Chicquita Mendel T. Anyelin Mogle, MD FACG [facility]  

## 2019-08-17 NOTE — Patient Instructions (Signed)
You will receive a call from [facility] to schedule an appointment.  Start taking powder Benefiber or Citrucel daily - 2 tablespoons in 8 ounces of liquid

## 2019-08-17 NOTE — Progress Notes (Signed)
08/17/2019 Joseph Orr [id number] 10-Feb-1951   HISTORY OF PRESENT ILLNESS: This is a 68 year old male who is new to our office.  Was referred here by the emergency department, Dr. Melina Copa, for evaluation regarding epigastric abdominal pain, nausea, vomiting, and belching.  He tells me that he has been having issues with nausea and belching intermittently for a month of so now.  Then 2 days ago he was in the emergency department with complaints of the same but also had epigastric abdominal pain.  CTA of the chest, abdomen, and pelvis was unremarkable.  Lipase and CBC were normal.  LFTs with elevated AST of 75 and total bili of 1.9.  Normal ALT and ALP.  He had an ultrasound performed this morning that showed minimal cholelithiasis and a large amount of sludge in the gallbladder lumen with a borderline dilatation of common bile duct at 8 mm.  He takes pantoprazole 20 mg daily for reflux related symptoms and feels that there is symptoms are very well controlled on his medication.    While he is here he also reports that he has some issues with constipation.  This is been an ongoing issue.  Says that he usually goes every day, but it comes out in small pieces.   Past Medical History:  Diagnosis Date  . Arthritis   . GERD (gastroesophageal reflux disease)   . Gout   . TIA (transient ischemic attack) 02/2019   Past Surgical History:  Procedure Laterality Date  . COLONOSCOPY     +8y  . KIDNEY DONATION    . KNEE ARTHROSCOPY      reports that he quit smoking about 20 years ago. He has never used smokeless tobacco. He reports current alcohol use of about 3.0 - 4.0 standard drinks of alcohol per week. He reports that he does not use drugs. family history includes Arthritis in his father and mother; Pneumonia in his father; Stroke in his brother. Allergies  Allergen Reactions  . Oxycodone-Acetaminophen Nausea And Vomiting    vomiting  . Atorvastatin Nausea And Vomiting  .  Rosuvastatin Nausea And Vomiting      Outpatient Encounter Medications as of 08/17/2019  Medication Sig  . acetaminophen (TYLENOL) 325 MG tablet Take 650 mg by mouth every 6 (six) hours as needed.  Marland Kitchen albuterol (PROVENTIL HFA;VENTOLIN HFA) 108 (90 Base) MCG/ACT inhaler Inhale 2 puffs into the lungs every 4 (four) hours as needed for wheezing or shortness of breath.  . allopurinol (ZYLOPRIM) 100 MG tablet TAKE 1 TABLET BY MOUTH DAILY (Patient taking differently: Take 100 mg by mouth daily. )  . cetirizine (ZYRTEC) 10 MG tablet Take 10 mg by mouth daily.  . clopidogrel (PLAVIX) 75 MG tablet TAKE 1 TABLET BY MOUTH DAILY (Patient taking differently: Take 75 mg by mouth daily. )  . ondansetron (ZOFRAN) 4 MG tablet Take 1 tablet (4 mg total) by mouth every 6 (six) hours.  . pantoprazole (PROTONIX) 20 MG tablet Take 1 tablet (20 mg total) by mouth daily.  . prednisoLONE acetate (PRED FORTE) 1 % ophthalmic suspension Place 1 drop into the right eye daily.  . [DISCONTINUED] ibuprofen (ADVIL) 200 MG tablet Take 400 mg by mouth every 6 (six) hours as needed for moderate pain.   No facility-administered encounter medications on file as of 08/17/2019.      REVIEW OF SYSTEMS  : All other systems reviewed and negative except where noted in the History of Present Illness.   PHYSICAL EXAM: BP 140/82  Pulse 86   Temp (!) 97 F (36.1 C)   Ht 5' 9"  (1.753 m)   Wt 192 lb (87.1 kg)   SpO2 100%   BMI 28.35 kg/m  General: Well developed white male in no acute distress Head: Normocephalic and atraumatic Eyes:  Sclerae anicteric, conjunctiva pink. Ears: Normal auditory acuity Lungs: Clear throughout to auscultation; no increased WOB. Heart: Regular rate and rhythm; no M/R/G. Abdomen: Soft, non-distended.  BS present.  Mild epigastric TTP. Musculoskeletal: Symmetrical with no gross deformities  Skin: No lesions on visible extremities Extremities: No edema  Neurological: Alert oriented x 4, grossly  non-focal Psychological:  Alert and cooperative. Normal mood and affect  ASSESSMENT AND PLAN: *68 year old male with complaints of epigastric abdominal pain, nausea/vomiting, belching that has been happening intermittently for about a month or so.  Was in the emergency department 2 days ago.  Ultrasound this morning showed minimal cholelithiasis and a large amount of sludge in the gallbladder with borderline dilatation of the common bile duct at 8 mm.  LFTs mildly elevated with an AST of 75 and a total bili of 1.9.  Normal ALT and alk phos.  Denies heartburn or reflux symptoms and takes pantoprazole 20 mg daily, which he feels controls reflux issues well.  Will refer to [facility] for possible cholecystectomy as it seems that his symptoms could be gallbadder related and he has abnormal gallbladder imaging this morning. *Constipation:  Seems mild.  Says that he moves his bowels regularly, but it comes out in small pieces.  This has been an ongoing issue for quite some time.  Recommend starting by trying a daily powder fiber such as Benefiber or Citrucel, 2 tablespoons in 8 ounces of liquid daily.   CC:  Hayden Rasmussen, MD

## 2019-08-18 ENCOUNTER — Inpatient Hospital Stay (HOSPITAL_COMMUNITY)
Admission: AD | Admit: 2019-08-18 | Discharge: 2019-08-25 | DRG: 853 | Disposition: A | Discharge status: Home / Self Care | Payer: Medicare HMO | Attending: Internal Medicine | Admitting: Internal Medicine

## 2019-08-18 ENCOUNTER — Ambulatory Visit: Payer: Self-pay | Admitting: General Surgery

## 2019-08-18 ENCOUNTER — Telehealth: Payer: Self-pay

## 2019-08-18 DIAGNOSIS — N170 Acute kidney failure with tubular necrosis: Secondary | ICD-10-CM | POA: Diagnosis not present

## 2019-08-18 DIAGNOSIS — E876 Hypokalemia: Secondary | ICD-10-CM | POA: Diagnosis not present

## 2019-08-18 DIAGNOSIS — K8309 Other cholangitis: Secondary | ICD-10-CM

## 2019-08-18 DIAGNOSIS — F1010 Alcohol abuse, uncomplicated: Secondary | ICD-10-CM | POA: Diagnosis present

## 2019-08-18 DIAGNOSIS — E861 Hypovolemia: Secondary | ICD-10-CM | POA: Diagnosis present

## 2019-08-18 DIAGNOSIS — K838 Other specified diseases of biliary tract: Secondary | ICD-10-CM | POA: Diagnosis not present

## 2019-08-18 DIAGNOSIS — K8020 Calculus of gallbladder without cholecystitis without obstruction: Secondary | ICD-10-CM | POA: Diagnosis not present

## 2019-08-18 DIAGNOSIS — K828 Other specified diseases of gallbladder: Secondary | ICD-10-CM | POA: Diagnosis present

## 2019-08-18 DIAGNOSIS — K2100 Gastro-esophageal reflux disease with esophagitis, without bleeding: Secondary | ICD-10-CM | POA: Diagnosis not present

## 2019-08-18 DIAGNOSIS — R739 Hyperglycemia, unspecified: Secondary | ICD-10-CM | POA: Diagnosis not present

## 2019-08-18 DIAGNOSIS — Z20828 Contact with and (suspected) exposure to other viral communicable diseases: Secondary | ICD-10-CM | POA: Diagnosis not present

## 2019-08-18 DIAGNOSIS — Z8673 Personal history of transient ischemic attack (TIA), and cerebral infarction without residual deficits: Secondary | ICD-10-CM | POA: Diagnosis not present

## 2019-08-18 DIAGNOSIS — E871 Hypo-osmolality and hyponatremia: Secondary | ICD-10-CM | POA: Diagnosis not present

## 2019-08-18 DIAGNOSIS — Z79899 Other long term (current) drug therapy: Secondary | ICD-10-CM

## 2019-08-18 DIAGNOSIS — Z888 Allergy status to other drugs, medicaments and biological substances status: Secondary | ICD-10-CM

## 2019-08-18 DIAGNOSIS — I6523 Occlusion and stenosis of bilateral carotid arteries: Secondary | ICD-10-CM | POA: Diagnosis not present

## 2019-08-18 DIAGNOSIS — R1011 Right upper quadrant pain: Secondary | ICD-10-CM

## 2019-08-18 DIAGNOSIS — J449 Chronic obstructive pulmonary disease, unspecified: Secondary | ICD-10-CM | POA: Diagnosis present

## 2019-08-18 DIAGNOSIS — K219 Gastro-esophageal reflux disease without esophagitis: Secondary | ICD-10-CM | POA: Diagnosis not present

## 2019-08-18 DIAGNOSIS — K8030 Calculus of bile duct with cholangitis, unspecified, without obstruction: Secondary | ICD-10-CM | POA: Diagnosis not present

## 2019-08-18 DIAGNOSIS — E872 Acidosis: Secondary | ICD-10-CM | POA: Diagnosis not present

## 2019-08-18 DIAGNOSIS — N390 Urinary tract infection, site not specified: Secondary | ICD-10-CM | POA: Diagnosis not present

## 2019-08-18 DIAGNOSIS — Z905 Acquired absence of kidney: Secondary | ICD-10-CM

## 2019-08-18 DIAGNOSIS — N179 Acute kidney failure, unspecified: Secondary | ICD-10-CM | POA: Diagnosis not present

## 2019-08-18 DIAGNOSIS — B961 Klebsiella pneumoniae [K. pneumoniae] as the cause of diseases classified elsewhere: Secondary | ICD-10-CM | POA: Diagnosis present

## 2019-08-18 DIAGNOSIS — R6520 Severe sepsis without septic shock: Secondary | ICD-10-CM | POA: Diagnosis not present

## 2019-08-18 DIAGNOSIS — Z823 Family history of stroke: Secondary | ICD-10-CM

## 2019-08-18 DIAGNOSIS — Z7902 Long term (current) use of antithrombotics/antiplatelets: Secondary | ICD-10-CM | POA: Diagnosis not present

## 2019-08-18 DIAGNOSIS — R7881 Bacteremia: Secondary | ICD-10-CM | POA: Diagnosis not present

## 2019-08-18 DIAGNOSIS — Z86010 Personal history of colonic polyps: Secondary | ICD-10-CM

## 2019-08-18 DIAGNOSIS — K812 Acute cholecystitis with chronic cholecystitis: Secondary | ICD-10-CM | POA: Diagnosis not present

## 2019-08-18 DIAGNOSIS — K660 Peritoneal adhesions (postprocedural) (postinfection): Secondary | ICD-10-CM | POA: Diagnosis not present

## 2019-08-18 DIAGNOSIS — I6381 Other cerebral infarction due to occlusion or stenosis of small artery: Secondary | ICD-10-CM

## 2019-08-18 DIAGNOSIS — K8042 Calculus of bile duct with acute cholecystitis without obstruction: Secondary | ICD-10-CM | POA: Diagnosis not present

## 2019-08-18 DIAGNOSIS — Z9841 Cataract extraction status, right eye: Secondary | ICD-10-CM | POA: Diagnosis not present

## 2019-08-18 DIAGNOSIS — A419 Sepsis, unspecified organism: Secondary | ICD-10-CM | POA: Diagnosis not present

## 2019-08-18 DIAGNOSIS — Z885 Allergy status to narcotic agent status: Secondary | ICD-10-CM

## 2019-08-18 DIAGNOSIS — D62 Acute posthemorrhagic anemia: Secondary | ICD-10-CM | POA: Diagnosis not present

## 2019-08-18 DIAGNOSIS — I6389 Other cerebral infarction: Secondary | ICD-10-CM | POA: Diagnosis not present

## 2019-08-18 DIAGNOSIS — A4159 Other Gram-negative sepsis: Principal | ICD-10-CM | POA: Diagnosis present

## 2019-08-18 DIAGNOSIS — R935 Abnormal findings on diagnostic imaging of other abdominal regions, including retroperitoneum: Secondary | ICD-10-CM | POA: Diagnosis not present

## 2019-08-18 DIAGNOSIS — M1710 Unilateral primary osteoarthritis, unspecified knee: Secondary | ICD-10-CM | POA: Diagnosis present

## 2019-08-18 DIAGNOSIS — M109 Gout, unspecified: Secondary | ICD-10-CM | POA: Diagnosis present

## 2019-08-18 DIAGNOSIS — I6502 Occlusion and stenosis of left vertebral artery: Secondary | ICD-10-CM | POA: Diagnosis not present

## 2019-08-18 DIAGNOSIS — K8050 Calculus of bile duct without cholangitis or cholecystitis without obstruction: Secondary | ICD-10-CM | POA: Diagnosis not present

## 2019-08-18 DIAGNOSIS — E782 Mixed hyperlipidemia: Secondary | ICD-10-CM | POA: Diagnosis not present

## 2019-08-18 DIAGNOSIS — Z1611 Resistance to penicillins: Secondary | ICD-10-CM | POA: Diagnosis not present

## 2019-08-18 DIAGNOSIS — K82A1 Gangrene of gallbladder in cholecystitis: Secondary | ICD-10-CM | POA: Diagnosis not present

## 2019-08-18 DIAGNOSIS — R142 Eructation: Secondary | ICD-10-CM | POA: Diagnosis not present

## 2019-08-18 DIAGNOSIS — E860 Dehydration: Secondary | ICD-10-CM | POA: Diagnosis present

## 2019-08-18 DIAGNOSIS — Z87891 Personal history of nicotine dependence: Secondary | ICD-10-CM

## 2019-08-18 DIAGNOSIS — Z8261 Family history of arthritis: Secondary | ICD-10-CM

## 2019-08-18 DIAGNOSIS — K8066 Calculus of gallbladder and bile duct with acute and chronic cholecystitis without obstruction: Secondary | ICD-10-CM | POA: Diagnosis not present

## 2019-08-18 DIAGNOSIS — E785 Hyperlipidemia, unspecified: Secondary | ICD-10-CM | POA: Diagnosis present

## 2019-08-18 MED ORDER — ACETAMINOPHEN 650 MG RE SUPP: 650.0000 mg | Freq: Four times a day (QID) | RECTAL | Status: DC | PRN

## 2019-08-18 MED ORDER — ONDANSETRON HCL 4 MG PO TABS
4.0000 mg | ORAL_TABLET | Freq: Four times a day (QID) | ORAL | Status: DC | PRN
  Administered 2019-08-21: 4 mg via ORAL
  Filled 2019-08-18: qty 1

## 2019-08-18 MED ORDER — ZOLPIDEM TARTRATE 5 MG PO TABS
5.0000 mg | ORAL_TABLET | Freq: Every evening | ORAL | Status: DC | PRN
  Administered 2019-08-18 – 2019-08-21 (×4): 5 mg via ORAL
  Filled 2019-08-18 (×4): qty 1

## 2019-08-18 MED ORDER — ONDANSETRON HCL 4 MG/2ML IJ SOLN
4.0000 mg | Freq: Four times a day (QID) | INTRAMUSCULAR | Status: DC | PRN
  Administered 2019-08-19 – 2019-08-24 (×7): 4 mg via INTRAVENOUS
  Filled 2019-08-18 (×7): qty 2

## 2019-08-18 MED ORDER — MORPHINE SULFATE (PF) 2 MG/ML IV SOLN
1.0000 mg | INTRAVENOUS | Status: DC | PRN
  Administered 2019-08-20 – 2019-08-24 (×4): 1 mg via INTRAVENOUS
  Filled 2019-08-18 (×4): qty 1

## 2019-08-18 MED ORDER — ACETAMINOPHEN 325 MG PO TABS
650.0000 mg | ORAL_TABLET | Freq: Four times a day (QID) | ORAL | Status: DC | PRN
  Administered 2019-08-19 – 2019-08-23 (×4): 650 mg via ORAL
  Filled 2019-08-18 (×3): qty 2

## 2019-08-18 MED ORDER — SODIUM CHLORIDE 0.9% FLUSH: 10.0000 mL | INTRAVENOUS | Status: DC | PRN

## 2019-08-18 NOTE — Telephone Encounter (Signed)
Copied from [place] [id number]. Topic: General - Other >> Aug 18, 2019  8:54 AM Keene Breath wrote: Reason for CRM: Patient called to request some Ambien to help him sleep.  He stated that he was at the ER Monday and since then he has note been able to sleep.  Please call to discuss at [phone]

## 2019-08-18 NOTE — H&P (Signed)
History and Physical    Joseph Orr ASN:[id number] DOB: 1951-05-02 DOA: 08/18/2019  PCP: [place], Betty G, MD  Patient coming from: Direct admission per general surgery  I have personally briefly reviewed patient's old medical records in [place]  Chief Complaint: Symptomatic cholelithiasis  HPI: Joseph Orr is a 68 y.o. male with medical history significant for history of CVA, GERD, hyperlipidemia, and alcohol use who was directly admitted for further evaluation management of right-sided abdominal pain.  Patient reports several weeks of indigestion and frequent belching.  Over the last week he has developed associated nausea with vomiting and watery diarrhea which has now resolved.  He has been having abdominal pain, more pronounced in the right upper quadrant but also with some epigastric pain.  He has had decreased oral intake.  He denies any subjective fevers but has had diaphoresis.  He denies any dysuria.  On 08/15/2019 he developed chest pain as well as abdominal pain with nausea and vomiting.  He was seen in the ED 08/15/2019 at which time troponins were negative.  He had labs notable for AST 75, ALT 35, alk phos 90, total bilirubin 1.9.  CTA chest/abdomen/pelvis dissection study was obtained which was negative for aortic dissection or aneurysm.  Mild pericholecystic haziness with possible gallbladder sludge was noted.  He was discharged to home with GI follow-up and also RUQ ultrasound.  RUQ ultrasound 08/17/2019 showed minimal cholelithiasis with large amount of sludge, CBD diameter 8 mm.  He was seen by GI who felt symptoms were related to the gallbladder and he was referred to general surgery.  He was seen by Dr. Redmond Pulling with [facility] surgery who recommended he be admitted for expedited neurological evaluation given recent stroke history to determine surgical risk for cholecystectomy as he would otherwise be considered for percutaneous drain by IR.   He has a history of stroke with reported transient RUE sensation and fine motor deficits occurring in April 2020 for which he did not seek medical attention immediately.  He had MRI brain performed 03/15/2019 which did show a subacute appearing lacunar infarct of the left thalamus/internal capsule.  He was started on dual antiplatelet therapy with aspirin and Plavix x3 months and is now taking Plavix 75 mg alone daily with last dose taken morning of 08/18/2019.  He reports intolerance to atorvastatin and rosuvastatin.  He has not had formal evaluation by neurology.  He denies any known cardiac history.  He is a former smoker of approximately 20 pack years.  He had PFTs performed June 2019 which were negative for underlying lung disease.  He reports daily alcohol use, 5-6 beers per day.  He denies any history of alcohol withdrawal.  He denies any illicit drug use.   Review of Systems: All systems reviewed and are negative except as documented in history of present illness above.   Past Medical History:  Diagnosis Date  . Arthritis   . GERD (gastroesophageal reflux disease)   . Gout   . TIA (transient ischemic attack) 02/2019    Past Surgical History:  Procedure Laterality Date  . COLONOSCOPY     +8y  . KIDNEY DONATION    . KNEE ARTHROSCOPY      Social History:  reports that he quit smoking about 20 years ago. He has never used smokeless tobacco. He reports current alcohol use of about 3.0 - 4.0 standard drinks of alcohol per week. He reports that he does not use drugs.  Allergies  Allergen Reactions  .  Oxycodone-Acetaminophen Nausea And Vomiting    vomiting  . Atorvastatin Nausea And Vomiting  . Rosuvastatin Nausea And Vomiting    Family History  Problem Relation Age of Onset  . Arthritis Mother   . Arthritis Father   . Pneumonia Father        Died age 38  . Stroke Brother      Prior to Admission medications   Medication Sig Start Date End Date Taking? Authorizing  Provider  acetaminophen (TYLENOL) 325 MG tablet Take 650 mg by mouth every 6 (six) hours as needed.    [provider]  albuterol (PROVENTIL HFA;VENTOLIN HFA) 108 (90 Base) MCG/ACT inhaler Inhale 2 puffs into the lungs every 4 (four) hours as needed for wheezing or shortness of breath. 04/06/18   Martinique, Betty G, MD  allopurinol (ZYLOPRIM) 100 MG tablet TAKE 1 TABLET BY MOUTH DAILY Patient taking differently: Take 100 mg by mouth daily.  04/19/19   Martinique, Betty G, MD  cetirizine (ZYRTEC) 10 MG tablet Take 10 mg by mouth daily.    [provider]  clopidogrel (PLAVIX) 75 MG tablet TAKE 1 TABLET BY MOUTH DAILY Patient taking differently: Take 75 mg by mouth daily.  06/15/19   [place], Betty G, MD  ondansetron (ZOFRAN) 4 MG tablet Take 1 tablet (4 mg total) by mouth every 6 (six) hours. 08/15/19   Hayden Rasmussen, MD  pantoprazole (PROTONIX) 20 MG tablet Take 1 tablet (20 mg total) by mouth daily. 06/21/19   [place], Betty G, MD  prednisoLONE acetate (PRED FORTE) 1 % ophthalmic suspension Place 1 drop into the right eye daily. 06/29/19   [provider]    Physical Exam: Vitals:   08/18/19 2020 08/19/19 0112  BP: (!) 151/80   Pulse: 82   Resp: 14   Temp: 98.7 F (37.1 C)   TempSrc: Oral   SpO2: 99%   Weight:  87.1 kg  Height:  5' 9"  (1.753 m)    Constitutional: Resting supine in bed, NAD, calm, comfortable Eyes: PERRL, lids and conjunctivae normal ENMT: Mucous membranes are moist. Posterior pharynx clear of any exudate or lesions. Neck: normal, supple, no masses. Respiratory: clear to auscultation bilaterally, no wheezing, no crackles. Normal respiratory effort. No accessory muscle use.  Cardiovascular: Regular rate and rhythm, no murmurs / rubs / gallops. No extremity edema. 2+ pedal pulses. Abdomen: Tender to palpation right abdominal and epigastric regions, more pronounced in the RUQ. no masses palpated. No hepatosplenomegaly. Bowel sounds positive.   Musculoskeletal: no clubbing / cyanosis. No joint deformity upper and lower extremities. Good ROM, no contractures. Normal muscle tone.  Skin: no rashes, lesions, ulcers. No induration Neurologic: CN 2-12 grossly intact. Sensation intact, Strength 5/5 in all 4.  Psychiatric: Normal judgment and insight. Alert and oriented x 3. Normal mood.     Labs on Admission: I have personally reviewed following labs and imaging studies  CBC: Recent Labs  Lab 08/15/19 1454 08/18/19 2322  WBC 8.9 12.4*  NEUTROABS  --  9.8*  HGB 13.9 13.2  HCT 40.8 37.4*  MCV 99.0 97.1  PLT 151 301*   Basic Metabolic Panel: Recent Labs  Lab 08/15/19 1454 08/18/19 2322  NA 130* 123*  K 4.4 4.6  CL 94* 87*  CO2 24 23  GLUCOSE 137* 95  BUN 9 15  CREATININE 1.03 1.09  CALCIUM 8.8* 8.7*   GFR: Estimated Creatinine Clearance: 70.9 mL/min (by C-G formula based on SCr of 1.09 mg/dL). Liver Function Tests: Recent Labs  Lab 08/15/19 1454 08/18/19 2322  AST 75* 37  ALT 35 31  ALKPHOS 90 68  BILITOT 1.9* 3.2*  PROT 6.6 6.1*  ALBUMIN 3.8 3.0*   Recent Labs  Lab 08/15/19 1454  LIPASE 32   No results for input(s): AMMONIA in the last 168 hours. Coagulation Profile: No results for input(s): INR, PROTIME in the last 168 hours. Cardiac Enzymes: No results for input(s): CKTOTAL, CKMB, CKMBINDEX, TROPONINI in the last 168 hours. BNP (last 3 results) No results for input(s): PROBNP in the last 8760 hours. HbA1C: No results for input(s): HGBA1C in the last 72 hours. CBG: No results for input(s): GLUCAP in the last 168 hours. Lipid Profile: No results for input(s): CHOL, HDL, LDLCALC, TRIG, CHOLHDL, LDLDIRECT in the last 72 hours. Thyroid Function Tests: No results for input(s): TSH, T4TOTAL, FREET4, T3FREE, THYROIDAB in the last 72 hours. Anemia Panel: No results for input(s): VITAMINB12, FOLATE, FERRITIN, TIBC, IRON, RETICCTPCT in the last 72 hours. Urine analysis: No results found for:  COLORURINE, APPEARANCEUR, [place], [place], GLUCOSEU, HGBUR, BILIRUBINUR, KETONESUR, PROTEINUR, UROBILINOGEN, NITRITE, LEUKOCYTESUR  Radiological Exams on Admission: US Abdomen Limited Ruq  Result Date: 08/17/2019 CLINICAL DATA:  Upper abdominal pain. EXAM: ULTRASOUND ABDOMEN LIMITED RIGHT UPPER QUADRANT COMPARISON:  None. FINDINGS: Gallbladder: Minimal cholelithiasis and large amount of sludge is noted within the gallbladder lumen. No gallbladder wall thickening or pericholecystic fluid is noted. No sonographic Murphy's sign is noted. Common bile duct: Diameter: 8 mm which is borderline dilated for age. Liver: No focal lesion identified. Within normal limits in parenchymal echogenicity. Portal vein is patent on color Doppler imaging with normal direction of blood flow towards the liver. Other: None. IMPRESSION: Minimal cholelithiasis and large amount of sludge is noted within gallbladder lumen. No gallbladder wall thickening or pericholecystic fluid is noted. Borderline dilatation of common bile duct is noted. Correlation with liver function tests is recommended to rule out obstruction. Electronically Signed   By: Marijo Conception M.D.   On: 08/17/2019 07:58    EKG: Ordered and pending.  Assessment/Plan Active Problems:   Hyperlipidemia   GERD (gastroesophageal reflux disease)   RUQ abdominal pain   History of CVA (cerebrovascular accident)  Joseph Orr is a 68 y.o. male with medical history significant for history of CVA, GERD, hyperlipidemia, and alcohol use who is admitted for symptomatic cholelithiasis.  Symptomatic cholelithiasis: General surgery following and requesting neurology evaluation to assess risk for surgical intervention given recent CVA.  If high risk from neurological standpoint, may need percutaneous drain by IR.  From a cardiac standpoint he has a 6.0% preoperative 30-day risk of death, MI, or cardiac arrest based on the revised cardiac risk index due to his history  of CVA. -General surgery following, discussed with Dr. Redmond Pulling -Clear liquids for now -Continue as needed pain control and antiemetics -Will need neurology consultation -Continue to hold Plavix  History of left thalamus/internal capsule lacunar infarct: Subacute infarct seen on MRI brain 03/15/2019.  Has not had formal stroke work-up. -Check echocardiogram, carotid Dopplers -Holding Plavix -Patient reports intolerance to atorvastatin and rosuvastatin -Request neurology evaluation in a.m. for preoperative risk assessment, further recommendations  Hyponatremia: 123 from 130 four days ago.  Suspect hypovolemic hyponatremia from decreased oral intake.  Check urine studies and start maintenance IV fluids and repeat labs in a.m.  Hyperlipidemia: Patient not currently on statin therapy as he reports intolerance to atorvastatin and rosuvastatin  Alcohol use: Reports 5-6 beers per day and occasional liquor drink as well.  Denies any history of withdrawal.  Will monitor with CIWA checks.   DVT prophylaxis: SCDs Code Status: Full code, confirmed with patient Family Communication: Discussed with daughter at bedside Disposition Plan: Pending clinical progress Consults called: General surgery following Admission status: Inpatient, patient likely requires greater than 2 midnight length stay for management of symptomatic cholelithiasis and preoperative neurological risk assessment.   Zada Finders MD [facility]  If 7PM-7AM, please contact night-coverage [email or web address]  08/19/2019, 2:11 AM

## 2019-08-18 NOTE — Progress Notes (Signed)
Patient who is a direct admit from doctor's arrived  to room 6N06. Alert and oriented x4. Vital signs stable. No c/o pain at this time. Oriented to room and remote. Paged admitting doctor.

## 2019-08-18 NOTE — Progress Notes (Signed)
Dr. Posey Pronto aware of patient on the floor.

## 2019-08-19 ENCOUNTER — Inpatient Hospital Stay (HOSPITAL_COMMUNITY): Payer: Medicare HMO

## 2019-08-19 ENCOUNTER — Other Ambulatory Visit: Payer: Self-pay

## 2019-08-19 ENCOUNTER — Encounter (HOSPITAL_COMMUNITY): Payer: Self-pay | Admitting: Primary Care

## 2019-08-19 DIAGNOSIS — K2100 Gastro-esophageal reflux disease with esophagitis, without bleeding: Secondary | ICD-10-CM

## 2019-08-19 DIAGNOSIS — E782 Mixed hyperlipidemia: Secondary | ICD-10-CM

## 2019-08-19 DIAGNOSIS — I6389 Other cerebral infarction: Secondary | ICD-10-CM

## 2019-08-19 DIAGNOSIS — Z8673 Personal history of transient ischemic attack (TIA), and cerebral infarction without residual deficits: Secondary | ICD-10-CM

## 2019-08-19 LAB — SODIUM, URINE, RANDOM: Sodium, Ur: <10 mmol/L

## 2019-08-19 LAB — CBC WITH DIFFERENTIAL/PLATELET
Abs Immature Granulocytes: 0.06 10*3/uL (ref 0.00–0.07)
Basophils Absolute: 0 10*3/uL (ref 0.0–0.1)
Basophils Relative: 0 %
Eosinophils Absolute: 0 10*3/uL (ref 0.0–0.5)
Eosinophils Relative: 0 %
HCT: 37.4 % — ABNORMAL LOW (ref 39.0–52.0)
Hemoglobin: 13.2 g/dL (ref 13.0–17.0)
Immature Granulocytes: 1 %
Lymphocytes Relative: 5 %
Lymphs Abs: 0.6 10*3/uL — ABNORMAL LOW (ref 0.7–4.0)
MCH: 34.3 pg — ABNORMAL HIGH (ref 26.0–34.0)
MCHC: 35.3 g/dL (ref 30.0–36.0)
MCV: 97.1 fL (ref 80.0–100.0)
Monocytes Absolute: 1.9 10*3/uL — ABNORMAL HIGH (ref 0.1–1.0)
Monocytes Relative: 15 %
Neutro Abs: 9.8 10*3/uL — ABNORMAL HIGH (ref 1.7–7.7)
Neutrophils Relative %: 79 %
Platelets: 127 10*3/uL — ABNORMAL LOW (ref 150–400)
RBC: 3.85 MIL/uL — ABNORMAL LOW (ref 4.22–5.81)
RDW: 11.6 % (ref 11.5–15.5)
WBC: 12.4 10*3/uL — ABNORMAL HIGH (ref 4.0–10.5)
nRBC: 0 % (ref 0.0–0.2)

## 2019-08-19 LAB — BASIC METABOLIC PANEL
Anion gap: 13 (ref 5–15)
BUN: 15 mg/dL (ref 8–23)
CO2: 23 mmol/L (ref 22–32)
Calcium: 8.7 mg/dL — ABNORMAL LOW (ref 8.9–10.3)
Chloride: 87 mmol/L — ABNORMAL LOW (ref 98–111)
Creatinine, Ser: 1.09 mg/dL (ref 0.61–1.24)
GFR calc Af Amer: >60 mL/min (ref >60)
GFR calc non Af Amer: >60 mL/min (ref >60)
Glucose, Bld: 95 mg/dL (ref 70–99)
Potassium: 4.6 mmol/L (ref 3.5–5.1)
Sodium: 123 mmol/L — ABNORMAL LOW (ref 135–145)

## 2019-08-19 LAB — COMPREHENSIVE METABOLIC PANEL
ALT: 27 U/L (ref 0–44)
AST: 32 U/L (ref 15–41)
Albumin: 2.8 g/dL — ABNORMAL LOW (ref 3.5–5.0)
Alkaline Phosphatase: 63 U/L (ref 38–126)
Anion gap: 12 (ref 5–15)
BUN: 13 mg/dL (ref 8–23)
CO2: 22 mmol/L (ref 22–32)
Calcium: 8.1 mg/dL — ABNORMAL LOW (ref 8.9–10.3)
Chloride: 89 mmol/L — ABNORMAL LOW (ref 98–111)
Creatinine, Ser: 0.98 mg/dL (ref 0.61–1.24)
GFR calc Af Amer: >60 mL/min (ref >60)
GFR calc non Af Amer: >60 mL/min (ref >60)
Glucose, Bld: 96 mg/dL (ref 70–99)
Potassium: 3.4 mmol/L — ABNORMAL LOW (ref 3.5–5.1)
Sodium: 123 mmol/L — ABNORMAL LOW (ref 135–145)
Total Bilirubin: 3.1 mg/dL — ABNORMAL HIGH (ref 0.3–1.2)
Total Protein: 5.7 g/dL — ABNORMAL LOW (ref 6.5–8.1)

## 2019-08-19 LAB — HEPATIC FUNCTION PANEL
ALT: 31 U/L (ref 0–44)
AST: 37 U/L (ref 15–41)
Albumin: 3 g/dL — ABNORMAL LOW (ref 3.5–5.0)
Alkaline Phosphatase: 68 U/L (ref 38–126)
Bilirubin, Direct: 1.6 mg/dL — ABNORMAL HIGH (ref 0.0–0.2)
Indirect Bilirubin: 1.6 mg/dL — ABNORMAL HIGH (ref 0.3–0.9)
Total Bilirubin: 3.2 mg/dL — ABNORMAL HIGH (ref 0.3–1.2)
Total Protein: 6.1 g/dL — ABNORMAL LOW (ref 6.5–8.1)

## 2019-08-19 LAB — CBC
HCT: 36.5 % — ABNORMAL LOW (ref 39.0–52.0)
Hemoglobin: 12.9 g/dL — ABNORMAL LOW (ref 13.0–17.0)
MCH: 34.2 pg — ABNORMAL HIGH (ref 26.0–34.0)
MCHC: 35.3 g/dL (ref 30.0–36.0)
MCV: 96.8 fL (ref 80.0–100.0)
Platelets: 126 10*3/uL — ABNORMAL LOW (ref 150–400)
RBC: 3.77 MIL/uL — ABNORMAL LOW (ref 4.22–5.81)
RDW: 11.6 % (ref 11.5–15.5)
WBC: 12 10*3/uL — ABNORMAL HIGH (ref 4.0–10.5)
nRBC: 0 % (ref 0.0–0.2)

## 2019-08-19 LAB — ECHOCARDIOGRAM COMPLETE
Height: 69 in
Weight: 3072.01 oz

## 2019-08-19 LAB — OSMOLALITY: Osmolality: 263 mOsm/kg — ABNORMAL LOW (ref 275–295)

## 2019-08-19 LAB — HIV ANTIBODY (ROUTINE TESTING W REFLEX): HIV Screen 4th Generation wRfx: NONREACTIVE

## 2019-08-19 LAB — OSMOLALITY, URINE: Osmolality, Ur: 267 mOsm/kg — ABNORMAL LOW (ref 300–900)

## 2019-08-19 MED ORDER — GADOBUTROL 1 MMOL/ML IV SOLN
8.0000 mL | Freq: Once | INTRAVENOUS | Status: AC | PRN
  Administered 2019-08-19: 8 mL via INTRAVENOUS

## 2019-08-19 MED ORDER — SODIUM CHLORIDE 0.9 % IV SOLN
INTRAVENOUS | Status: DC
  Administered 2019-08-19 – 2019-08-20 (×4): via INTRAVENOUS

## 2019-08-19 MED ORDER — IOHEXOL 350 MG/ML SOLN
100.0000 mL | Freq: Once | INTRAVENOUS | Status: AC | PRN
  Administered 2019-08-19: 100 mL via INTRAVENOUS

## 2019-08-19 MED ORDER — SODIUM CHLORIDE 0.9 % IV SOLN
INTRAVENOUS | Status: AC
  Administered 2019-08-19: 03:00:00 via INTRAVENOUS

## 2019-08-19 NOTE — Plan of Care (Signed)
  Problem: Education: Goal: Knowledge of General Education information will improve Description Including pain rating scale, medication(s)/side effects and non-pharmacologic comfort measures Outcome: Progressing   

## 2019-08-19 NOTE — Plan of Care (Signed)
  Problem: Health Behavior/Discharge Planning: Goal: Ability to manage health-related needs will improve Outcome: Progressing   

## 2019-08-19 NOTE — Telephone Encounter (Signed)
I have not prescribed Ambien. Appt should have been scheduled.  Thanks,  BJ

## 2019-08-19 NOTE — Progress Notes (Addendum)
PROGRESS NOTE  Joseph Orr IWL:[id number] DOB: [birth year] DOA: 08/18/2019 PCP: [place], Betty G, MD  Brief Narrative: Patient admitted for abdominal pain he said that she also had a stroke in April he had a partial work-up.  He was admitted for abdominal pain.  Surgery has seen patient and would like to have neurology clearance in case patient needs to have surgery.  Neurology was consulted.  Consult appreciated   Interval history/Subjective: Patient stated she is doing much better abdominal pain is improved he was able to sleep for the first time in 5 days today  Assessment/Plan Symptomatic cholelithiasis..  Surgery has been consulted and is considering possible laparoscopy but he is requesting neurology clearance due to his recent lacunar infarct history.  2.  Lacunar infarct in April 2020 I have consulted with neurology and he has been cleared for possible surgery if is a candidate.  His Plavix is currently on hold  Hyponatremia will replace and continue to monitor  Hyperlipidemia.  Currently not on statin because of intolerance    DVT prophylaxis: SCD Code Status: FULL Family Communication: None at bedside Disposition Plan: PENDING PROGRESS Time spent: 35 minutes Dr. Barrie Folk [facility] Pager [phone]  08/19/2019, 9:54 AM  LOS: 1 day   Consultants:  NEUROLOGY  GI  SURGERY  Procedures:  None  Antimicrobials:  Unasyn   Objective: Vitals:  Vitals:   08/19/19 0522 08/19/19 0522  BP: (!) 147/78 (!) 147/78  Pulse: 90 89  Resp:    Temp: 99.4 F (37.4 C) 99.4 F (37.4 C)  SpO2: 97% 96%    Exam:  Constitutional:  . Appears calm and comfortable Eyes:  . pupils and irises appear normal . Normal lids and conjunctivae ENMT:  . grossly normal hearing  . Lips appear normal . external ears, nose appear normal . Oropharynx: mucosa, tongue,posterior pharynx appear normal Neck:  . neck appears normal, no masses, normal ROM, supple . no  thyromegaly Respiratory:  . CTA bilaterally, no w/r/r.  . Respiratory effort normal. No retractions or accessory muscle use Cardiovascular:  . RRR, no m/r/g . No LE extremity edema   . Normal pedal pulses Abdomen:  . Abdomen appears normal; vague tenderness no rebound no guarding tenderness or masses . No hernias . No HSM Musculoskeletal:  . Digits/nails BUE: no clubbing, cyanosis, petechiae, infection . exam of joints, bones, muscles of at least one of following: head/neck, RUE, LUE, RLE, LLE   o strength and tone normal, no atrophy, no abnormal movements o No tenderness, masses o Normal ROM, no contractures  Skin:  . No rashes, lesions, ulcers . palpation of skin: no induration or nodules Neurologic:  . CN 2-12 intact . Sensation all 4 extremities intact Psychiatric:  . Mental status o Mood, affect appropriate o Orientation to person, place, time  . judgment and insight appear intact     I have personally reviewed the following:   Data: 16/20 0943  Osmolality, urine  Collected: 08/19/19 4818  Final result  Specimen: Urine, Random   Osmolality, Urine 267Low  mOsm/kg          08/19/19 0936  Osmolality  Collected: 08/19/19 0500  Final result  Specimen: Blood   Osmolality 263Low  mOsm/kg          08/19/19 0753  Sodium, urine, random  Collected: 08/19/19 5631  Final result  Specimen: Urine, Random   Sodium, Urine <10 mmol/L          08/19/19 0630  Comprehensive metabolic panel  Collected: 08/19/19 0500  Edited Result - FINAL  Specimen: Blood   Sodium 123Low  C mmol/L Albumin 2.8Low  C g/dL  Potassium 3.4Low  C mmol/L  AST 32 C U/L  Chloride 89Low  C mmol/L ALT 27 C U/L  CO2 22 C mmol/L Alkaline Phosphatase 63 C U/L  Glucose 96 C mg/dL Total Bilirubin 3.1High  C mg/dL  BUN 13 C mg/dL GFR, Est Non African American >60 C mL/min  Creatinine 0.98 C mg/dL GFR, Est African American >60 C mL/min  Calcium 8.1Low  C mg/dL Anion gap 12 C   Total Protein 5.7Low  C  g/dL         08/19/19 0629  CBC  Collected: 08/19/19 0500  Final result  Specimen: Blood   WBC 12.0High  K/uL MCH 34.2High  pg  RBC 3.77Low  MIL/uL MCHC 35.3 g/dL  Hemoglobin 12.9Low  g/dL RDW 11.6 %  HCT 36.5Low  % Platelets 126Low  K/uL   MCV 96.8 fL nRBC 0.0 %        08/19/19 0305  HIV Antibody (routine testing w rflx)  Collected: 08/18/19 2322  Final result  Specimen: Blood   HIV Screen 4th Generation wRfx NON REACTIVE          08/19/19 9983  Basic metabolic panel  Collected: 08/18/19 2322  Final result  Specimen: Blood   Sodium 123Low  mmol/L Creatinine 1.09 mg/dL  Potassium 4.6 mmol/L Calcium 8.7Low  mg/dL  Chloride 87Low  mmol/L GFR, Est Non African American >60 mL/min  CO2 23 mmol/L GFR, Est African American >60 mL/min  Glucose 95 mg/dL Anion gap 13   BUN 15 mg/dL         08/19/19 0052  Hepatic function panel  Collected: 08/18/19 2322  Final result  Specimen: Blood   Total Protein 6.1Low  g/dL Alkaline Phosphatase 68 U/L  Albumin 3.0Low  g/dL Total Bilirubin 3.2High  mg/dL  AST 37 U/L Bilirubin, Direct 1.6High  mg/dL  ALT 31 U/L Indirect Bilirubin 1.6High  mg/dL           Active Problems:   Hyperlipidemia   GERD (gastroesophageal reflux disease)   RUQ abdominal pain   History of CVA (cerebrovascular accident)   LOS: 1 day

## 2019-08-19 NOTE — Progress Notes (Signed)
Carotid duplex has been completed.   Preliminary results in CV Proc.   Joseph Orr 08/19/2019 9:59 AM

## 2019-08-19 NOTE — TOC Initial Note (Signed)
Transition of Care [facility]) - Initial/Assessment Note    Patient Details  Name: Joseph Orr MRN: [id number] Date of Birth: [birth year]  Transition of Care [facility]) CM/SW Contact:    Alexander Mt, [place] Phone Number: 08/19/2019, 4:47 PM  Clinical Narrative:                 CSW met with pt at bedside. Introduced self, role, reason for visit. Pt from home with spouse Gay Filler, his daughter Jinny Blossom works for the health system. Confirmed home address, phone numbers, and PCP. At this time pt would like to change his PCP after discharge. We discussed that pt could utilize his [place] Medicare help line to find in network provider or the [facility] physician referral line [phone]. Pt prefers [facility] referral Line. Will leave on discharge instructions. At this time pt ambulates with no assistive devices per his report and denies difficulty with transportation or obtaining medications.  Discussed referral to [facility] services and gave a brief overview; pt amenable to referral and this was given to Dannielle Huh and Natividad Brood who will place pt on list for a visit.   TOC team remains available as needed during pt hospitalization.   Expected Discharge Plan: Home/Self Care Barriers to Discharge: Continued Medical Work up   Patient Goals and CMS Choice Patient states their goals for this hospitalization and ongoing recovery are:: to find a new PCP and get some rest CMS Medicare.gov Compare Post Acute Care list provided to:: Other (Comment Required)(n/a) Choice offered to / list presented to : NA  Expected Discharge Plan and Services Expected Discharge Plan: Home/Self Care In-house Referral: Clinical Social Work, PCP / Psychologist, educational, [facility] Discharge Planning Services: CM Consult Post Acute Care Choice: NA Living arrangements for the past 2 months: Apartment  Prior Living Arrangements/Services Living arrangements for the past 2 months: Apartment Lives with:: Spouse Patient language and  need for interpreter reviewed:: Yes(no needs) Do you feel safe going back to the place where you live?: Yes      Need for Family Participation in Patient Care: Yes (Comment)(support as needed) Care giver support system in place?: Yes (comment)(spouse; adult children) Current home services: DME Criminal Activity/Legal Involvement Pertinent to Current Situation/Hospitalization: No - Comment as needed  Activities of Daily Living Home Assistive Devices/Equipment: Eyeglasses ADL Screening (condition at time of admission) Patient's cognitive ability adequate to safely complete daily activities?: Yes Is the patient deaf or have difficulty hearing?: No Does the patient have difficulty seeing, even when wearing glasses/contacts?: No Does the patient have difficulty concentrating, remembering, or making decisions?: No Patient able to express need for assistance with ADLs?: Yes Does the patient have difficulty dressing or bathing?: No Independently performs ADLs?: Yes (appropriate for developmental age) Does the patient have difficulty walking or climbing stairs?: No Weakness of Legs: None Weakness of Arms/Hands: None  Permission Sought/Granted Permission sought to share information with : Family Supports Permission granted to share information with : Yes, Verbal Permission Granted  Share Information with NAME: Filimon Miranda; [name]  Permission granted to share info w AGENCY: [place] granted to share info w Relationship: spouse; daughter  Permission granted to share info w Contact Information: [phone]  Emotional Assessment Appearance:: Appears stated age Attitude/Demeanor/Rapport: Engaged Affect (typically observed): Quiet, Appropriate, Adaptable, Accepting Orientation: : Oriented to Situation, Oriented to  Time, Oriented to Place, Oriented to Self Alcohol / Substance Use: Alcohol Use(light beer throughout the week) Psych Involvement: No (comment)  Admission diagnosis:  cholesytitis Patient Active Problem List   Diagnosis Date Noted  . RUQ abdominal pain 08/18/2019  . History of CVA (cerebrovascular accident) 08/18/2019  . Abdominal pain, epigastric 08/17/2019  . Nausea and vomiting 08/17/2019  . Abnormal gallbladder ultrasound 08/17/2019  . Other constipation 08/17/2019  . Left sided lacunar infarction ([place]) 04/04/2019  . COPD (chronic obstructive pulmonary disease) ([place]) 04/06/2018  . GERD (gastroesophageal reflux disease) 04/06/2018  . Hyperlipidemia 07/21/2017  . Gouty arthritis 06/03/2016  . Osteoarthritis of knee 06/03/2016   PCP:  Martinique, Betty G, MD Pharmacy:   [facility] [address], [place] [address] Phone: [phone] Fax: [phone]  [id number] [id number]: [phone]: [phone]    Social Determinants of Health (SDOH) Interventions    Readmission Risk Interventions Readmission Risk Prevention Plan 08/19/2019  Post Dischage Appt Not Complete  Appt Comments pt in process of changing PCP's  Medication Screening Complete  Transportation Screening Complete  Some recent data might be hidden

## 2019-08-19 NOTE — Consult Note (Signed)
Joseph Orr Documented: 08/18/2019 4:26 PM Location: [facility] Patient #: [id number] DOB: 1951/11/02 Married / Language: Joseph Orr / Race: White Male   History of Present Illness Joseph Orr M. Joseph Pitones Orr; 08/18/2019 5:54 PM) The patient is a 68 year old male who presents with abdominal pain. He is referred by Dr Fuller Plan for evaluation of upper abdominal pain and nausea/vomiting. He is accompanied by his daughter. He states on Monday he had severe upper abdominal and lower chest pain vomiting phlegm and a lot of belching and it did not get better so he presented to the emergency room. In the emergency room he underwent a CT angio which was negative for PE. Reportedly his cardiac evaluation was unremarkable. There was some haziness potentially around the gallbladder so this prompted an ultrasound which showed some gallstones and large amount of sludge but no wall thickening or pericholecystic fluid and a borderline common bile duct at 8 mm. He ended up seeing Joseph Orr yesterday and was felt to have more components of symptomatic gallbladder problems and was told to see surgery. However there was some concerns that he may have some gastric issues as well. His primary complaints still are now right-sided pain. Mild amount of nausea he is still burping at times. He does drink 5-6 alcoholic beverages per day but has not had any withdrawal symptoms. None of the imaging showed splenomegaly at, cirrhosis or varices. He states over the past month or so he has had abdominal discomfort in the upper abdomen every 2-3 days. He will have several episodes in 1 day lasting 10-15 minutes where he will have abdominal discomfort in the upper abdomen mainly in the epigastric and right side associated with belching and vomiting phlegm. He does not take NSAIDs.  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  He is on Plavix. He was placed on this after neurological event on April 30. He states  that he had right forearm numbness and had difficulty walking and balance issues. He contacted his PCP and he ended up getting an MRI of his brain which showed a subacute lacunar infarct on the left. He had no additional workup such as carotid duplex or echocardiogram. He currently denies any TIAs or amaurosis fugax.  He had a somewhat elevated total bilirubin of 1.93 days ago along with an elevated AST of 75 but he does have a somewhat chronically elevated AST level. Otherwise his LFTs and labs were unremarkable   Problem List/Past Medical Joseph Orr M. 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 Nephrectomy  Right.  Diagnostic Studies History Joseph Orr, [place]; 08/18/2019 4:26 PM) Colonoscopy  5-10 years ago  Allergies Joseph Orr, Orr; 08/18/2019 4:26 PM) No Known Allergies  [08/18/2019]: No Known Drug Allergies  [08/18/2019]: Allergies Reconciled   Medication History Joseph Orr, Orr; 08/18/2019 4:27 PM) Rosuvastatin Calcium (10MG  Tablet, Oral) Active. prednisoLONE Acetate (1% Suspension, Ophthalmic) Active. Pantoprazole Sodium (20MG  Tablet DR, Oral) Active. Ondansetron HCl (4MG  Tablet, Oral) Active. Omeprazole (20MG  Capsule DR, Oral) Active. Ofloxacin (0.3% Solution, Ophthalmic) Active. Ketorolac Tromethamine (0.4% Solution, Ophthalmic) Active. Atorvastatin Calcium (10MG  Tablet, Oral) Active. Allopurinol (100MG  Tablet, Oral) Active. Clopidogrel Bisulfate (75MG  Tablet, Oral) Active. Desonide (0.05% Ointment, External) Active. Medications Reconciled  Social History Joseph Orr, [place]; 08/18/2019 4:26 PM) Alcohol use  Moderate alcohol use. No caffeine use  No drug use  Tobacco use   Former smoker.  Family History Joseph Orr([place]; 08/18/2019 4:26 PM) Alcohol Abuse  Brother. Hypertension  Father. Respiratory Condition  Brother.  Other Problems Joseph Orr(Joseph Orr; 08/18/2019 6:00 PM) Cerebrovascular Accident  Chest pain     Review of Systems (Joseph Orr; 08/18/2019 4:26 PM) General Present- Appetite Loss and Fatigue. Not Present- Chills, Fever, Night Sweats, Weight Gain and Weight Loss. HEENT Present- Seasonal Allergies. Not Present- Earache, Hearing Loss, Hoarseness, Nose Bleed, Oral Ulcers, Ringing in the Ears, Sinus Pain, Sore Throat, Visual Disturbances, Wears glasses/contact lenses and Yellow Eyes. Respiratory Present- Difficulty Breathing. Not Present- Bloody sputum, Chronic Cough, Snoring and Wheezing. Breast Not Present- Breast Mass, Breast Pain, Nipple Discharge and Skin Changes. Cardiovascular Present- Chest Pain. Not Present- Difficulty Breathing Lying Down, Leg Cramps, Palpitations, Rapid Heart Rate, Shortness of Breath and Swelling of Extremities. Gastrointestinal Present- Abdominal Pain, Change in Bowel Habits, Indigestion, Nausea and Vomiting. Not Present- Bloating, Bloody Stool, Chronic diarrhea, Constipation, Difficulty Swallowing, Excessive gas, Gets full quickly at meals, Hemorrhoids and Rectal Pain. Male Genitourinary Not Present- Blood in Urine, Change in Urinary Stream, Frequency, Impotence, Nocturia, Painful Urination, Urgency and Urine Leakage. Musculoskeletal Not Present- Back Pain, Joint Pain, Joint Stiffness, Muscle Pain, Muscle Weakness and Swelling of Extremities. Neurological Not Present- Decreased Memory, Fainting, Headaches, Numbness, Seizures, Tingling, Tremor, Trouble walking and Weakness. Psychiatric Not Present- Anxiety, Bipolar, Change in Sleep Pattern, Depression, Fearful and Frequent crying. Endocrine Not Present- Cold Intolerance, Excessive Hunger, Hair Changes, Heat Intolerance, Hot flashes and New  Diabetes. Hematology Present- Blood Thinners. Not Present- Easy Bruising, Excessive bleeding, Gland problems, HIV and Persistent Infections.  Vitals (Joseph Orr; 08/18/2019 4:28 PM) 08/18/2019 4:27 PM Weight: 195.8 lb Height: 69in Body Surface Area: 2.05 m Body Mass Index: 28.91 kg/m  Temp.: 42F (Temporal)  Pulse: 102 (Regular)  BP: 138/64(Sitting, Left Arm, Standard)       Physical Exam Joseph Orr(Jenilyn Magana M. Ashlee Player Orr; 08/18/2019 5:55 PM) General Mental Status-Alert. General Appearance-Consistent with stated age. Hydration-Well hydrated. Voice-Normal.  Head and Neck Head-normocephalic, atraumatic with no lesions or palpable masses. Trachea-midline. Thyroid Gland Characteristics - normal size and consistency.  Eye Eyeball - Bilateral-Normal. Sclera/Conjunctiva - Bilateral-No scleral icterus.  ENMT Ears Pinna - Bilateral - no bony growth in lateral aspect of ear canal, no drainage observed. Nose and Sinuses External Inspection of the Nose - no crepitus palpated.  Chest and Lung Exam Chest and lung exam reveals -quiet, even and easy respiratory effort with no use of accessory muscles and on auscultation, normal breath sounds, no adventitious sounds and normal vocal resonance. Inspection Chest Wall - Normal. Back - normal.  Breast - Did not examine.  Cardiovascular Cardiovascular examination reveals -normal heart sounds, regular rate and rhythm with no murmurs and normal pedal pulses bilaterally.  Abdomen Inspection Inspection of the abdomen reveals - No Hernias. Skin - Scar - Note: well healed trocar scars. Palpation/Percussion Palpation and Percussion of the abdomen reveal - Soft, No Rebound tenderness, No Rigidity (guarding) and No hepatosplenomegaly. Note: Some mild tenderness to palpation in the right upper quadrant. Auscultation Auscultation of the abdomen reveals - Bowel sounds normal.  Peripheral Vascular Upper  Extremity Palpation - Pulses bilaterally normal.  Neurologic Neurologic evaluation reveals -alert and oriented x 3 with no impairment of recent or remote memory. Mental Status-Normal.  Neuropsychiatric The patient's mood and affect are described as -normal. Judgment and Insight-insight is appropriate concerning matters relevant to  self.  Musculoskeletal Normal Exam - Left-Upper Extremity Strength Normal and Lower Extremity Strength Normal. Normal Exam - Right-Upper Extremity Strength Normal and Lower Extremity Strength Normal.  Lymphatic Head & Neck  General Head & Neck Lymphatics: Bilateral - Description - Normal. Axillary - Did not examine. Femoral & Inguinal - Did not examine.    Assessment & Plan Joseph Orr M. Joia Doyle Orr; 08/18/2019 6:00 PM)  SYMPTOMATIC CHOLELITHIASIS (K80.20) Impression: I believe the patient's symptoms are consistent with gallbladder disease. However there may be some component that may be more consistent with gastritis or perhaps peptic ulcer given his daily alcohol use. But nonetheless I think he does have symptomatic cholelithiasis.  We discussed gallbladder disease. The patient was given Agricultural engineer. We discussed non-operative and operative management. We discussed the signs & symptoms of acute cholecystitis  I discussed laparoscopic cholecystectomy with IOC in detail. The patient was given educational material as well as diagrams detailing the procedure. We discussed the risks and benefits of a laparoscopic cholecystectomy including, but not limited to bleeding, infection, injury to surrounding structures such as the intestine or liver, bile leak, retained gallstones, need to convert to an open procedure, prolonged diarrhea, blood clots such as DVT, common bile duct injury, anesthesia risks, and possible need for additional procedures.   However we need to gauge his operative risks with respect to his stroke that he had back in late spring.  He needs additional evaluation by neurology so we can have a more formal discussion about potential risk of surgery  I don't think this neuro/cardio  can be worked up as an outpatient in a timely manner since he is having daily abdominal issues. So I have recommended admission to the hospital to expedite his medical workup and evaluation for potential cholecystectomy. If he is found to be at high risk then he could always be managed with a percutaneous cholecystostomy tube.  We will try to arrange direct admission to the hospitalist service from home but told them I could not guarantee this. They voice understanding. I was able to speak to the hospitalist on call who states that she felt this was  Current Plans Pt Education - Pamphlet Given - Laparoscopic Gallbladder Surgery: discussed with patient and provided information.  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 We'll hold Plavix on admission   LEFT SIDED LACUNAR INFARCTION (I63.81) Impression: Needs additional evaluation by neurology prior to undergoing cholecystectomy. Please see discussion above   BELCHING (R14.2) Impression: We did discuss that he may have more than one process going on in his foregut. He may need additional evaluation after cholecystectomy (if felt to be reasonable OR candidate) if he has ongoing upper abdominal issue  Mary Sella. Joseph Campanile, Orr, FACS General, Bariatric, & Minimally Invasive Surgery [facility], [place]

## 2019-08-19 NOTE — Telephone Encounter (Signed)
Message sent to Dr. Jordan for review and approval. 

## 2019-08-19 NOTE — Consult Note (Addendum)
NEURO HOSPITALIST CONSULT NOTE   Requesting physician: Dr. Barrie Folk   Reason for Consult: previous stroke 4/30; want neuro to see Chief complaint: abdominal pain   History obtained from:  Patient    HPI:                                                                                                                                          Joseph Orr is an 68 y.o. male  With PMH significant for CVA 02/2019 presented to [facility] for abdominal pain. Neurology asked for consult as patient had CVA 02/2019 and was not seen by neurology.     April 30th patient reports that he suddenly felt tired and had some right arm weakness, numbness and tingling. He also stated that he felt that his balance was off and he stumbled but no falls. He went to bed and woke up the next morning and noticed that his fine motor skills were  completely gone on the right side. He states that this lasted about 24 hours and then  He got better at the time he saw his PCP who recommended an MRI.  He still states that it is not all better. Sometimes he has problems grasping with the right hand, and he still feels that he  If off balance at times. Patient on plavix that was stopped today. He initially was on ASA and plavix, but ASA was stopped d/t bruising. Denies any anticoagulation, vision changes ( cataract removal right eye 1 week ago), slurrec speech, numbness or tingling.   Mri 03/15/2019  Showed: lacunar infarct left thalamus/internal capsule.   NIHSS: 0  Past Medical History:  Diagnosis Date  . Arthritis   . GERD (gastroesophageal reflux disease)   . Gout   . TIA (transient ischemic attack) 02/2019    Past Surgical History:  Procedure Laterality Date  . COLONOSCOPY     +8y  . KIDNEY DONATION    . KNEE ARTHROSCOPY      Family History  Problem Relation Age of Onset  . Arthritis Mother   . Arthritis Father   . Pneumonia Father        Died age 83  . Stroke Brother          Social  History:  reports that he quit smoking about 20 years ago. He has never used smokeless tobacco. He reports current alcohol use of about 3.0 - 4.0 standard drinks of alcohol per week. He reports that he does not use drugs.  Allergies  Allergen Reactions  . Oxycodone-Acetaminophen Nausea And Vomiting    vomiting  . Atorvastatin Nausea And Vomiting  . Rosuvastatin Nausea And Vomiting    MEDICATIONS:  Scheduled:  Continuous: . sodium chloride 75 mL/hr at 08/19/19 1251   CZY:[id number] **OR** acetaminophen, morphine injection, ondansetron **OR** ondansetron (ZOFRAN) IV, sodium chloride flush, zolpidem   ROS:                                                                                                                                       ROS was performed and is negative except as noted in HPI    Blood pressure (!) 147/78, pulse 89, temperature 99.4 F (37.4 C), temperature source Oral, resp. rate 14, height 5\' 9"  (1.753 m), weight 87.1 kg, SpO2 96 %.   General Examination:                                                                                                       Physical Exam  HEENT-  Normocephalic, no lesions, without obvious abnormality.  Normal external eye and conjunctiva.   Cardiovascular- , pulses palpable throughout   Lungs- no excessive working breathing.  Saturations within normal limits on RA Abdomen- All 4 quadrants palpated and nontender Extremities- Warm, dry and intact Musculoskeletal-no joint tenderness, deformity or swelling Skin-warm and dry, no hyperpigmentation, vitiligo, or suspicious lesions  Neurological Examination Mental Status: Alert, oriented, thought content appropriate.  Speech fluent without evidence of aphasia.  Able to follow  commands without difficulty. No dysarthria noted.  Cranial Nerves: II:  Visual fields grossly  normal,  III,IV, VI: ptosis not present, extra-ocular motions intact bilaterally pupils equal, round, reactive to light and accommodation V,VII: smile symmetric, facial light touch sensation normal bilaterally VIII: hearing normal bilaterally IX,X: uvula rises symmetrically XI: bilateral shoulder shrug XII: midline tongue extension Motor: Right : Upper extremity   5/5  Left:     Upper extremity   5/5  Lower extremity   5/5   Lower extremity   5/5 Tone and bulk:normal tone throughout; no atrophy noted Sensory: light touch intact throughout, bilaterally Deep Tendon Reflexes: 2+ and symmetric biceps, patella Plantars: Right: downgoing   Left: downgoing Cerebellar: normal finger-to-nose,and normal heel-to-shin test Gait: deferred   Lab Results: Basic Metabolic Panel: Recent Labs  Lab 08/15/19 1454 08/18/19 2322 08/19/19 0500  NA 130* 123* 123*  K 4.4 4.6 3.4*  CL 94* 87* 89*  CO2 24 23 22   GLUCOSE 137* 95 96  BUN 9 15 13   CREATININE 1.03 1.09 0.98  CALCIUM 8.8* 8.7* 8.1*    CBC: Recent Labs  Lab 08/15/19 1454 08/18/19 2322 08/19/19 0500  WBC  8.9 12.4* 12.0*  NEUTROABS  --  9.8*  --   HGB 13.9 13.2 12.9*  HCT 40.8 37.4* 36.5*  MCV 99.0 97.1 96.8  PLT 151 127* 126*    Cardiac Enzymes: No results for input(s): CKTOTAL, CKMB, CKMBINDEX, TROPONINI in the last 168 hours.  Lipid Panel: No results for input(s): CHOL, TRIG, HDL, CHOLHDL, VLDL, LDLCALC in the last 168 hours.  Imaging: Vas [place] Carotid  Result Date: 08/19/2019 Carotid Arterial Duplex Study Indications:       CVA. Comparison Study:  no prior Performing Technologist: Blanch MediaMegan Riddle RVS  Examination Guidelines: A complete evaluation includes B-mode imaging, spectral Doppler, color Doppler, and power Doppler as needed of all accessible portions of each vessel. Bilateral testing is considered an integral part of a complete examination. Limited examinations for reoccurring indications may be performed as noted.   Right Carotid Findings: +----------+--------+--------+--------+------------------+--------+           PSV cm/sEDV cm/sStenosisPlaque DescriptionComments +----------+--------+--------+--------+------------------+--------+ CCA Prox  83      16              heterogenous               +----------+--------+--------+--------+------------------+--------+ CCA Distal49      12              heterogenous               +----------+--------+--------+--------+------------------+--------+ ICA Prox  47      12      1-39%   heterogenous               +----------+--------+--------+--------+------------------+--------+ ICA Distal62      21                                         +----------+--------+--------+--------+------------------+--------+ ECA       102     9                                          +----------+--------+--------+--------+------------------+--------+ +----------+--------+-------+--------+-------------------+           PSV cm/sEDV cmsDescribeArm Pressure (mmHG) +----------+--------+-------+--------+-------------------+ Subclavian105                                        +----------+--------+-------+--------+-------------------+ +---------+--------+--+--------+--+---------+ VertebralPSV cm/s42EDV cm/s14Antegrade +---------+--------+--+--------+--+---------+  Left Carotid Findings: +----------+--------+--------+--------+------------------+--------+           PSV cm/sEDV cm/sStenosisPlaque DescriptionComments +----------+--------+--------+--------+------------------+--------+ CCA Prox  87      16              heterogenous               +----------+--------+--------+--------+------------------+--------+ CCA Distal60      15              heterogenous               +----------+--------+--------+--------+------------------+--------+ ICA Prox  47      16      1-39%   heterogenous                +----------+--------+--------+--------+------------------+--------+ ICA Distal40      14                                         +----------+--------+--------+--------+------------------+--------+  ECA       89      14                                         +----------+--------+--------+--------+------------------+--------+ +----------+--------+--------+--------+-------------------+           PSV cm/sEDV cm/sDescribeArm Pressure (mmHG) +----------+--------+--------+--------+-------------------+ [id number]                                          +----------+--------+--------+--------+-------------------+ +---------+--------+--+--------+--+---------+ VertebralPSV cm/s36EDV cm/s10Antegrade +---------+--------+--+--------+--+---------+     Preliminary       Assessment:  68 y.o. male  With PMH significant for CVA 02/2019 presented to [facility] for abdominal pain due to cholecystitis.  Neurology was consulted by medicine service for clearance for surgery.  It appears patient had right upper extremity weakness and tingling that began on 03/03/19.  MRI brain was performed on 5/12 which showed a subacute infarct in the left thalamus.  Patient was started on aspirin Plavix and now continues on Plavix alone.  He is well recovered almost completely from his stroke apart from some difficulty performing complex motor tasks according to the patient.  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   I have reviewed the contents of history and physical exam as documented by PA/ARNP/Resident and agree with above documentation.  I have discussed and formulated the above plan as documented. Edits to the note have been made as needed.  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 Neurology consulted for clearance. Will complete vascular studies for completion of workup, however no contraindication for neurology for surgery.  CTA was performed, no significant carotid disease.  While there will be a slight increase in stroke risk with discontinuation of Plavix and blood pressure fluctuations this should not preclude him from surgery.  Avoid hypotension or significant hypertension.  Please continue  plavix ( or ASA) as soon as possible post op.     Karena Addison Jamee Keach MD [facility] [phone]   If 7pm to 7am, please call on call as listed on AMION.

## 2019-08-19 NOTE — Progress Notes (Signed)
Patient ID: Joseph Orr, male   DOB: [birth year], 68 y.o.   MRN: [id number]  T. Bili up to 3.1.  While awaiting Neurology evaluation, we have ordered an MRCP.  If positive, will need GI consult.  Imogene Burn. Georgette Dover, MD, [facility]  General/ Trauma Surgery   08/19/2019 1:26 PM

## 2019-08-19 NOTE — Telephone Encounter (Signed)
Left message to return call to office to schedule appointment. 

## 2019-08-19 NOTE — Progress Notes (Signed)
*  PRELIMINARY RESULTS* Echocardiogram 2D Echocardiogram has been performed.  Leavy Cella 08/19/2019, 3:28 PM

## 2019-08-20 DIAGNOSIS — K8309 Other cholangitis: Secondary | ICD-10-CM

## 2019-08-20 DIAGNOSIS — K8042 Calculus of bile duct with acute cholecystitis without obstruction: Secondary | ICD-10-CM

## 2019-08-20 LAB — SARS CORONAVIRUS 2 BY RT PCR (HOSPITAL ORDER, PERFORMED IN ~~LOC~~ HOSPITAL LAB): SARS Coronavirus 2: NEGATIVE

## 2019-08-20 LAB — CBC WITH DIFFERENTIAL/PLATELET
Abs Immature Granulocytes: 0.1 10*3/uL — ABNORMAL HIGH (ref 0.00–0.07)
Basophils Absolute: 0 10*3/uL (ref 0.0–0.1)
Basophils Relative: 0 %
Eosinophils Absolute: 0 10*3/uL (ref 0.0–0.5)
Eosinophils Relative: 0 %
HCT: 32.6 % — ABNORMAL LOW (ref 39.0–52.0)
Hemoglobin: 11.1 g/dL — ABNORMAL LOW (ref 13.0–17.0)
Immature Granulocytes: 1 %
Lymphocytes Relative: 6 %
Lymphs Abs: 0.5 10*3/uL — ABNORMAL LOW (ref 0.7–4.0)
MCH: 33.6 pg (ref 26.0–34.0)
MCHC: 34 g/dL (ref 30.0–36.0)
MCV: 98.8 fL (ref 80.0–100.0)
Monocytes Absolute: 1.2 10*3/uL — ABNORMAL HIGH (ref 0.1–1.0)
Monocytes Relative: 14 %
Neutro Abs: 6.4 10*3/uL (ref 1.7–7.7)
Neutrophils Relative %: 79 %
Platelets: UNDETERMINED 10*3/uL (ref 150–400)
RBC: 3.3 MIL/uL — ABNORMAL LOW (ref 4.22–5.81)
RDW: 12 % (ref 11.5–15.5)
WBC: 8.1 10*3/uL (ref 4.0–10.5)
nRBC: 0 % (ref 0.0–0.2)

## 2019-08-20 LAB — URINALYSIS, ROUTINE W REFLEX MICROSCOPIC
Glucose, UA: NEGATIVE mg/dL
Ketones, ur: NEGATIVE mg/dL
Leukocytes,Ua: NEGATIVE
Nitrite: POSITIVE — AB
Protein, ur: 100 mg/dL — AB
Specific Gravity, Urine: 1.026 (ref 1.005–1.030)
pH: 5 (ref 5.0–8.0)

## 2019-08-20 LAB — COMPREHENSIVE METABOLIC PANEL
ALT: 160 U/L — ABNORMAL HIGH (ref 0–44)
AST: 408 U/L — ABNORMAL HIGH (ref 15–41)
Albumin: 2.5 g/dL — ABNORMAL LOW (ref 3.5–5.0)
Alkaline Phosphatase: 217 U/L — ABNORMAL HIGH (ref 38–126)
Anion gap: 17 — ABNORMAL HIGH (ref 5–15)
BUN: 13 mg/dL (ref 8–23)
CO2: 17 mmol/L — ABNORMAL LOW (ref 22–32)
Calcium: 8 mg/dL — ABNORMAL LOW (ref 8.9–10.3)
Chloride: 97 mmol/L — ABNORMAL LOW (ref 98–111)
Creatinine, Ser: 1.46 mg/dL — ABNORMAL HIGH (ref 0.61–1.24)
GFR calc Af Amer: 56 mL/min — ABNORMAL LOW (ref >60)
GFR calc non Af Amer: 49 mL/min — ABNORMAL LOW (ref >60)
Glucose, Bld: 74 mg/dL (ref 70–99)
Potassium: 3.7 mmol/L (ref 3.5–5.1)
Sodium: 131 mmol/L — ABNORMAL LOW (ref 135–145)
Total Bilirubin: 6.5 mg/dL — ABNORMAL HIGH (ref 0.3–1.2)
Total Protein: 5.3 g/dL — ABNORMAL LOW (ref 6.5–8.1)

## 2019-08-20 LAB — CBC
HCT: 35.7 % — ABNORMAL LOW (ref 39.0–52.0)
Hemoglobin: 13 g/dL (ref 13.0–17.0)
MCH: 34.9 pg — ABNORMAL HIGH (ref 26.0–34.0)
MCHC: 36.4 g/dL — ABNORMAL HIGH (ref 30.0–36.0)
MCV: 96 fL (ref 80.0–100.0)
Platelets: 139 10*3/uL — ABNORMAL LOW (ref 150–400)
RBC: 3.72 MIL/uL — ABNORMAL LOW (ref 4.22–5.81)
RDW: 11.8 % (ref 11.5–15.5)
WBC: 7.6 10*3/uL (ref 4.0–10.5)
nRBC: 0 % (ref 0.0–0.2)

## 2019-08-20 LAB — LIPASE, BLOOD: Lipase: 18 U/L (ref 11–51)

## 2019-08-20 LAB — AMYLASE: Amylase: 19 U/L — ABNORMAL LOW (ref 28–100)

## 2019-08-20 LAB — PROTIME-INR
INR: 1.5 — ABNORMAL HIGH (ref 0.8–1.2)
Prothrombin Time: 17.6 seconds — ABNORMAL HIGH (ref 11.4–15.2)

## 2019-08-20 LAB — MAGNESIUM: Magnesium: 1.5 mg/dL — ABNORMAL LOW (ref 1.7–2.4)

## 2019-08-20 MED ORDER — LORAZEPAM 2 MG/ML IJ SOLN: 1.0000 mg | Freq: Once | INTRAMUSCULAR | Status: DC

## 2019-08-20 MED ORDER — PIPERACILLIN-TAZOBACTAM 3.375 G IVPB
3.3750 g | Freq: Three times a day (TID) | INTRAVENOUS | Status: DC
  Administered 2019-08-20 – 2019-08-21 (×3): 3.375 g via INTRAVENOUS
  Filled 2019-08-20 (×2): qty 50

## 2019-08-20 NOTE — Progress Notes (Signed)
Contact isolation and droplet isolation discontinued, Coronavirus result negative

## 2019-08-20 NOTE — Consult Note (Signed)
° °Referring Provider:  N. Ugah, MD   °Primary Care Physician:  Jordan, Betty G, MD °Primary Gastroenterologist: [facility]      °Reason for Consultation: elevated LFTs, CBD stones, cholangitis                ° ° °ASSESSMENT /  PLAN   °68-year-old male with a history of TIA on Plavix, GERD, hyperlipidemia, gout, alcohol use admitted with symptomatic cholelithiasis having now developed cholecystitis with known choledocholithiasis and cholangitis ° °1.  Cholecystitis/cholangitis --he has developed fever concerning for cholangitis.  AST, ALT, bilirubin and alkaline phosphatase are markedly elevated in the last 24 hours.  Known common bile duct stone by MRCP. °--Sepsis protocol; biliary source; I have ordered blood cultures x2 °--Zosyn started per pharmacy consult °--Plan ERCP tomorrow morning at 7:30 AM with Dr. Hung; Plavix will have been stopped 4 days and so sphincterotomy may not be performed.  May need to place a temporizing common bile duct stent, with stone extraction at a later date when Plavix can be held at least 5 days. °--I discussed the risk, benefits and alternatives to ERCP with the patient.  We discussed the risk of bleeding, infection, anesthesia related complications, pancreatitis, death.  He voiced understanding and wishes to proceed. °--Surgery following and will plan cholecystectomy after ERCP °--He has not had a COVID-19 test during this admission.  This is necessary before ERCP tomorrow.  I have ordered COVID-19 testing and requested the rapid test.  He did have a negative COVID-19 test on the 12th but was not admitted until the 15th. °--Continue to hold Plavix °--Hold pharmacologic DVT prophylaxis until after ERCP; continue SCDs ° °I discussed all of the above including the plan with the patient's wife by phone today.  She will communicate our conversation to their daughter, Megan. ° ° ° °HPI:   ° ° °Joseph Orr is a 68 y.o. male with a past medical history of GERD, TIA on Plavix,  hyperlipidemia, gout, alcohol use and recent upper abdominal pain with nausea and vomiting found to have gallstones who was admitted for right upper quadrant pain. ° °He was seen in our office by Jessica Zehr, PA-C on 08/17/2019 to evaluate epigastric pain, nausea or vomiting.  It was felt that he was having symptomatic cholelithiasis and was sent for surgical consultation.  He saw Dr. Wilson with [facility] who recommended he be admitted for expedited neurological evaluation due to history of recent stroke and to evaluate risk for cholecystectomy. ° °In the last 2 days he has been noted to have rising liver enzymes in association with his upper abdominal pain.  His bilirubin has risen from 1.9 on 08/15/2019 20-6.5 today.  He is also had an acute rise in his AST and ALT in the last 24 hours with AST at 408 and ALT 160 today.  Alkaline phosphatase is elevated today at 217. ° °This morning he developed fever to 103 associated with tachycardia.  Rapid response was called. ° °MRCP was performed which showed cholelithiasis along with suspicion of acute cholecystitis.  The common bile duct is dilated to 10 mm with an 8 mm distal common duct stone seen at the ampulla. ° °Of note he is on chronic Plavix and his last Plavix dose was on 08/17/2019. ° °He had a COVID-19 PCR test negative on 08/15/2019 when he went to the ER but I do not see that this has been repeated this admission. ° °Currently, at the time of my examination he reports his upper   abdominal pain seems to come and go but right now is absent.  He feels very tired and weak. ° ° °Past Medical History:  °Diagnosis Date  °• Arthritis   °• CVA (cerebral vascular accident) (HCC) 03/03/2019  °• GERD (gastroesophageal reflux disease)   °• Gout   °• TIA (transient ischemic attack) 02/2019  ° ° °Past Surgical History:  °Procedure Laterality Date  °• COLONOSCOPY    ° +8y  °• KIDNEY DONATION    °• KNEE ARTHROSCOPY    ° ° °Prior to Admission medications   °Medication Sig Start Date  End Date Taking? Authorizing Provider  °acetaminophen (TYLENOL) 325 MG tablet Take 650 mg by mouth every 6 (six) hours as needed.    [provider]  °albuterol (PROVENTIL HFA;VENTOLIN HFA) 108 (90 Base) MCG/ACT inhaler Inhale 2 puffs into the lungs every 4 (four) hours as needed for wheezing or shortness of breath. 04/06/18   Jordan, Betty G, MD  °allopurinol (ZYLOPRIM) 100 MG tablet TAKE 1 TABLET BY MOUTH DAILY °Patient taking differently: Take 100 mg by mouth daily.  04/19/19   Jordan, Betty G, MD  °cetirizine (ZYRTEC) 10 MG tablet Take 10 mg by mouth daily.    [provider]  °clopidogrel (PLAVIX) 75 MG tablet TAKE 1 TABLET BY MOUTH DAILY °Patient taking differently: Take 75 mg by mouth daily.  06/15/19   Jordan, Betty G, MD  °ondansetron (ZOFRAN) 4 MG tablet Take 1 tablet (4 mg total) by mouth every 6 (six) hours. 08/15/19   Butler, Michael C, MD  °pantoprazole (PROTONIX) 20 MG tablet Take 1 tablet (20 mg total) by mouth daily. 06/21/19   Jordan, Betty G, MD  °prednisoLONE acetate (PRED FORTE) 1 % ophthalmic suspension Place 1 drop into the right eye daily. 06/29/19   [provider]  ° ° °Current Facility-Administered Medications  °Medication Dose Route Frequency Provider Last Rate Last Dose  °• 0.9 %  sodium chloride infusion   Intravenous Continuous Ugah, Nwannadiya, MD 125 mL/hr at 08/20/19 1208    °• acetaminophen (TYLENOL) tablet 650 mg  650 mg Oral Q6H PRN Patel, Vishal R, MD   650 mg at 08/20/19 1043  ° Or  °• acetaminophen (TYLENOL) suppository 650 mg  650 mg Rectal Q6H PRN Patel, Vishal R, MD      °• morphine 2 MG/ML injection 1 mg  1 mg Intravenous Q3H PRN Patel, Vishal R, MD   1 mg at 08/20/19 0945  °• ondansetron (ZOFRAN) tablet 4 mg  4 mg Oral Q6H PRN Patel, Vishal R, MD      ° Or  °• ondansetron (ZOFRAN) injection 4 mg  4 mg Intravenous Q6H PRN Patel, Vishal R, MD   4 mg at 08/20/19 0536  °• piperacillin-tazobactam (ZOSYN) IVPB 3.375 g  3.375 g Intravenous Q8H [place],  Yujing Z, RPH      °• sodium chloride flush (NS) 0.9 % injection 10-40 mL  10-40 mL Intracatheter PRN Hall, Carole N, DO      °• zolpidem (AMBIEN) tablet 5 mg  5 mg Oral QHS PRN Patel, Vishal R, MD   5 mg at 08/19/19 2123  ° ° °Allergies as of 08/18/2019 - Review Complete 08/17/2019  °Allergen Reaction Noted  °• Oxycodone-acetaminophen Nausea And Vomiting 03/11/2017  °• Atorvastatin Nausea And Vomiting 08/15/2019  °• Rosuvastatin Nausea And Vomiting 08/15/2019  ° ° °Family History  °Problem Relation Age of Onset  °• Arthritis Mother   °• Arthritis Father   °• Pneumonia Father   °       Died age 85  °• Stroke Brother   ° ° °Social History  ° °Socioeconomic History  °• Marital status: Married  °  Spouse name: Not on file  °• Number of children: Not on file  °• Years of education: Not on file  °• Highest education level: Not on file  °Occupational History  °• Occupation: [employer]  °Social Needs  °• Financial resource strain: Not on file  °• Food insecurity  °  Worry: Not on file  °  Inability: Not on file  °• Transportation needs  °  Medical: Not on file  °  Non-medical: Not on file  °Tobacco Use  °• Smoking status: Former Smoker  °  Quit date: 11/03/1998  °  Years since quitting: 20.8  °• Smokeless tobacco: Never Used  °Substance and Sexual Activity  °• Alcohol use: Yes  °  Alcohol/week: 3.0 - 4.0 standard drinks  °  Types: 3 - 4 Cans of beer per week  °  Comment: daily  °• Drug use: Never  °• Sexual activity: Not on file  °Social History Narrative  ° Moved here to be with her daughter who works at [employer]  ° ° °Review of Systems: °All systems reviewed and negative except where noted in HPI. ° °Physical Exam: °Vital signs in last 24 hours: °Temp:  [98.2 °F (36.8 °C)-103.1 °F (39.5 °C)] 98.7 °F (37.1 °C) (10/17 1210) °Pulse Rate:  [82-143] 109 (10/17 1210) °Resp:  [18-20] 18 (10/17 1210) °BP: (108-167)/(64-85) 108/64 (10/17 1210) °SpO2:  [95 %-99 %] 97 % (10/17 1210) °Last BM Date: 08/19/19 °General:   Awake, alert, NAD with mild  diaphoresis °Psych:  Pleasant, cooperative. Normal mood and affect. °Eyes:  Pupils equal, sclera clear, sclera anicteric °Neck:  Supple; no masses °Lungs:  Clear throughout to auscultation.   No wheezes, crackles, or rhonchi.  °Heart:  Regular rate and rhythm; no murmurs, no lower extremity edema °Abdomen:  Soft, non-distended, moderate to significant tenderness in the epigastrium right upper quadrant without rebound, BS active, no palp mass   °Rectal:  Deferred  °Msk:  Symmetrical without gross deformities.  SCDs in place bilateral lower extremities °Neurologic:  Alert and  oriented x4;  grossly normal neurologically. °Skin:  Intact without significant lesions or rashes. ° ° °Intake/Output from previous day: °10/16 0701 - 10/17 0700 °In: 662.5 [I.V.:662.5] °Out: -  °Intake/Output this shift: °No intake/output data recorded. ° °Lab Results: °Recent Labs  °  08/18/19 °2322 08/19/19 °0500 08/20/19 °1047  °WBC 12.4* 12.0* 7.6  °HGB 13.2 12.9* 13.0  °HCT 37.4* 36.5* 35.7*  °PLT 127* 126* 139*  ° °BMET °Recent Labs  °  08/18/19 °2322 08/19/19 °0500 08/20/19 °1047  °NA 123* 123* 131*  °K 4.6 3.4* 3.7  °CL 87* 89* 97*  °CO2 23 22 17*  °GLUCOSE 95 96 74  °BUN 15 13 13  °CREATININE 1.09 0.98 1.46*  °CALCIUM 8.7* 8.1* 8.0*  ° °LFT °Recent Labs  °  08/18/19 °2322  08/20/19 °1047  °PROT 6.1*   < > 5.3*  °ALBUMIN 3.0*   < > 2.5*  °AST 37   < > 408*  °ALT 31   < > 160*  °ALKPHOS 68   < > 217*  °BILITOT 3.2*   < > 6.5*  °BILIDIR 1.6*  --   --   °IBILI 1.6*  --   --   ° < > = values in this interval not displayed.  ° °PT/INR °No results for input(s): LABPROT, INR in   the last 72 hours. °Hepatitis Panel °No results for input(s): HEPBSAG, HCVAB, HEPAIGM, HEPBIGM in the last 72 hours. ° ° °. °CBC Latest Ref Rng & Units 08/20/2019 08/19/2019 08/18/2019  °WBC 4.0 - 10.5 K/uL 7.6 12.0(H) 12.4(H)  °Hemoglobin 13.0 - 17.0 g/dL 13.0 12.9(L) 13.2  °Hematocrit 39.0 - 52.0 % 35.7(L) 36.5(L) 37.4(L)  °Platelets 150 - 400 K/uL 139(L) 126(L)  127(L)  ° ° °. °CMP Latest Ref Rng & Units 08/20/2019 08/19/2019 08/18/2019  °Glucose 70 - 99 mg/dL 74 96 95  °BUN 8 - 23 mg/dL 13 13 15  °Creatinine 0.61 - 1.24 mg/dL 1.46(H) 0.98 1.09  °Sodium 135 - 145 mmol/L 131(L) 123(L) 123(L)  °Potassium 3.5 - 5.1 mmol/L 3.7 3.4(L) 4.6  °Chloride 98 - 111 mmol/L 97(L) 89(L) 87(L)  °CO2 22 - 32 mmol/L 17(L) 22 23  °Calcium 8.9 - 10.3 mg/dL 8.0(L) 8.1(L) 8.7(L)  °Total Protein 6.5 - 8.1 g/dL 5.3(L) 5.7(L) 6.1(L)  °Total Bilirubin 0.3 - 1.2 mg/dL 6.5(H) 3.1(H) 3.2(H)  °Alkaline Phos 38 - 126 U/L 217(H) 63 68  °AST 15 - 41 U/L 408(H) 32 37  °ALT 0 - 44 U/L 160(H) 27 31  ° °Studies/Results: °Ct Angio Head W Or Wo Contrast ° °Result Date: 08/19/2019 °CLINICAL DATA:  Focal neurological deficit EXAM: CT ANGIOGRAPHY HEAD AND NECK TECHNIQUE: Multidetector CT imaging of the head and neck was performed using the standard protocol during bolus administration of intravenous contrast. Multiplanar CT image reconstructions and MIPs were obtained to evaluate the vascular anatomy. Carotid stenosis measurements (when applicable) are obtained utilizing NASCET criteria, using the distal internal carotid diameter as the denominator. CONTRAST:  100mL OMNIPAQUE IOHEXOL 350 MG/ML SOLN COMPARISON:  MRI 03/15/2019 FINDINGS: CT HEAD FINDINGS Brain: Generalized atrophy. No focal abnormality seen affecting the brainstem or cerebellum. Cerebral hemispheres show an old infarction of the left thalamus no other focal finding. No mass lesion, hemorrhage, hydrocephalus or extra-axial collection. Vascular: There is atherosclerotic calcification of the major vessels at the base of the brain. Skull: Negative Sinuses: Left maxillary sinus inflammatory changes. Other sinuses clear. Orbits: Negative Review of the MIP images confirms the above findings CTA NECK FINDINGS Aortic arch: Aortic atherosclerosis. No aneurysm or dissection. Branching pattern is normal without origin stenosis. Right carotid system: Common  carotid artery is widely patent to the bifurcation region. There is calcified plaque at the carotid bifurcation and ICA bulb. Minimal diameter is 4.5 mm, the same as the more distal cervical ICA, therefore no stenosis. Left carotid system: Common carotid artery widely patent to the bifurcation region. Calcified plaque at the carotid bifurcation and ICA bulb. Minimal diameter in the ICA bulb measures 2.7 mm. Compared to a more distal cervical ICA diameter 4.4 mm, this indicates a 40% stenosis. Vertebral arteries: Calcified plaque at both vertebral artery origins. No stenosis on the right. 30% stenosis on the left. Beyond that, both vertebral arteries are widely patent through the cervical region to foramen magnum. Skeleton: Mild cervical spondylosis. Other neck: No mass or lymphadenopathy. Upper chest: Negative Review of the MIP images confirms the above findings CTA HEAD FINDINGS Anterior circulation: Both internal carotid arteries are patent through the skull base and siphon regions. There is atherosclerotic calcification throughout the carotid siphons with stenosis estimated at 50% on each side. The anterior and middle cerebral vessels are patent without proximal stenosis, aneurysm or vascular malformation. No large or medium vessel occlusion identified. Posterior circulation: Both vertebral arteries are patent through the foramen magnum to the basilar. Atherosclerotic irregularity of   the V4 segments but no stenosis greater than 30%. No basilar stenosis. Superior cerebellar and posterior cerebral arteries show flow on both sides. Venous sinuses: Patent and normal. Anatomic variants: None significant. Review of the MIP images confirms the above findings IMPRESSION: 1. Aortic atherosclerosis. No aneurysm or dissection. 2. Atherosclerotic disease at both carotid bifurcations but without measurable stenosis on the right. 40% stenosis of the ICA bulb on the left. 3. Atherosclerotic disease throughout the carotid  siphons with stenosis estimated at 50% on each side. 4. No intracranial large or medium vessel occlusion or correctable proximal stenosis. 5. 30% stenosis of the left vertebral artery origin. Aortic Atherosclerosis (ICD10-I70.0). Electronically Signed   By: Mark  Shogry M.D.   On: 08/19/2019 14:42  ° °Ct Angio Neck W Or Wo Contrast ° °Result Date: 08/19/2019 °CLINICAL DATA:  Focal neurological deficit EXAM: CT ANGIOGRAPHY HEAD AND NECK TECHNIQUE: Multidetector CT imaging of the head and neck was performed using the standard protocol during bolus administration of intravenous contrast. Multiplanar CT image reconstructions and MIPs were obtained to evaluate the vascular anatomy. Carotid stenosis measurements (when applicable) are obtained utilizing NASCET criteria, using the distal internal carotid diameter as the denominator. CONTRAST:  100mL OMNIPAQUE IOHEXOL 350 MG/ML SOLN COMPARISON:  MRI 03/15/2019 FINDINGS: CT HEAD FINDINGS Brain: Generalized atrophy. No focal abnormality seen affecting the brainstem or cerebellum. Cerebral hemispheres show an old infarction of the left thalamus no other focal finding. No mass lesion, hemorrhage, hydrocephalus or extra-axial collection. Vascular: There is atherosclerotic calcification of the major vessels at the base of the brain. Skull: Negative Sinuses: Left maxillary sinus inflammatory changes. Other sinuses clear. Orbits: Negative Review of the MIP images confirms the above findings CTA NECK FINDINGS Aortic arch: Aortic atherosclerosis. No aneurysm or dissection. Branching pattern is normal without origin stenosis. Right carotid system: Common carotid artery is widely patent to the bifurcation region. There is calcified plaque at the carotid bifurcation and ICA bulb. Minimal diameter is 4.5 mm, the same as the more distal cervical ICA, therefore no stenosis. Left carotid system: Common carotid artery widely patent to the bifurcation region. Calcified plaque at the carotid  bifurcation and ICA bulb. Minimal diameter in the ICA bulb measures 2.7 mm. Compared to a more distal cervical ICA diameter 4.4 mm, this indicates a 40% stenosis. Vertebral arteries: Calcified plaque at both vertebral artery origins. No stenosis on the right. 30% stenosis on the left. Beyond that, both vertebral arteries are widely patent through the cervical region to foramen magnum. Skeleton: Mild cervical spondylosis. Other neck: No mass or lymphadenopathy. Upper chest: Negative Review of the MIP images confirms the above findings CTA HEAD FINDINGS Anterior circulation: Both internal carotid arteries are patent through the skull base and siphon regions. There is atherosclerotic calcification throughout the carotid siphons with stenosis estimated at 50% on each side. The anterior and middle cerebral vessels are patent without proximal stenosis, aneurysm or vascular malformation. No large or medium vessel occlusion identified. Posterior circulation: Both vertebral arteries are patent through the foramen magnum to the basilar. Atherosclerotic irregularity of the V4 segments but no stenosis greater than 30%. No basilar stenosis. Superior cerebellar and posterior cerebral arteries show flow on both sides. Venous sinuses: Patent and normal. Anatomic variants: None significant. Review of the MIP images confirms the above findings IMPRESSION: 1. Aortic atherosclerosis. No aneurysm or dissection. 2. Atherosclerotic disease at both carotid bifurcations but without measurable stenosis on the right. 40% stenosis of the ICA bulb on the left. 3. Atherosclerotic disease throughout   the carotid siphons with stenosis estimated at 50% on each side. 4. No intracranial large or medium vessel occlusion or correctable proximal stenosis. 5. 30% stenosis of the left vertebral artery origin. Aortic Atherosclerosis (ICD10-I70.0). Electronically Signed   By: Mark  Shogry M.D.   On: 08/19/2019 14:42  ° °Mr Abdomen Mrcp W Wo Contast ° °Result  Date: 08/20/2019 °CLINICAL DATA:  Upper abdominal pain, abnormal LFTs EXAM: MRI ABDOMEN WITHOUT AND WITH CONTRAST (INCLUDING MRCP) TECHNIQUE: Multiplanar multisequence MR imaging of the abdomen was performed both before and after the administration of intravenous contrast. Heavily T2-weighted images of the biliary and pancreatic ducts were obtained, and three-dimensional MRCP images were rendered by post processing. CONTRAST:  8mL GADAVIST GADOBUTROL 1 MMOL/ML IV SOLN COMPARISON:  CTA abdomen/pelvis dated 08/15/2019. Right upper quadrant ultrasound dated 08/17/2019. FINDINGS: Lower chest: Lung bases are clear. Hepatobiliary: No morphologic findings of cirrhosis. Geographic altered perfusion along the gallbladder fossa. No suspicious/enhancing hepatic lesions. Hepatic steatosis. Cholelithiasis gallbladder wall thickening and pericholecystic inflammatory changes. No intrahepatic ductal dilatation. Common duct measures 7 mm proximally and 10 mm distally. Associated 8 mm distal CBD stone at the ampulla (series 14/image 9). Pancreas:  Within normal limits. Spleen:  Within normal limits. Adrenals/Urinary Tract:  Adrenal glands are within normal limits. Right kidney is within normal limits. Status post left nephrectomy. Stomach/Bowel: Stomach is within normal limits. Visualized bowel is notable for scattered colonic diverticulosis. Vascular/Lymphatic:  No evidence of abdominal aortic aneurysm. No suspicious abdominal lymphadenopathy. Other:  No abdominal ascites. Musculoskeletal: No focal osseous lesions. IMPRESSION: Cholelithiasis with findings suspicious for acute cholecystitis. Dilated common duct, measuring up to 10 mm distally, with associated 8 mm distal CBD stone at the ampulla. These results will be called to the ordering clinician or representative by the Radiologist Assistant, and communication documented in the PACS or zVision Dashboard. Electronically Signed   By: Sriyesh  Krishnan M.D.   On: 08/20/2019 09:01    ° ° °Active Problems: °  Hyperlipidemia °  GERD (gastroesophageal reflux disease) °  RUQ abdominal pain °  History of CVA (cerebrovascular accident) ° ° ° ° °Euva Rundell M. Mazi Brailsford, M.D. @  08/20/2019, 12:26 PM ° ° ° ° °

## 2019-08-20 NOTE — Progress Notes (Signed)
Pharmacy Antibiotic Note  Joseph Orr is a 68 y.o. male admitted on 08/18/2019 with choledochlithiasis and likely cholecystitis. +MRCP, will need ERCP with GI consult. Pharmacy has been consulted for Zosyn dosing.  SCr increased from 0.98 to 1.46 today, baseline ~1.0. Afebrile, WBC trending down to 7.6 today.   Plan: Zosyn 3.375 gm IV q8h 4-hour infusion Monitor cxs, renal fxn, and clinical improvement   Height: 5\' 9"  (175.3 cm) Weight: 192 lb (87.1 kg) IBW/kg (Calculated) : 70.7  Temp (24hrs), Avg:99.7 F (37.6 C), Min:98.2 F (36.8 C), Max:103.1 F (39.5 C)  Recent Labs  Lab 08/15/19 1454 08/18/19 2322 08/19/19 0500 08/20/19 1047  WBC 8.9 12.4* 12.0* 7.6  CREATININE 1.03 1.09 0.98 1.46*    Estimated Creatinine Clearance: 52.9 mL/min (A) (by C-G formula based on SCr of 1.46 mg/dL (H)).    Allergies  Allergen Reactions  . Oxycodone-Acetaminophen Nausea And Vomiting    vomiting  . Atorvastatin Nausea And Vomiting  . Rosuvastatin Nausea And Vomiting    Antimicrobials this admission: Zosyn 10/17 >>  Microbiology results: None  Thank you for allowing pharmacy to be a part of this patient's care.  Berenice Bouton, PharmD PGY1 Pharmacy Resident Office phone: [phone] Phone until 3:30 pm: [phone] 08/20/2019 12:18 PM

## 2019-08-20 NOTE — Progress Notes (Signed)
[facility]/Trauma Progress Note      Assessment/Plan GOUT GERD CVA 02/2019 - per neurology, okay for surgery and to hold plavix, resume plavix as soon as possible postop  Choledocholithiasis and likely cholecystitis - MRCP showed CBD stone and findings suspicious for cholecystitis - recommend GI consult for ERCP - surgical planning post ERCP - we will follow  FEN: NPO VTE: SCD's, per medicine but okay for lovenox form our standpoint ID: Rocephin 10/17>> Foley: none Follow up: TBD    LOS: 2 days    Subjective: CC: abdominal pain  Patient is still having significant abdominal pain.  No other issues overnight.  I discussed results of his MRCP.  Objective: Vital signs in last 24 hours: Temp:  [98.9 F (37.2 C)-99.9 F (37.7 C)] 99.9 F (37.7 C) (10/17 0428) Pulse Rate:  [82-115] 98 (10/17 0510) Resp:  [18-20] 18 (10/17 0428) BP: (122-167)/(78-85) 122/79 (10/17 0428) SpO2:  [95 %-98 %] 95 % (10/17 0428) Last BM Date: 08/19/19  Intake/Output from previous day: 10/16 0701 - 10/17 0700 In: 662.5 [I.V.:662.5] Out: -  Intake/Output this shift: No intake/output data recorded.  PE: Gen:  Alert, NAD, pleasant, cooperative Pulm: Rate and effort normal Abd: Soft, ND, no HSM, TTP of RUQ and epigastrium with guarding.  No peritonitis Skin: no rashes noted, warm and dry   Anti-infectives: Anti-infectives (From admission, onward)   None      Lab Results:  Recent Labs    08/18/19 2322 08/19/19 0500  WBC 12.4* 12.0*  HGB 13.2 12.9*  HCT 37.4* 36.5*  PLT 127* 126*   BMET Recent Labs    08/18/19 2322 08/19/19 0500  NA 123* 123*  K 4.6 3.4*  CL 87* 89*  CO2 23 22  GLUCOSE 95 96  BUN 15 13  CREATININE 1.09 0.98  CALCIUM 8.7* 8.1*   PT/INR No results for input(s): LABPROT, INR in the last 72 hours. CMP     Component Value Date/Time   NA 123 (L) 08/19/2019 0500   K 3.4 (L) 08/19/2019 0500   CL 89 (L) 08/19/2019 0500   CO2 22 08/19/2019  0500   GLUCOSE 96 08/19/2019 0500   BUN 13 08/19/2019 0500   CREATININE 0.98 08/19/2019 0500   CALCIUM 8.1 (L) 08/19/2019 0500   PROT 5.7 (L) 08/19/2019 0500   ALBUMIN 2.8 (L) 08/19/2019 0500   AST 32 08/19/2019 0500   ALT 27 08/19/2019 0500   ALKPHOS 63 08/19/2019 0500   BILITOT 3.1 (H) 08/19/2019 0500   GFRNONAA >60 08/19/2019 0500   GFRAA >60 08/19/2019 0500   Lipase     Component Value Date/Time   LIPASE 32 08/15/2019 1454    Studies/Results: Ct Angio Head W Or Wo Contrast  Result Date: 08/19/2019 CLINICAL DATA:  Focal neurological deficit EXAM: CT ANGIOGRAPHY HEAD AND NECK TECHNIQUE: Multidetector CT imaging of the head and neck was performed using the standard protocol during bolus administration of intravenous contrast. Multiplanar CT image reconstructions and MIPs were obtained to evaluate the vascular anatomy. Carotid stenosis measurements (when applicable) are obtained utilizing NASCET criteria, using the distal internal carotid diameter as the denominator. CONTRAST:  OMNIPAQUE IOHEXOL 350 MG/ML SOLN COMPARISON:  MRI 03/15/2019 FINDINGS: CT HEAD FINDINGS Brain: Generalized atrophy. No focal abnormality seen affecting the brainstem or cerebellum. Cerebral hemispheres show an old infarction of the left thalamus no other focal finding. No mass lesion, hemorrhage, hydrocephalus or extra-axial collection. Vascular: There is atherosclerotic calcification of the major vessels at the base  of the brain. Skull: Negative Sinuses: Left maxillary sinus inflammatory changes. Other sinuses clear. Orbits: Negative Review of the MIP images confirms the above findings CTA NECK FINDINGS Aortic arch: Aortic atherosclerosis. No aneurysm or dissection. Branching pattern is normal without origin stenosis. Right carotid system: Common carotid artery is widely patent to the bifurcation region. There is calcified plaque at the carotid bifurcation and ICA bulb. Minimal diameter is 4.5 mm, the same as  the more distal cervical ICA, therefore no stenosis. Left carotid system: Common carotid artery widely patent to the bifurcation region. Calcified plaque at the carotid bifurcation and ICA bulb. Minimal diameter in the ICA bulb measures 2.7 mm. Compared to a more distal cervical ICA diameter 4.4 mm, this indicates a 40% stenosis. Vertebral arteries: Calcified plaque at both vertebral artery origins. No stenosis on the right. 30% stenosis on the left. Beyond that, both vertebral arteries are widely patent through the cervical region to foramen magnum. Skeleton: Mild cervical spondylosis. Other neck: No mass or lymphadenopathy. Upper chest: Negative Review of the MIP images confirms the above findings CTA HEAD FINDINGS Anterior circulation: Both internal carotid arteries are patent through the skull base and siphon regions. There is atherosclerotic calcification throughout the carotid siphons with stenosis estimated at 50% on each side. The anterior and middle cerebral vessels are patent without proximal stenosis, aneurysm or vascular malformation. No large or medium vessel occlusion identified. Posterior circulation: Both vertebral arteries are patent through the foramen magnum to the basilar. Atherosclerotic irregularity of the V4 segments but no stenosis greater than 30%. No basilar stenosis. Superior cerebellar and posterior cerebral arteries show flow on both sides. Venous sinuses: Patent and normal. Anatomic variants: None significant. Review of the MIP images confirms the above findings IMPRESSION: 1. Aortic atherosclerosis. No aneurysm or dissection. 2. Atherosclerotic disease at both carotid bifurcations but without measurable stenosis on the right. 40% stenosis of the ICA bulb on the left. 3. Atherosclerotic disease throughout the carotid siphons with stenosis estimated at 50% on each side. 4. No intracranial large or medium vessel occlusion or correctable proximal stenosis. 5. 30% stenosis of the left  vertebral artery origin. Aortic Atherosclerosis (ICD10-I70.0). Electronically Signed   By: Paulina Fusi M.D.   On: 08/19/2019 14:42   Ct Angio Neck W Or Wo Contrast  Result Date: 08/19/2019 CLINICAL DATA:  Focal neurological deficit EXAM: CT ANGIOGRAPHY HEAD AND NECK TECHNIQUE: Multidetector CT imaging of the head and neck was performed using the standard protocol during bolus administration of intravenous contrast. Multiplanar CT image reconstructions and MIPs were obtained to evaluate the vascular anatomy. Carotid stenosis measurements (when applicable) are obtained utilizing NASCET criteria, using the distal internal carotid diameter as the denominator. CONTRAST:  OMNIPAQUE IOHEXOL 350 MG/ML SOLN COMPARISON:  MRI 03/15/2019 FINDINGS: CT HEAD FINDINGS Brain: Generalized atrophy. No focal abnormality seen affecting the brainstem or cerebellum. Cerebral hemispheres show an old infarction of the left thalamus no other focal finding. No mass lesion, hemorrhage, hydrocephalus or extra-axial collection. Vascular: There is atherosclerotic calcification of the major vessels at the base of the brain. Skull: Negative Sinuses: Left maxillary sinus inflammatory changes. Other sinuses clear. Orbits: Negative Review of the MIP images confirms the above findings CTA NECK FINDINGS Aortic arch: Aortic atherosclerosis. No aneurysm or dissection. Branching pattern is normal without origin stenosis. Right carotid system: Common carotid artery is widely patent to the bifurcation region. There is calcified plaque at the carotid bifurcation and ICA bulb. Minimal diameter is 4.5 mm, the same as the  more distal cervical ICA, therefore no stenosis. Left carotid system: Common carotid artery widely patent to the bifurcation region. Calcified plaque at the carotid bifurcation and ICA bulb. Minimal diameter in the ICA bulb measures 2.7 mm. Compared to a more distal cervical ICA diameter 4.4 mm, this indicates a 40% stenosis.  Vertebral arteries: Calcified plaque at both vertebral artery origins. No stenosis on the right. 30% stenosis on the left. Beyond that, both vertebral arteries are widely patent through the cervical region to foramen magnum. Skeleton: Mild cervical spondylosis. Other neck: No mass or lymphadenopathy. Upper chest: Negative Review of the MIP images confirms the above findings CTA HEAD FINDINGS Anterior circulation: Both internal carotid arteries are patent through the skull base and siphon regions. There is atherosclerotic calcification throughout the carotid siphons with stenosis estimated at 50% on each side. The anterior and middle cerebral vessels are patent without proximal stenosis, aneurysm or vascular malformation. No large or medium vessel occlusion identified. Posterior circulation: Both vertebral arteries are patent through the foramen magnum to the basilar. Atherosclerotic irregularity of the V4 segments but no stenosis greater than 30%. No basilar stenosis. Superior cerebellar and posterior cerebral arteries show flow on both sides. Venous sinuses: Patent and normal. Anatomic variants: None significant. Review of the MIP images confirms the above findings IMPRESSION: 1. Aortic atherosclerosis. No aneurysm or dissection. 2. Atherosclerotic disease at both carotid bifurcations but without measurable stenosis on the right. 40% stenosis of the ICA bulb on the left. 3. Atherosclerotic disease throughout the carotid siphons with stenosis estimated at 50% on each side. 4. No intracranial large or medium vessel occlusion or correctable proximal stenosis. 5. 30% stenosis of the left vertebral artery origin. Aortic Atherosclerosis (ICD10-I70.0). Electronically Signed   By: Nelson Chimes M.D.   On: 08/19/2019 14:42   Mr Abdomen Mrcp W Wo Contast  Result Date: 08/20/2019 CLINICAL DATA:  Upper abdominal pain, abnormal LFTs EXAM: MRI ABDOMEN WITHOUT AND WITH CONTRAST (INCLUDING MRCP) TECHNIQUE: Multiplanar  multisequence MR imaging of the abdomen was performed both before and after the administration of intravenous contrast. Heavily T2-weighted images of the biliary and pancreatic ducts were obtained, and three-dimensional MRCP images were rendered by post processing. CONTRAST:  68mL GADAVIST GADOBUTROL 1 MMOL/ML IV SOLN COMPARISON:  CTA abdomen/pelvis dated 08/15/2019. Right upper quadrant ultrasound dated 08/17/2019. FINDINGS: Lower chest: Lung bases are clear. Hepatobiliary: No morphologic findings of cirrhosis. Geographic altered perfusion along the gallbladder fossa. No suspicious/enhancing hepatic lesions. Hepatic steatosis. Cholelithiasis gallbladder wall thickening and pericholecystic inflammatory changes. No intrahepatic ductal dilatation. Common duct measures 7 mm proximally and 10 mm distally. Associated 8 mm distal CBD stone at the ampulla (series 14/image 9). Pancreas:  Within normal limits. Spleen:  Within normal limits. Adrenals/Urinary Tract:  Adrenal glands are within normal limits. Right kidney is within normal limits. Status post left nephrectomy. Stomach/Bowel: Stomach is within normal limits. Visualized bowel is notable for scattered colonic diverticulosis. Vascular/Lymphatic:  No evidence of abdominal aortic aneurysm. No suspicious abdominal lymphadenopathy. Other:  No abdominal ascites. Musculoskeletal: No focal osseous lesions. IMPRESSION: Cholelithiasis with findings suspicious for acute cholecystitis. Dilated common duct, measuring up to 10 mm distally, with associated 8 mm distal CBD stone at the ampulla. These results will be called to the ordering clinician or representative by the Radiologist Assistant, and communication documented in the PACS or zVision Dashboard. Electronically Signed   By: Julian Hy M.D.   On: 08/20/2019 09:01   Vas US Carotid  Result Date: 08/19/2019 Carotid Arterial Duplex Study  Indications:       CVA. Comparison Study:  no prior Performing Technologist:  Blanch Media RVS  Examination Guidelines: A complete evaluation includes B-mode imaging, spectral Doppler, color Doppler, and power Doppler as needed of all accessible portions of each vessel. Bilateral testing is considered an integral part of a complete examination. Limited examinations for reoccurring indications may be performed as noted.  Right Carotid Findings: +----------+--------+--------+--------+------------------+--------+           PSV cm/sEDV cm/sStenosisPlaque DescriptionComments +----------+--------+--------+--------+------------------+--------+ CCA Prox  83      16              heterogenous               +----------+--------+--------+--------+------------------+--------+ CCA Distal49      12              heterogenous               +----------+--------+--------+--------+------------------+--------+ ICA Prox  47      12      1-39%   heterogenous               +----------+--------+--------+--------+------------------+--------+ ICA Distal62      21                                         +----------+--------+--------+--------+------------------+--------+ ECA       102     9                                          +----------+--------+--------+--------+------------------+--------+ +----------+--------+-------+--------+-------------------+           PSV cm/sEDV cmsDescribeArm Pressure (mmHG) +----------+--------+-------+--------+-------------------+ Subclavian105                                        +----------+--------+-------+--------+-------------------+ +---------+--------+--+--------+--+---------+ VertebralPSV cm/s42EDV cm/s14Antegrade +---------+--------+--+--------+--+---------+  Left Carotid Findings: +----------+--------+--------+--------+------------------+--------+           PSV cm/sEDV cm/sStenosisPlaque DescriptionComments +----------+--------+--------+--------+------------------+--------+ CCA Prox  87      16               heterogenous               +----------+--------+--------+--------+------------------+--------+ CCA Distal60      15              heterogenous               +----------+--------+--------+--------+------------------+--------+ ICA Prox  47      16      1-39%   heterogenous               +----------+--------+--------+--------+------------------+--------+ ICA Distal40      14                                         +----------+--------+--------+--------+------------------+--------+ ECA       89      14                                         +----------+--------+--------+--------+------------------+--------+ +----------+--------+--------+--------+-------------------+  PSV cm/sEDV cm/sDescribeArm Pressure (mmHG) +----------+--------+--------+--------+-------------------+ Subclavian99                                          +----------+--------+--------+--------+-------------------+ +---------+--------+--+--------+--+---------+ VertebralPSV cm/s36EDV cm/s10Antegrade +---------+--------+--+--------+--+---------+  Summary: Right Carotid: Velocities in the right ICA are consistent with a 1-39% stenosis. Left Carotid: Velocities in the left ICA are consistent with a 1-39% stenosis. Vertebrals:  Bilateral vertebral arteries demonstrate antegrade flow. Subclavians: Normal flow hemodynamics were seen in bilateral subclavian              arteries. *See table(s) above for measurements and observations.  Electronically signed by Delia HeadyPramod Sethi MD on 08/19/2019 at 2:06:15 PM.    Final      Jerre SimonJessica L Larae Caison, [facility] Pager [phone] Horald ChestnutM, T, W, & Friday 7:00am - 4:30pm Thursdays 7:00am -11:30am  Consults: [phone]

## 2019-08-20 NOTE — Significant Event (Addendum)
Rapid Response Event Note  Overview: Tachycardia - HR 140s   Initial Focused Assessment: Called by CRN with concerns about patient's increased HR, per nurse, HR was as high as 180s, now in the 140s and it is sustained. I asked the staff to check a temperature - it was 98 then and asked if they could get an EKG. Staff had already paged the primary MD  Upon arrival, Joseph Orr was quite drowsy but once awake, he could answer my questions, he stated that does not feel well and his abdominal pain is slightly worse now. Skin was very warm to touch, flushed, and patient was diaphoretic. HR 140s, EKG was done - confirmed ST. SBP 100-110s, MAPs 80s, RR 16-18, normal respiratory effort, breathing comfortably, 96% on 3L, lungs sounds - clear throughout. + Abdominal pain - RUQ/epigastric, radiating into the back. Per nurse, MD to come to bedside.   Interventions: -- EKG  -- STAT Labs - I did not see morning labs, so I ordered CMP/MG and CBC, K was 3.4 yesterday.  -- APAP 650 mg PO -- Ice packs applied  -- CIWA 1 (mild nausea at times)   Plan of Care: -- Monitor VS, follow MEWS Protocol -- F/U with labs  -- Should VS not after above interventions, consider transfer to PCU. At the minimum, patient should be on telemetry given change in HR.   Event Summary:  Call Time 1012 Arrival Time 1016  End Time 1110  Joseph Orr R

## 2019-08-20 NOTE — Progress Notes (Signed)
PROGRESS NOTE  Joseph Orr ZOX:[id number] DOB: 07-May-1951 DOA: 08/18/2019 PCP: [place]az[place], Betty G, MD  Brief Narrative: Patient admitted for abdominal pain he said that she also had a stroke in April he had a partial work-up.  He was admitted for abdominal pain.  Surgery has seen patient and would like to have neurology clearance in case patient needs to have surgery.  Neurology was consulted.  Consult appreciated   Interval history/Subjective: Patient stated she is doing much better abdominal pain is improved he was able to sleep for the first time in 5 days today  Update: August 20, 2019:  Patient complained that he went to the bathroom  to urinate and on returning he felt really cold and began to shake really badly nurse was notified and they found him to be tachycardic shaking his slightly hypotensive and O2 sat was low and they had to start him on oxygen rapid response was called this morning.  His temperature was also noted to be 103.  Also patient stated that he drinks 5-6 drinks a day within 10 hours and he does not think there is any problem with that.  Initially we thought he may be going through withdrawal due to his history of heavy drinking.  His MRCP was noted to be positive.  Surgery recommended ERCP and GI was consulted.  He is going to have ERCP tomorrow about 7:00.  A repeat COVID test was also done his last one was on October 12 and he was negative   Assessment/Plan #1 symptomatic cholelithiasis..  Surgery has been consulted and is considering possible laparoscopy but he is requesting neurology clearance due to his recent lacunar infarct history. His MRCP was noted to be positive.  Surgery recommended ERCP and GI was consulted.  He is going to have ERCP tomorrow about 7:00.  A repeat COVID test was also done his last one was on October 12 and he was negative   2.  Lacunar infarct in April 2020 I have consulted with neurology and he has been cleared for possible surgery  if is a candidate.  His Plavix is currently on hold  3.  Hyponatremia will replace and continue to monitor  4.  Hyperlipidemia.  Currently not on statin because of intolerance  5.  Alcohol abuse.  CIWA protocol in place continue to monitor  6.  Acute RIGOR with fever most likely due to cholangitis his initial WBC this morning was normal at 7.6  7.  UTI with positive nitrite might be causing his Reiger fever.  Patient was started on Zosyn    DVT prophylaxis: SCD Code Status: FULL Family Communication: None at bedside Disposition Plan: PENDING PROGRESS Time spent: 35 minutes  Dr. Barrie FolkUgah [facility] Pager [phone]  08/20/2019, 9:32 AM  LOS: 2 days   Consultants:  NEUROLOGY  GI  SURGERY  Procedures:  None  Antimicrobials:  Zosyn   Objective: Vitals:  Vitals:   08/20/19 0428 08/20/19 0510  BP: 122/79   Pulse: (!) 115 98  Resp: 18   Temp: 99.9 F (37.7 C)   SpO2: 95%     Exam:  Constitutional:  . Appears calm and comfortable Eyes:  . pupils and irises appear normal . Normal lids and conjunctivae ENMT:  . grossly normal hearing  . Lips appear normal . external ears, nose appear normal . Oropharynx: mucosa, tongue,posterior pharynx appear normal Neck:  . neck appears normal, no masses, normal ROM, supple . no thyromegaly Respiratory:  . CTA bilaterally, no  w/r/r.  . Respiratory effort normal. No retractions or accessory muscle use Cardiovascular:  . RRR, no m/r/g . No LE extremity edema   . Normal pedal pulses Abdomen:  . Abdomen appears normal; vague tenderness no rebound no guarding tenderness or masses . No hernias . No HSM Musculoskeletal:  . Digits/nails BUE: no clubbing, cyanosis, petechiae, infection . exam of joints, bones, muscles of at least one of following: head/neck, RUE, LUE, RLE, LLE   o strength and tone normal, no atrophy, no abnormal movements o No tenderness, masses o Normal ROM, no contractures  Skin:  . No  rashes, lesions, ulcers . palpation of skin: no induration or nodules Neurologic:  . CN 2-12 intact . Sensation all 4 extremities intact Psychiatric:  . Mental status o Mood, affect appropriate o Orientation to person, place, time  . judgment and insight appear intact     I have personally reviewed the following:   Data: 16/20 0943  Osmolality, urine  Collected: 08/19/19 8938  Final result  Specimen: Urine, Random   Osmolality, Urine 267Low  mOsm/kg          08/19/19 0936  Osmolality  Collected: 08/19/19 0500  Final result  Specimen: Blood   Osmolality 263Low  mOsm/kg          08/19/19 0753  Sodium, urine, random  Collected: 08/19/19 1017  Final result  Specimen: Urine, Random   Sodium, Urine <10 mmol/L          08/19/19 0630  Comprehensive metabolic panel  Collected: 08/19/19 0500  Edited Result - FINAL  Specimen: Blood   Sodium 123Low  C mmol/L Albumin 2.8Low  C g/dL  Potassium 5.1WCH  C mmol/L  AST 32 C U/L  Chloride 89Low  C mmol/L ALT 27 C U/L  CO2 22 C mmol/L Alkaline Phosphatase 63 C U/L  Glucose 96 C mg/dL Total Bilirubin 8.5IDPO  C mg/dL  BUN 13 C mg/dL GFR, Est Non African American >60 C mL/min  Creatinine 0.98 C mg/dL GFR, Est African American >60 C mL/min  Calcium 8.1Low  C mg/dL Anion gap 12 C   Total Protein 5.7Low  C g/dL         24/23/53 6144  CBC  Collected: 08/19/19 0500  Final result  Specimen: Blood   WBC 12.0High  K/uL MCH 34.2High  pg  RBC 3.77Low  MIL/uL MCHC 35.3 g/dL  Hemoglobin 31.5QMG  g/dL RDW 86.7 %  HCT 61.9JKD  % Platelets 126Low  K/uL   MCV 96.8 fL nRBC 0.0 %        08/19/19 0305  HIV Antibody (routine testing w rflx)  Collected: 08/18/19 2322  Final result  Specimen: Blood   HIV Screen 4th Generation wRfx NON REACTIVE          08/19/19 0052  Basic metabolic panel  Collected: 08/18/19 2322  Final result  Specimen: Blood   Sodium 123Low  mmol/L Creatinine 1.09 mg/dL  Potassium 4.6 mmol/L Calcium 8.7Low  mg/dL   Chloride 32IZT  mmol/L GFR, Est Non African American >60 mL/min  CO2 23 mmol/L GFR, Est African American >60 mL/min  Glucose 95 mg/dL Anion gap 13   BUN 15 mg/dL         24/58/09 9833  Hepatic function panel  Collected: 08/18/19 2322  Final result  Specimen: Blood   Total Protein 6.1Low  g/dL Alkaline Phosphatase 68 U/L  Albumin 3.0Low  g/dL Total Bilirubin 8.2NKNL  mg/dL  AST 37 U/L Bilirubin, Direct 1.6High  mg/dL  ALT 31 U/L Indirect Bilirubin 1.6High  mg/dL           Active Problems:   Hyperlipidemia   GERD (gastroesophageal reflux disease)   RUQ abdominal pain   History of CVA (cerebrovascular accident)   LOS: 2 days

## 2019-08-20 NOTE — H&P (View-Only) (Signed)
Referring Provider:  Lezlie Lye, MD   Primary Care Physician:  [place], Betty G, MD Primary Gastroenterologist: Russella Dar      Reason for Consultation: elevated LFTs, CBD stones, cholangitis                  ASSESSMENT /  PLAN   68 year old male with a history of TIA on Plavix, GERD, hyperlipidemia, gout, alcohol use admitted with symptomatic cholelithiasis having now developed cholecystitis with known choledocholithiasis and cholangitis  1.  Cholecystitis/cholangitis --he has developed fever concerning for cholangitis.  AST, ALT, bilirubin and alkaline phosphatase are markedly elevated in the last 24 hours.  Known common bile duct stone by MRCP. --Sepsis protocol; biliary source; I have ordered blood cultures x2 --Zosyn started per pharmacy consult --Plan ERCP tomorrow morning at 7:30 AM with Dr. Elnoria Howard; Plavix will have been stopped 4 days and so sphincterotomy may not be performed.  May need to place a temporizing common bile duct stent, with stone extraction at a later date when Plavix can be held at least 5 days. --I discussed the risk, benefits and alternatives to ERCP with the patient.  We discussed the risk of bleeding, infection, anesthesia related complications, pancreatitis, death.  He voiced understanding and wishes to proceed. --Surgery following and will plan cholecystectomy after ERCP --He has not had a COVID-19 test during this admission.  This is necessary before ERCP tomorrow.  I have ordered COVID-19 testing and requested the rapid test.  He did have a negative COVID-19 test on the 12th but was not admitted until the 15th. --Continue to hold Plavix --Hold pharmacologic DVT prophylaxis until after ERCP; continue SCDs  I discussed all of the above including the plan with the patient's wife by phone today.  She will communicate our conversation to their daughter, Aundra Millet.    HPI:     Joseph Orr is a 68 y.o. male with a past medical history of GERD, TIA on Plavix,  hyperlipidemia, gout, alcohol use and recent upper abdominal pain with nausea and vomiting found to have gallstones who was admitted for right upper quadrant pain.  He was seen in our office by Doug Sou, PA-C on 08/17/2019 to evaluate epigastric pain, nausea or vomiting.  It was felt that he was having symptomatic cholelithiasis and was sent for surgical consultation.  He saw Dr. Andrey Campanile with [facility] who recommended he be admitted for expedited neurological evaluation due to history of recent stroke and to evaluate risk for cholecystectomy.  In the last 2 days he has been noted to have rising liver enzymes in association with his upper abdominal pain.  His bilirubin has risen from 1.9 on 08/15/2019 20-6.5 today.  He is also had an acute rise in his AST and ALT in the last 24 hours with AST at 408 and ALT 160 today.  Alkaline phosphatase is elevated today at 217.  This morning he developed fever to 103 associated with tachycardia.  Rapid response was called.  MRCP was performed which showed cholelithiasis along with suspicion of acute cholecystitis.  The common bile duct is dilated to 10 mm with an 8 mm distal common duct stone seen at the ampulla.  Of note he is on chronic Plavix and his last Plavix dose was on 08/17/2019.  He had a COVID-19 PCR test negative on 08/15/2019 when he went to the ER but I do not see that this has been repeated this admission.  Currently, at the time of my examination he reports his upper  abdominal pain seems to come and go but right now is absent.  He feels very tired and weak.   Past Medical History:  Diagnosis Date   Arthritis    CVA (cerebral vascular accident) (HCC) 03/03/2019   GERD (gastroesophageal reflux disease)    Gout    TIA (transient ischemic attack) 02/2019    Past Surgical History:  Procedure Laterality Date   COLONOSCOPY     +8y   KIDNEY DONATION     KNEE ARTHROSCOPY      Prior to Admission medications   Medication Sig Start Date  End Date Taking? Authorizing Provider  acetaminophen (TYLENOL) 325 MG tablet Take 650 mg by mouth every 6 (six) hours as needed.    [provider]  albuterol (PROVENTIL HFA;VENTOLIN HFA) 108 (90 Base) MCG/ACT inhaler Inhale 2 puffs into the lungs every 4 (four) hours as needed for wheezing or shortness of breath. 04/06/18   [place], Betty G, MD  allopurinol (ZYLOPRIM) 100 MG tablet TAKE 1 TABLET BY MOUTH DAILY Patient taking differently: Take 100 mg by mouth daily.  04/19/19   [place], Betty G, MD  cetirizine (ZYRTEC) 10 MG tablet Take 10 mg by mouth daily.    [provider]  clopidogrel (PLAVIX) 75 MG tablet TAKE 1 TABLET BY MOUTH DAILY Patient taking differently: Take 75 mg by mouth daily.  06/15/19   [place], Betty G, MD  ondansetron (ZOFRAN) 4 MG tablet Take 1 tablet (4 mg total) by mouth every 6 (six) hours. 08/15/19   Terrilee Files, MD  pantoprazole (PROTONIX) 20 MG tablet Take 1 tablet (20 mg total) by mouth daily. 06/21/19   [place], Betty G, MD  prednisoLONE acetate (PRED FORTE) 1 % ophthalmic suspension Place 1 drop into the right eye daily. 06/29/19   [provider]    Current Facility-Administered Medications  Medication Dose Route Frequency Provider Last Rate Last Dose   0.9 %  sodium chloride infusion   Intravenous Continuous Myrtie Neither, MD 125 mL/hr at 08/20/19 1208     acetaminophen (TYLENOL) tablet 650 mg  650 mg Oral Q6H PRN Charlsie Quest, MD   650 mg at 08/20/19 1043   Or   acetaminophen (TYLENOL) suppository 650 mg  650 mg Rectal Q6H PRN Charlsie Quest, MD       morphine 2 MG/ML injection 1 mg  1 mg Intravenous Q3H PRN Charlsie Quest, MD   1 mg at 08/20/19 0945   ondansetron (ZOFRAN) tablet 4 mg  4 mg Oral Q6H PRN Charlsie Quest, MD       Or   ondansetron (ZOFRAN) injection 4 mg  4 mg Intravenous Q6H PRN Charlsie Quest, MD   4 mg at 08/20/19 0536   piperacillin-tazobactam (ZOSYN) IVPB 3.375 g  3.375 g Intravenous Q8H [place],  Yujing Z, RPH       sodium chloride flush (NS) 0.9 % injection 10-40 mL  10-40 mL Intracatheter PRN Hall, Carole N, DO       zolpidem (AMBIEN) tablet 5 mg  5 mg Oral QHS PRN Charlsie Quest, MD   5 mg at 08/19/19 2123    Allergies as of 08/18/2019 - Review Complete 08/17/2019  Allergen Reaction Noted   Oxycodone-acetaminophen Nausea And Vomiting 03/11/2017   Atorvastatin Nausea And Vomiting 08/15/2019   Rosuvastatin Nausea And Vomiting 08/15/2019    Family History  Problem Relation Age of Onset   Arthritis Mother    Arthritis Father    Pneumonia Father  Died age 10   Stroke Brother     Social History   Socioeconomic History   Marital status: Married    Spouse name: Not on file   Number of children: Not on file   Years of education: Not on file   Highest education level: Not on file  Occupational History   Occupation: Radiation protection practitioner strain: Not on file   Food insecurity    Worry: Not on file    Inability: Not on file   Transportation needs    Medical: Not on file    Non-medical: Not on file  Tobacco Use   Smoking status: Former Smoker    Quit date: 11/03/1998    Years since quitting: 20.8   Smokeless tobacco: Never Used  Substance and Sexual Activity   Alcohol use: Yes    Alcohol/week: 3.0 - 4.0 standard drinks    Types: 3 - 4 Cans of beer per week    Comment: daily   Drug use: Never   Sexual activity: Not on file  Social History Narrative   Moved here to be with her daughter who works at [employer]    Review of Systems: All systems reviewed and negative except where noted in HPI.  Physical Exam: Vital signs in last 24 hours: Temp:  [98.2 F (36.8 C)-103.1 F (39.5 C)] 98.7 F (37.1 C) (10/17 1210) Pulse Rate:  [82-143] 109 (10/17 1210) Resp:  [18-20] 18 (10/17 1210) BP: (108-167)/(64-85) 108/64 (10/17 1210) SpO2:  [95 %-99 %] 97 % (10/17 1210) Last BM Date: 08/19/19 General:   Awake, alert, NAD with mild  diaphoresis Psych:  Pleasant, cooperative. Normal mood and affect. Eyes:  Pupils equal, sclera clear, sclera anicteric Neck:  Supple; no masses Lungs:  Clear throughout to auscultation.   No wheezes, crackles, or rhonchi.  Heart:  Regular rate and rhythm; no murmurs, no lower extremity edema Abdomen:  Soft, non-distended, moderate to significant tenderness in the epigastrium right upper quadrant without rebound, BS active, no palp mass   Rectal:  Deferred  Msk:  Symmetrical without gross deformities.  SCDs in place bilateral lower extremities Neurologic:  Alert and  oriented x4;  grossly normal neurologically. Skin:  Intact without significant lesions or rashes.   Intake/Output from previous day: 10/16 0701 - 10/17 0700 In: 662.5 [I.V.:662.5] Out: -  Intake/Output this shift: No intake/output data recorded.  Lab Results: Recent Labs    08/18/19 2322 08/19/19 0500 08/20/19 1047  WBC 12.4* 12.0* 7.6  HGB 13.2 12.9* 13.0  HCT 37.4* 36.5* 35.7*  PLT 127* 126* 139*   BMET Recent Labs    08/18/19 2322 08/19/19 0500 08/20/19 1047  NA 123* 123* 131*  K 4.6 3.4* 3.7  CL 87* 89* 97*  CO2 23 22 17*  GLUCOSE 95 96 74  BUN CREATININE 1.09 0.98 1.46*  CALCIUM 8.7* 8.1* 8.0*   LFT Recent Labs    08/18/19 2322  08/20/19 1047  PROT 6.1*   < > 5.3*  ALBUMIN 3.0*   < > 2.5*  AST 37   < > 408*  ALT 31   < > 160*  ALKPHOS 68   < > 217*  BILITOT 3.2*   < > 6.5*  BILIDIR 1.6*  --   --   IBILI 1.6*  --   --    < > = values in this interval not displayed.   PT/INR No results for input(s): LABPROT, INR in  the last 72 hours. Hepatitis Panel No results for input(s): HEPBSAG, HCVAB, HEPAIGM, HEPBIGM in the last 72 hours.   . CBC Latest Ref Rng & Units 08/20/2019 08/19/2019 08/18/2019  WBC 4.0 - 10.5 K/uL 7.6 12.0(H) 12.4(H)  Hemoglobin 13.0 - 17.0 g/dL 13.0 12.9(L) 13.2  Hematocrit 39.0 - 52.0 % 35.7(L) 36.5(L) 37.4(L)  Platelets 150 - 400 K/uL 139(L) 126(L)  127(L)    . CMP Latest Ref Rng & Units 08/20/2019 08/19/2019 08/18/2019  Glucose 70 - 99 mg/dL 74 96 95  BUN 8 - 23 mg/dL 13 13 15   Creatinine 0.61 - 1.24 mg/dL 1.46(H) 0.98 1.09  Sodium 135 - 145 mmol/L 131(L) 123(L) 123(L)  Potassium 3.5 - 5.1 mmol/L 3.7 3.4(L) 4.6  Chloride 98 - 111 mmol/L 97(L) 89(L) 87(L)  CO2 22 - 32 mmol/L 17(L) 22 23  Calcium 8.9 - 10.3 mg/dL 8.0(L) 8.1(L) 8.7(L)  Total Protein 6.5 - 8.1 g/dL 5.3(L) 5.7(L) 6.1(L)  Total Bilirubin 0.3 - 1.2 mg/dL 6.5(H) 3.1(H) 3.2(H)  Alkaline Phos 38 - 126 U/L 217(H) 63 68  AST 15 - 41 U/L 408(H) 32 37  ALT 0 - 44 U/L 160(H) 27 31   Studies/Results: Ct Angio Head W Or Wo Contrast  Result Date: 08/19/2019 CLINICAL DATA:  Focal neurological deficit EXAM: CT ANGIOGRAPHY HEAD AND NECK TECHNIQUE: Multidetector CT imaging of the head and neck was performed using the standard protocol during bolus administration of intravenous contrast. Multiplanar CT image reconstructions and MIPs were obtained to evaluate the vascular anatomy. Carotid stenosis measurements (when applicable) are obtained utilizing NASCET criteria, using the distal internal carotid diameter as the denominator. CONTRAST:  147mL OMNIPAQUE IOHEXOL 350 MG/ML SOLN COMPARISON:  MRI 03/15/2019 FINDINGS: CT HEAD FINDINGS Brain: Generalized atrophy. No focal abnormality seen affecting the brainstem or cerebellum. Cerebral hemispheres show an old infarction of the left thalamus no other focal finding. No mass lesion, hemorrhage, hydrocephalus or extra-axial collection. Vascular: There is atherosclerotic calcification of the major vessels at the base of the brain. Skull: Negative Sinuses: Left maxillary sinus inflammatory changes. Other sinuses clear. Orbits: Negative Review of the MIP images confirms the above findings CTA NECK FINDINGS Aortic arch: Aortic atherosclerosis. No aneurysm or dissection. Branching pattern is normal without origin stenosis. Right carotid system: Common  carotid artery is widely patent to the bifurcation region. There is calcified plaque at the carotid bifurcation and ICA bulb. Minimal diameter is 4.5 mm, the same as the more distal cervical ICA, therefore no stenosis. Left carotid system: Common carotid artery widely patent to the bifurcation region. Calcified plaque at the carotid bifurcation and ICA bulb. Minimal diameter in the ICA bulb measures 2.7 mm. Compared to a more distal cervical ICA diameter 4.4 mm, this indicates a 40% stenosis. Vertebral arteries: Calcified plaque at both vertebral artery origins. No stenosis on the right. 30% stenosis on the left. Beyond that, both vertebral arteries are widely patent through the cervical region to foramen magnum. Skeleton: Mild cervical spondylosis. Other neck: No mass or lymphadenopathy. Upper chest: Negative Review of the MIP images confirms the above findings CTA HEAD FINDINGS Anterior circulation: Both internal carotid arteries are patent through the skull base and siphon regions. There is atherosclerotic calcification throughout the carotid siphons with stenosis estimated at 50% on each side. The anterior and middle cerebral vessels are patent without proximal stenosis, aneurysm or vascular malformation. No large or medium vessel occlusion identified. Posterior circulation: Both vertebral arteries are patent through the foramen magnum to the basilar. Atherosclerotic irregularity of  the V4 segments but no stenosis greater than 30%. No basilar stenosis. Superior cerebellar and posterior cerebral arteries show flow on both sides. Venous sinuses: Patent and normal. Anatomic variants: None significant. Review of the MIP images confirms the above findings IMPRESSION: 1. Aortic atherosclerosis. No aneurysm or dissection. 2. Atherosclerotic disease at both carotid bifurcations but without measurable stenosis on the right. 40% stenosis of the ICA bulb on the left. 3. Atherosclerotic disease throughout the carotid  siphons with stenosis estimated at 50% on each side. 4. No intracranial large or medium vessel occlusion or correctable proximal stenosis. 5. 30% stenosis of the left vertebral artery origin. Aortic Atherosclerosis (ICD10-I70.0). Electronically Signed   By: Paulina Fusi M.D.   On: 08/19/2019 14:42   Ct Angio Neck W Or Wo Contrast  Result Date: 08/19/2019 CLINICAL DATA:  Focal neurological deficit EXAM: CT ANGIOGRAPHY HEAD AND NECK TECHNIQUE: Multidetector CT imaging of the head and neck was performed using the standard protocol during bolus administration of intravenous contrast. Multiplanar CT image reconstructions and MIPs were obtained to evaluate the vascular anatomy. Carotid stenosis measurements (when applicable) are obtained utilizing NASCET criteria, using the distal internal carotid diameter as the denominator. CONTRAST:  OMNIPAQUE IOHEXOL 350 MG/ML SOLN COMPARISON:  MRI 03/15/2019 FINDINGS: CT HEAD FINDINGS Brain: Generalized atrophy. No focal abnormality seen affecting the brainstem or cerebellum. Cerebral hemispheres show an old infarction of the left thalamus no other focal finding. No mass lesion, hemorrhage, hydrocephalus or extra-axial collection. Vascular: There is atherosclerotic calcification of the major vessels at the base of the brain. Skull: Negative Sinuses: Left maxillary sinus inflammatory changes. Other sinuses clear. Orbits: Negative Review of the MIP images confirms the above findings CTA NECK FINDINGS Aortic arch: Aortic atherosclerosis. No aneurysm or dissection. Branching pattern is normal without origin stenosis. Right carotid system: Common carotid artery is widely patent to the bifurcation region. There is calcified plaque at the carotid bifurcation and ICA bulb. Minimal diameter is 4.5 mm, the same as the more distal cervical ICA, therefore no stenosis. Left carotid system: Common carotid artery widely patent to the bifurcation region. Calcified plaque at the carotid  bifurcation and ICA bulb. Minimal diameter in the ICA bulb measures 2.7 mm. Compared to a more distal cervical ICA diameter 4.4 mm, this indicates a 40% stenosis. Vertebral arteries: Calcified plaque at both vertebral artery origins. No stenosis on the right. 30% stenosis on the left. Beyond that, both vertebral arteries are widely patent through the cervical region to foramen magnum. Skeleton: Mild cervical spondylosis. Other neck: No mass or lymphadenopathy. Upper chest: Negative Review of the MIP images confirms the above findings CTA HEAD FINDINGS Anterior circulation: Both internal carotid arteries are patent through the skull base and siphon regions. There is atherosclerotic calcification throughout the carotid siphons with stenosis estimated at 50% on each side. The anterior and middle cerebral vessels are patent without proximal stenosis, aneurysm or vascular malformation. No large or medium vessel occlusion identified. Posterior circulation: Both vertebral arteries are patent through the foramen magnum to the basilar. Atherosclerotic irregularity of the V4 segments but no stenosis greater than 30%. No basilar stenosis. Superior cerebellar and posterior cerebral arteries show flow on both sides. Venous sinuses: Patent and normal. Anatomic variants: None significant. Review of the MIP images confirms the above findings IMPRESSION: 1. Aortic atherosclerosis. No aneurysm or dissection. 2. Atherosclerotic disease at both carotid bifurcations but without measurable stenosis on the right. 40% stenosis of the ICA bulb on the left. 3. Atherosclerotic disease throughout  the carotid siphons with stenosis estimated at 50% on each side. 4. No intracranial large or medium vessel occlusion or correctable proximal stenosis. 5. 30% stenosis of the left vertebral artery origin. Aortic Atherosclerosis (ICD10-I70.0). Electronically Signed   By: Paulina FusiMark  Shogry M.D.   On: 08/19/2019 14:42   Mr Abdomen Mrcp W Wo Contast  Result  Date: 08/20/2019 CLINICAL DATA:  Upper abdominal pain, abnormal LFTs EXAM: MRI ABDOMEN WITHOUT AND WITH CONTRAST (INCLUDING MRCP) TECHNIQUE: Multiplanar multisequence MR imaging of the abdomen was performed both before and after the administration of intravenous contrast. Heavily T2-weighted images of the biliary and pancreatic ducts were obtained, and three-dimensional MRCP images were rendered by post processing. CONTRAST:  8mL GADAVIST GADOBUTROL 1 MMOL/ML IV SOLN COMPARISON:  CTA abdomen/pelvis dated 08/15/2019. Right upper quadrant ultrasound dated 08/17/2019. FINDINGS: Lower chest: Lung bases are clear. Hepatobiliary: No morphologic findings of cirrhosis. Geographic altered perfusion along the gallbladder fossa. No suspicious/enhancing hepatic lesions. Hepatic steatosis. Cholelithiasis gallbladder wall thickening and pericholecystic inflammatory changes. No intrahepatic ductal dilatation. Common duct measures 7 mm proximally and 10 mm distally. Associated 8 mm distal CBD stone at the ampulla (series 14/image 9). Pancreas:  Within normal limits. Spleen:  Within normal limits. Adrenals/Urinary Tract:  Adrenal glands are within normal limits. Right kidney is within normal limits. Status post left nephrectomy. Stomach/Bowel: Stomach is within normal limits. Visualized bowel is notable for scattered colonic diverticulosis. Vascular/Lymphatic:  No evidence of abdominal aortic aneurysm. No suspicious abdominal lymphadenopathy. Other:  No abdominal ascites. Musculoskeletal: No focal osseous lesions. IMPRESSION: Cholelithiasis with findings suspicious for acute cholecystitis. Dilated common duct, measuring up to 10 mm distally, with associated 8 mm distal CBD stone at the ampulla. These results will be called to the ordering clinician or representative by the Radiologist Assistant, and communication documented in the PACS or zVision Dashboard. Electronically Signed   By: Charline BillsSriyesh  Krishnan M.D.   On: 08/20/2019 09:01      Active Problems:   Hyperlipidemia   GERD (gastroesophageal reflux disease)   RUQ abdominal pain   History of CVA (cerebrovascular accident)     Carie CaddyJay M. Zeric Baranowski, M.D. @  08/20/2019, 12:26 PM

## 2019-08-21 ENCOUNTER — Encounter (HOSPITAL_COMMUNITY): Payer: Self-pay | Admitting: *Deleted

## 2019-08-21 ENCOUNTER — Inpatient Hospital Stay (HOSPITAL_COMMUNITY): Payer: Medicare HMO

## 2019-08-21 ENCOUNTER — Encounter (HOSPITAL_COMMUNITY)
Admission: AD | Disposition: A | Discharge status: Home / Self Care | Payer: Self-pay | Source: Ambulatory Visit | Attending: Internal Medicine

## 2019-08-21 ENCOUNTER — Inpatient Hospital Stay (HOSPITAL_COMMUNITY): Payer: Medicare HMO | Admitting: Certified Registered Nurse Anesthetist

## 2019-08-21 DIAGNOSIS — A419 Sepsis, unspecified organism: Secondary | ICD-10-CM

## 2019-08-21 DIAGNOSIS — R6520 Severe sepsis without septic shock: Secondary | ICD-10-CM

## 2019-08-21 HISTORY — PX: ERCP: SHX5425

## 2019-08-21 HISTORY — PX: SPHINCTEROTOMY: SHX5544

## 2019-08-21 HISTORY — PX: REMOVAL OF STONES: SHX5545

## 2019-08-21 LAB — COMPREHENSIVE METABOLIC PANEL
ALT: 124 U/L — ABNORMAL HIGH (ref 0–44)
AST: 191 U/L — ABNORMAL HIGH (ref 15–41)
Albumin: 2.2 g/dL — ABNORMAL LOW (ref 3.5–5.0)
Alkaline Phosphatase: 147 U/L — ABNORMAL HIGH (ref 38–126)
Anion gap: 13 (ref 5–15)
BUN: 18 mg/dL (ref 8–23)
CO2: 17 mmol/L — ABNORMAL LOW (ref 22–32)
Calcium: 7.5 mg/dL — ABNORMAL LOW (ref 8.9–10.3)
Chloride: 101 mmol/L (ref 98–111)
Creatinine, Ser: 1.46 mg/dL — ABNORMAL HIGH (ref 0.61–1.24)
GFR calc Af Amer: 56 mL/min — ABNORMAL LOW (ref >60)
GFR calc non Af Amer: 49 mL/min — ABNORMAL LOW (ref >60)
Glucose, Bld: 220 mg/dL — ABNORMAL HIGH (ref 70–99)
Potassium: 3.4 mmol/L — ABNORMAL LOW (ref 3.5–5.1)
Sodium: 131 mmol/L — ABNORMAL LOW (ref 135–145)
Total Bilirubin: 5.8 mg/dL — ABNORMAL HIGH (ref 0.3–1.2)
Total Protein: 5.3 g/dL — ABNORMAL LOW (ref 6.5–8.1)

## 2019-08-21 LAB — CBC
HCT: 36 % — ABNORMAL LOW (ref 39.0–52.0)
Hemoglobin: 12.3 g/dL — ABNORMAL LOW (ref 13.0–17.0)
MCH: 33.9 pg (ref 26.0–34.0)
MCHC: 34.2 g/dL (ref 30.0–36.0)
MCV: 99.2 fL (ref 80.0–100.0)
Platelets: UNDETERMINED 10*3/uL (ref 150–400)
RBC: 3.63 MIL/uL — ABNORMAL LOW (ref 4.22–5.81)
RDW: 12 % (ref 11.5–15.5)
WBC: 6.8 10*3/uL (ref 4.0–10.5)
nRBC: 0 % (ref 0.0–0.2)

## 2019-08-21 LAB — BLOOD CULTURE ID PANEL (REFLEXED)

## 2019-08-21 LAB — SURGICAL PCR SCREEN
MRSA, PCR: NEGATIVE
Staphylococcus aureus: POSITIVE — AB

## 2019-08-21 LAB — URINE CULTURE: Culture: <10000 — AB

## 2019-08-21 LAB — GLUCOSE, CAPILLARY: Glucose-Capillary: 158 mg/dL — ABNORMAL HIGH (ref 70–99)

## 2019-08-21 SURGERY — ERCP, WITH INTERVENTION IF INDICATED
Anesthesia: General

## 2019-08-21 MED ORDER — SODIUM CHLORIDE 0.9 % IV SOLN: INTRAVENOUS | Status: DC

## 2019-08-21 MED ORDER — METRONIDAZOLE IN NACL 5-0.79 MG/ML-% IV SOLN
500.0000 mg | Freq: Three times a day (TID) | INTRAVENOUS | Status: DC
  Administered 2019-08-21 – 2019-08-23 (×6): 500 mg via INTRAVENOUS
  Filled 2019-08-21 (×6): qty 100

## 2019-08-21 MED ORDER — DEXAMETHASONE SODIUM PHOSPHATE 10 MG/ML IJ SOLN
INTRAMUSCULAR | Status: DC | PRN
  Administered 2019-08-21: 10 mg via INTRAVENOUS

## 2019-08-21 MED ORDER — SUGAMMADEX SODIUM 200 MG/2ML IV SOLN
INTRAVENOUS | Status: DC | PRN
  Administered 2019-08-21: 200 mg via INTRAVENOUS

## 2019-08-21 MED ORDER — SUCCINYLCHOLINE CHLORIDE 200 MG/10ML IV SOSY
PREFILLED_SYRINGE | INTRAVENOUS | Status: DC | PRN
  Administered 2019-08-21: 120 mg via INTRAVENOUS

## 2019-08-21 MED ORDER — PROPOFOL 10 MG/ML IV BOLUS
INTRAVENOUS | Status: DC | PRN
  Administered 2019-08-21: 170 mg via INTRAVENOUS

## 2019-08-21 MED ORDER — INSULIN ASPART 100 UNIT/ML ~~LOC~~ SOLN
0.0000 [IU] | Freq: Three times a day (TID) | SUBCUTANEOUS | Status: DC
  Administered 2019-08-22: 1 [IU] via SUBCUTANEOUS

## 2019-08-21 MED ORDER — SODIUM CHLORIDE 0.9 % IV SOLN
INTRAVENOUS | Status: DC | PRN
  Administered 2019-08-21: 08:00:00 20 mL

## 2019-08-21 MED ORDER — PHENYLEPHRINE HCL (PRESSORS) 10 MG/ML IV SOLN
INTRAVENOUS | Status: DC | PRN
  Administered 2019-08-21: 120 ug via INTRAVENOUS
  Administered 2019-08-21: 80 ug via INTRAVENOUS
  Administered 2019-08-21: 40 ug via INTRAVENOUS
  Administered 2019-08-21: 80 ug via INTRAVENOUS

## 2019-08-21 MED ORDER — LIDOCAINE 2% (20 MG/ML) 5 ML SYRINGE
INTRAMUSCULAR | Status: DC | PRN
  Administered 2019-08-21: 60 mg via INTRAVENOUS

## 2019-08-21 MED ORDER — INDOMETHACIN 50 MG RE SUPP
RECTAL | Status: DC | PRN
  Administered 2019-08-21: 100 mg via RECTAL

## 2019-08-21 MED ORDER — SODIUM CHLORIDE 0.9 % IV SOLN
2.0000 g | INTRAVENOUS | Status: DC
  Administered 2019-08-21 – 2019-08-24 (×4): 2 g via INTRAVENOUS
  Filled 2019-08-21: qty 2
  Filled 2019-08-21: qty 20
  Filled 2019-08-21 (×2): qty 2
  Filled 2019-08-21: qty 20

## 2019-08-21 MED ORDER — INSULIN ASPART 100 UNIT/ML ~~LOC~~ SOLN: 0.0000 [IU] | Freq: Every day | SUBCUTANEOUS | Status: DC

## 2019-08-21 MED ORDER — ONDANSETRON HCL 4 MG/2ML IJ SOLN
INTRAMUSCULAR | Status: DC | PRN
  Administered 2019-08-21: 4 mg via INTRAVENOUS

## 2019-08-21 MED ORDER — HYDROCODONE-ACETAMINOPHEN 5-325 MG PO TABS
1.0000 | ORAL_TABLET | ORAL | Status: DC | PRN
  Administered 2019-08-21: 1 via ORAL
  Filled 2019-08-21: qty 1

## 2019-08-21 MED ORDER — SODIUM CHLORIDE 0.9 % IV SOLN
INTRAVENOUS | Status: DC
  Administered 2019-08-21: 19:00:00 via INTRAVENOUS

## 2019-08-21 MED ORDER — FENTANYL CITRATE (PF) 250 MCG/5ML IJ SOLN
INTRAMUSCULAR | Status: DC | PRN
  Administered 2019-08-21 (×2): 50 ug via INTRAVENOUS

## 2019-08-21 MED ORDER — MIDAZOLAM HCL 2 MG/2ML IJ SOLN
INTRAMUSCULAR | Status: DC | PRN
  Administered 2019-08-21: 2 mg via INTRAVENOUS

## 2019-08-21 MED ORDER — INDOMETHACIN 50 MG RE SUPP
RECTAL | Status: AC
  Filled 2019-08-21: qty 2

## 2019-08-21 MED ORDER — GLUCAGON HCL RDNA (DIAGNOSTIC) 1 MG IJ SOLR
INTRAMUSCULAR | Status: AC
  Filled 2019-08-21: qty 1

## 2019-08-21 MED ORDER — ROCURONIUM BROMIDE 10 MG/ML (PF) SYRINGE
PREFILLED_SYRINGE | INTRAVENOUS | Status: DC | PRN
  Administered 2019-08-21: 30 mg via INTRAVENOUS

## 2019-08-21 NOTE — Progress Notes (Addendum)
PROGRESS NOTE    Joseph Orr  EAV:[id number] DOB: 22-Apr-1951 DOA: 08/18/2019 PCP: [place], Betty G, MD      Brief Narrative:  Joseph Orr is a 68 y.o. M with hx CVA, alcohol use, who presented with 1 week indigestion, then N/V and then new right sided abdominal pain.  In the ER, he had elevated LFTs, sent for outpatient GI referral, where [place] RUQ showed sludge in GB and 8 mm CBD and he was referred to Surgery, where he was direct admitted for cholecystectomy vs percutaneous drain.       Assessment & Plan:  Acute cholecystitis with cholangitis Sepsis from Klebsiella bacteremia Went for ERCP today, pus in the ampulla, stones removed.  Blood cultures today growing Klebsiella. -Clear liquids -Stop Zosyn -Start Ceftriaxone and Flagyl -Consult GI, appreciate cares -Consult Gen Surg, plan for lap chole tomorrow     Recent stroke This was as an outpatient around May 1, had some right hand weakness. MRI obtained as outpatient showed lacunar stroke.  Did not follow up with Neurology. Had secondary stroke work up with echo, carotids, A1c, Lipids that was unremarkable. On Plavix since stroke (>3 months).  Neurology evaluated and cleared for surgery.    Hyponatremia Mild, asymptomatic, stable.  Urine studies suggest intravascular depletion.  AKI Cr 0.9 at baseline, yesterday jumped to 1.4.  Stable today. -Continue IV fluids -Repeat Cr tomorrow  Hyperglycemia No history diabetes. -Check A1c -Start SSI  Transaminitis From cholangitis -Trend LFTs  Non gap metabolic acidosis Mild -Trend BMP       MDM and disposition: The below labs and imaging reports were reviewed and summarized above.  Medication management as above.  The patient was admitted with cholangitis.  He has now undergone ERCP with stone removal.  He will have lap chole tomorrow.         DVT prophylaxis: SCDs for now, start Lovenox after surgery Code Status: FULL Family Communication:  Wife    Consultants:   Gen Surg  GI  Procedures:   10/18 ERCP Findings:      The major papilla was normal. The bile duct was deeply cannulated with       the short-nosed traction sphincterotome. Contrast was injected. I       personally interpreted the bile duct images. There was brisk flow of       contrast through the ducts. Image quality was excellent. Contrast       extended to the hepatic ducts. The common bile duct contained one stone,       which was 7 mm in diameter. Pus was emerging from the major papilla. A       long 0.025 inch Al Pimple was passed into the biliary tree. A 12 mm       biliary sphincterotomy was made with a monofilament traction (standard)       sphincterotome using ERBE electrocautery. There was self limited oozing       from the sphincterotomy which did not require treatment. The biliary       tree was swept with a 12 mm balloon starting at the bifurcation. One       stone was removed. No stones remained. Pus was swept from the duct.      The initial view of the ampulla was positive for cholangitis as there       was pus and mucus extruding from the orifice. There was also a suspcion       of bleeding from the  biliary tract. Cannulation of the ampulla and then       wire placement was successful into the right hepatic ducts. Contrast       injection revealed a mildly dilated CBD was 8-9 mm and a mid CBD stone.       The stone was successfully extracted with the 12 mm balloon. The       occlusion cholangiogram was negative for any retained stones. The       contrast drinaed rapidly and there was a resolution of the pus drainage,       however, some bleeding was noted. With prolonged observation the       bleeding did arrest spontaneously. Impression:               - The major papilla appeared normal.                           - Pus was seen in the major papilla.                           - Choledocholithiasis was found. Complete removal                             was accomplished by biliary sphincterotomy and                            balloon extraction.                           - A biliary sphincterotomy was performed.                           - The biliary tree was swept and pus was found. Recommendation:           - Return patient to hospital ward for ongoing care.                           - NPO.                           - Zosyn (piperacillin tazobactam) 3.375 gm IV q 6                            hr.                           - Lap chole per Surgery.  Culture data:   10/17 blood culture x2 --  3/4 positive for klebsiella pneumoniae   10/17 urine culture -- insig growth   Subjective: Fever overnight.  Abdominal pain improved after procedure.  No confusion, respiratory distress.  Objective: Vitals:   08/21/19 0855 08/21/19 0905 08/21/19 0920 08/21/19 1339  BP: (!) 113/52 131/74 135/62 128/81  Pulse:  (!) 106  73  Resp: (!) 25 19 (!) 29 19  Temp: 98.6 F (37 C)   98.1 F (36.7 C)  TempSrc: Oral   Oral  SpO2: 100% 100% 98% 98%  Weight:      Height:        Intake/Output Summary (Last 24 hours) at 08/21/2019  4098 Last data filed at 08/21/2019 1634 Gross per 24 hour  Intake 2813.76 ml  Output 501 ml  Net 2312.76 ml   Filed Weights   08/19/19 0112 08/21/19 0710  Weight: 87.1 kg 87.1 kg    Examination: General appearance:  adult male, alert and in no acute distress.  Tired. HEENT: Anicteric, conjunctiva pink, lids and lashes normal. No nasal deformity, discharge, epistaxis.  Lips moist, dentition onrmal, no OP lesions, OP moist.   Skin: Warm and dry.  no jaundice.  No suspicious rashes or lesions. Cardiac: RRR, nl S1-S2, no murmurs appreciated.  Capillary refill is brisk.  JVP normal.  No LE edema.  Radia  pulses 2+ and symmetric. Respiratory: Normal respiratory rate and rhythm.  CTAB without rales or wheezes. Abdomen: Abdomen soft.  No focal TTP or gaurding. No ascites, distension, hepatosplenomegaly.   MSK: No  deformities or effusions. Neuro: Awake and alert.  EOMI, moves all extremities. Speech fluent.    Psych: Sensorium intact and responding to questions, attention normal. Affect normal.  Judgment and insight appear normal.    Data Reviewed: I have personally reviewed following labs and imaging studies:  CBC: Recent Labs  Lab 08/18/19 2322 08/19/19 0500 08/20/19 1047 08/20/19 2119 08/21/19 1208  WBC 12.4* 12.0* 7.6 8.1 6.8  NEUTROABS 9.8*  --   --  6.4  --   HGB 13.2 12.9* 13.0 11.1* 12.3*  HCT 37.4* 36.5* 35.7* 32.6* 36.0*  MCV 97.1 96.8 96.0 98.8 99.2  PLT 127* 126* 139* PLATELET CLUMPS NOTED ON SMEAR, UNABLE TO ESTIMATE PLATELET CLUMPS NOTED ON SMEAR, UNABLE TO ESTIMATE   Basic Metabolic Panel: Recent Labs  Lab 08/15/19 1454 08/18/19 2322 08/19/19 0500 08/20/19 1047 08/21/19 1208  NA 130* 123* 123* 131* 131*  K 4.4 4.6 3.4* 3.7 3.4*  CL 94* 87* 89* 97* 101  CO2 17* 17*  GLUCOSE 137* 95 96 74 220*  BUN CREATININE 1.03 1.09 0.98 1.46* 1.46*  CALCIUM 8.8* 8.7* 8.1* 8.0* 7.5*  MG  --   --   --  1.5*  --    GFR: Estimated Creatinine Clearance: 52.9 mL/min (A) (by C-G formula based on SCr of 1.46 mg/dL (H)). Liver Function Tests: Recent Labs  Lab 08/15/19 1454 08/18/19 2322 08/19/19 0500 08/20/19 1047 08/21/19 1208  AST 75* 37 32 408* 191*  ALT 35 31 27 160* 124*  ALKPHOS 90 68 63 217* 147*  BILITOT 1.9* 3.2* 3.1* 6.5* 5.8*  PROT 6.6 6.1* 5.7* 5.3* 5.3*  ALBUMIN 3.8 3.0* 2.8* 2.5* 2.2*   Recent Labs  Lab 08/15/19 1454 08/20/19 1321  LIPASE 32 18  AMYLASE  --  19*   No results for input(s): AMMONIA in the last 168 hours. Coagulation Profile: Recent Labs  Lab 08/20/19 1321  INR 1.5*   Cardiac Enzymes: No results for input(s): CKTOTAL, CKMB, CKMBINDEX, TROPONINI in the last 168 hours. BNP (last 3 results) No results for input(s): PROBNP in the last 8760 hours. HbA1C: No results for input(s): HGBA1C in the last 72  hours. CBG: No results for input(s): GLUCAP in the last 168 hours. Lipid Profile: No results for input(s): CHOL, HDL, LDLCALC, TRIG, CHOLHDL, LDLDIRECT in the last 72 hours. Thyroid Function Tests: No results for input(s): TSH, T4TOTAL, FREET4, T3FREE, THYROIDAB in the last 72 hours. Anemia Panel: No results for input(s): VITAMINB12, FOLATE, FERRITIN, TIBC, IRON, RETICCTPCT in the last 72 hours. Urine analysis:    Component Value Date/Time  COLORURINE AMBER (A) 08/20/2019 1411   APPEARANCEUR HAZY (A) 08/20/2019 1411   LABSPEC 1.026 08/20/2019 1411   PHURINE 5.0 08/20/2019 1411   GLUCOSEU NEGATIVE 08/20/2019 1411   HGBUR SMALL (A) 08/20/2019 1411   BILIRUBINUR MODERATE (A) 08/20/2019 1411   KETONESUR NEGATIVE 08/20/2019 1411   PROTEINUR 100 (A) 08/20/2019 1411   NITRITE POSITIVE (A) 08/20/2019 1411   LEUKOCYTESUR NEGATIVE 08/20/2019 1411   Sepsis Labs: (procalcitonin:4,lacticacidven:4)  ) Recent Results (from the past 240 hour(s))  SARS CORONAVIRUS 2 (TAT 6-24 HRS) Nasopharyngeal Nasopharyngeal Swab     Status: None   Collection Time: 08/15/19  3:09 PM   Specimen: Nasopharyngeal Swab  Result Value Ref Range Status   SARS Coronavirus 2 NEGATIVE NEGATIVE Final    Comment: (NOTE) SARS-CoV-2 target nucleic acids are NOT DETECTED. The SARS-CoV-2 RNA is generally detectable in upper and lower respiratory specimens during the acute phase of infection. Negative results do not preclude SARS-CoV-2 infection, do not rule out co-infections with other pathogens, and should not be used as the sole basis for treatment or other patient management decisions. Negative results must be combined with clinical observations, patient history, and epidemiological information. The expected result is Negative. Fact Sheet for Patients: HairSlick.no Fact Sheet for Healthcare Providers: quierodirigir.com This test is not yet approved or  cleared by the [place] FDA and  has been authorized for detection and/or diagnosis of SARS-CoV-2 by FDA under an Emergency Use Authorization (EUA). This EUA will remain  in effect (meaning this test can be used) for the duration of the COVID-19 declaration under Section 56 4(b)(1) of the Act, 21 U.S.C. section 360bbb-3(b)(1), unless the authorization is terminated or revoked sooner. Performed at [facility], [address], [place], [place] [address]   Culture, blood (Routine X 2) w Reflex to ID Panel     Status: None (Preliminary result)   Collection Time: 08/20/19  1:15 PM   Specimen: BLOOD  Result Value Ref Range Status   Specimen Description BLOOD LEFT ANTECUBITAL  Final   Special Requests   Final    BOTTLES DRAWN AEROBIC AND ANAEROBIC Blood Culture adequate volume   Culture  Setup Time   Final    GRAM NEGATIVE RODS IN BOTH AEROBIC AND ANAEROBIC BOTTLES CRITICAL RESULT CALLED TO, READ BACK BY AND VERIFIED WITH: PHARMD E MARTIN [id number] AT 1250 BY CM Performed at [facility], [address], [place], [place] [address]    Culture GRAM NEGATIVE RODS  Final   Report Status PENDING  Incomplete  Blood Culture ID Panel (Reflexed)     Status: Abnormal   Collection Time: 08/20/19  1:15 PM  Result Value Ref Range Status   Enterococcus species NOT DETECTED NOT DETECTED Final   Listeria monocytogenes NOT DETECTED NOT DETECTED Final   Staphylococcus species NOT DETECTED NOT DETECTED Final   Staphylococcus aureus (BCID) NOT DETECTED NOT DETECTED Final   Streptococcus species NOT DETECTED NOT DETECTED Final   Streptococcus agalactiae NOT DETECTED NOT DETECTED Final   Streptococcus pneumoniae NOT DETECTED NOT DETECTED Final   Streptococcus pyogenes NOT DETECTED NOT DETECTED Final   Acinetobacter baumannii NOT DETECTED NOT DETECTED Final   Enterobacteriaceae species DETECTED (A) NOT DETECTED Final    Comment: Enterobacteriaceae represent a large family of gram-negative  bacteria, not a single organism. CRITICAL RESULT CALLED TO, READ BACK BY AND VERIFIED WITH: PHARMD E MARTIN [id number] AT 1250 BY CM    Enterobacter cloacae complex NOT DETECTED NOT DETECTED Final  Escherichia coli NOT DETECTED NOT DETECTED Final   Klebsiella oxytoca NOT DETECTED NOT DETECTED Final   Klebsiella pneumoniae DETECTED (A) NOT DETECTED Final    Comment: CRITICAL RESULT CALLED TO, READ BACK BY AND VERIFIED WITH: PHARMD E MARTIN [id number] AT 1250 BY CM    Proteus species NOT DETECTED NOT DETECTED Final   Serratia marcescens NOT DETECTED NOT DETECTED Final   Carbapenem resistance NOT DETECTED NOT DETECTED Final   Haemophilus influenzae NOT DETECTED NOT DETECTED Final   Neisseria meningitidis NOT DETECTED NOT DETECTED Final   Pseudomonas aeruginosa NOT DETECTED NOT DETECTED Final   Candida albicans NOT DETECTED NOT DETECTED Final   Candida glabrata NOT DETECTED NOT DETECTED Final   Candida krusei NOT DETECTED NOT DETECTED Final   Candida parapsilosis NOT DETECTED NOT DETECTED Final   Candida tropicalis NOT DETECTED NOT DETECTED Final    Comment: Performed at [facility], [address], [place], [place] [address]  Culture, blood (Routine X 2) w Reflex to ID Panel     Status: None (Preliminary result)   Collection Time: 08/20/19  1:19 PM   Specimen: BLOOD LEFT WRIST  Result Value Ref Range Status   Specimen Description BLOOD LEFT WRIST  Final   Special Requests   Final    BOTTLES DRAWN AEROBIC AND ANAEROBIC Blood Culture adequate volume   Culture  Setup Time   Final    GRAM NEGATIVE RODS IN BOTH AEROBIC AND ANAEROBIC BOTTLES CRITICAL RESULT CALLED TO, READ BACK BY AND VERIFIED WITH: PHARMD E MARTIN [id number] AT 1258 BY CM    Culture   Final    NO GROWTH 1 DAY Performed at [facility], [address] [place], [place] [address]    Report Status PENDING  Incomplete  Culture, Urine     Status: Abnormal   Collection Time: 08/20/19  1:57 PM   Specimen: Urine, Clean  Catch  Result Value Ref Range Status   Specimen Description URINE, CLEAN CATCH  Final   Special Requests NONE  Final   Culture (A)  Final    <10,000 COLONIES/mL INSIGNIFICANT GROWTH Performed at [facility], [address], [place], [place] [address]    Report Status 08/21/2019 FINAL  Final  SARS Coronavirus 2 by RT PCR (hospital order, performed in [facility] hospital lab) Nasopharyngeal Nasopharyngeal Swab     Status: None   Collection Time: 08/20/19  2:26 PM   Specimen: Nasopharyngeal Swab  Result Value Ref Range Status   SARS Coronavirus 2 NEGATIVE NEGATIVE Final    Comment: (NOTE) If result is NEGATIVE SARS-CoV-2 target nucleic acids are NOT DETECTED. The SARS-CoV-2 RNA is generally detectable in upper and lower  respiratory specimens during the acute phase of infection. The lowest  concentration of SARS-CoV-2 viral copies this assay can detect is 250  copies / mL. A negative result does not preclude SARS-CoV-2 infection  and should not be used as the sole basis for treatment or other  patient management decisions.  A negative result may occur with  improper specimen collection / handling, submission of specimen other  than nasopharyngeal swab, presence of viral mutation(s) within the  areas targeted by this assay, and inadequate number of viral copies  (<250 copies / mL). A negative result must be combined with clinical  observations, patient history, and epidemiological information. If result is POSITIVE SARS-CoV-2 target nucleic acids are DETECTED. The SARS-CoV-2 RNA is generally detectable in upper and lower  respiratory specimens dur ing  the acute phase of infection.  Positive  results are indicative of active infection with SARS-CoV-2.  Clinical  correlation with patient history and other diagnostic information is  necessary to determine patient infection status.  Positive results do  not rule out bacterial infection or co-infection with other viruses. If  result is PRESUMPTIVE POSTIVE SARS-CoV-2 nucleic acids MAY BE PRESENT.   A presumptive positive result was obtained on the submitted specimen  and confirmed on repeat testing.  While 2019 novel coronavirus  (SARS-CoV-2) nucleic acids may be present in the submitted sample  additional confirmatory testing may be necessary for epidemiological  and / or clinical management purposes  to differentiate between  SARS-CoV-2 and other Sarbecovirus currently known to infect humans.  If clinically indicated additional testing with an alternate test  methodology [phone]) is advised. The SARS-CoV-2 RNA is generally  detectable in upper and lower respiratory sp ecimens during the acute  phase of infection. The expected result is Negative. Fact Sheet for Patients:  BoilerBrush.com.cy Fact Sheet for Healthcare Providers: https://pope.com/ This test is not yet approved or cleared by the [place] FDA and has been authorized for detection and/or diagnosis of SARS-CoV-2 by FDA under an Emergency Use Authorization (EUA).  This EUA will remain in effect (meaning this test can be used) for the duration of the COVID-19 declaration under Section 564(b)(1) of the Act, 21 U.S.C. section 360bbb-3(b)(1), unless the authorization is terminated or revoked sooner. Performed at [facility], [address], [place], [place] [address]   Surgical pcr screen     Status: Abnormal   Collection Time: 08/21/19  4:39 PM   Specimen: Nasal Mucosa; Nasal Swab  Result Value Ref Range Status   MRSA, PCR NEGATIVE NEGATIVE Final   Staphylococcus aureus POSITIVE (A) NEGATIVE Final    Comment: (NOTE) The Xpert SA Assay (FDA approved for NASAL specimens in patients 67 years of age and older), is one component of a comprehensive surveillance program. It is not intended to diagnose infection nor to guide or monitor treatment. Performed at [facility], [address] [place], [place] [address]          Radiology Studies: Dg Ercp Biliary & Pancreatic Ducts  Result Date: 08/21/2019 CLINICAL DATA:  Choledocholithiasis. EXAM: ERCP TECHNIQUE: Multiple spot images obtained with the fluoroscopic device and submitted for interpretation post-procedure. FLUOROSCOPY TIME:  Fluoroscopy Time:  2 minutes and 40 seconds Number of Acquired Spot Images: 3 COMPARISON:  MRCP 08/19/2019 FINDINGS: Retrograde cholangiogram raises concern for a filling defect in the distal common bile duct that would correspond with the previous MRCP. Questionable filling defect near the proximal common hepatic duct. Wire was advanced into the biliary system. No definite filling defects or stones on the final cholangiogram. IMPRESSION: Choledocholithiasis. These images were submitted for radiologic interpretation only. Please see the procedural report for the amount of contrast and the fluoroscopy time utilized. Electronically Signed   By: Richarda Overlie M.D.   On: 08/21/2019 09:19   Mr Abdomen Mrcp Vivien Rossetti Contast  Result Date: 08/20/2019 CLINICAL DATA:  Upper abdominal pain, abnormal LFTs EXAM: MRI ABDOMEN WITHOUT AND WITH CONTRAST (INCLUDING MRCP) TECHNIQUE: Multiplanar multisequence MR imaging of the abdomen was performed both before and after the administration of intravenous contrast. Heavily T2-weighted images of the biliary and pancreatic ducts were obtained, and three-dimensional MRCP images were rendered by post processing. CONTRAST:  84mL GADAVIST GADOBUTROL 1 MMOL/ML IV SOLN COMPARISON:  CTA abdomen/pelvis dated 08/15/2019. Right upper quadrant  ultrasound dated 08/17/2019. FINDINGS: Lower chest: Lung bases are clear. Hepatobiliary: No morphologic findings of cirrhosis. Geographic altered perfusion along the gallbladder fossa. No suspicious/enhancing hepatic lesions. Hepatic steatosis. Cholelithiasis gallbladder wall thickening and pericholecystic inflammatory changes. No intrahepatic ductal  dilatation. Common duct measures 7 mm proximally and 10 mm distally. Associated 8 mm distal CBD stone at the ampulla (series 14/image 9). Pancreas:  Within normal limits. Spleen:  Within normal limits. Adrenals/Urinary Tract:  Adrenal glands are within normal limits. Right kidney is within normal limits. Status post left nephrectomy. Stomach/Bowel: Stomach is within normal limits. Visualized bowel is notable for scattered colonic diverticulosis. Vascular/Lymphatic:  No evidence of abdominal aortic aneurysm. No suspicious abdominal lymphadenopathy. Other:  No abdominal ascites. Musculoskeletal: No focal osseous lesions. IMPRESSION: Cholelithiasis with findings suspicious for acute cholecystitis. Dilated common duct, measuring up to 10 mm distally, with associated 8 mm distal CBD stone at the ampulla. These results will be called to the ordering clinician or representative by the Radiologist Assistant, and communication documented in the PACS or zVision Dashboard. Electronically Signed   By: Charline BillsSriyesh  Krishnan M.D.   On: 08/20/2019 09:01        Scheduled Meds:  insulin aspart  0-5 Units Subcutaneous QHS   [START ON 08/22/2019] insulin aspart  0-9 Units Subcutaneous TID WC   Continuous Infusions:  sodium chloride     cefTRIAXone (ROCEPHIN)  IV 2 g (08/21/19 1634)   metronidazole 500 mg (08/21/19 1435)     LOS: 3 days    Time spent: 35 minutes    Alberteen Samhristopher P Cate Oravec, MD [facility] 08/21/2019, 6:52 PM     Please page through AMION:  [email or web address] Password [email or web address] If 7PM-7AM, please contact night-coverage

## 2019-08-21 NOTE — Progress Notes (Signed)
1/4 K PNA - KPC neg  [place] - BLOOD CULTURE IDENTIFICATION (BCID)  Joseph Orr is an 68 y.o. male who presented to [facility] on 08/18/2019 with a chief complaint of R-sided abdominal pain  Assessment:  76 YOM on antibiotics for cholelithiasis now s/p ERCP with CBD cleared - planning lap-chole on 10/19 per surgery. 3 of 4 blood cultures are now growing GNR with BCID detecting Klebsiella PNA.  Name of physician (or Provider) Contacted: [place]  Current antibiotics: Zosyn   Changes to prescribed antibiotics recommended:  D/c Zosyn and change to Rocephin 2g IV every 24 hours - MD wants to keep anaerobic coverage while awaiting lap-chole, so will add Flagyl 500 mg IV every 8 hours.   Results for orders placed or performed during the hospital encounter of 08/18/19  Blood Culture ID Panel (Reflexed) (Collected: 08/20/2019  1:15 PM)  Result Value Ref Range   Enterococcus species NOT DETECTED NOT DETECTED   Listeria monocytogenes NOT DETECTED NOT DETECTED   Staphylococcus species NOT DETECTED NOT DETECTED   Staphylococcus aureus (BCID) NOT DETECTED NOT DETECTED   Streptococcus species NOT DETECTED NOT DETECTED   Streptococcus agalactiae NOT DETECTED NOT DETECTED   Streptococcus pneumoniae NOT DETECTED NOT DETECTED   Streptococcus pyogenes NOT DETECTED NOT DETECTED   Acinetobacter baumannii NOT DETECTED NOT DETECTED   Enterobacteriaceae species DETECTED (A) NOT DETECTED   Enterobacter cloacae complex NOT DETECTED NOT DETECTED   Escherichia coli NOT DETECTED NOT DETECTED   Klebsiella oxytoca NOT DETECTED NOT DETECTED   Klebsiella pneumoniae DETECTED (A) NOT DETECTED   Proteus species NOT DETECTED NOT DETECTED   Serratia marcescens NOT DETECTED NOT DETECTED   Carbapenem resistance NOT DETECTED NOT DETECTED   Haemophilus influenzae NOT DETECTED NOT DETECTED   Neisseria meningitidis NOT DETECTED NOT DETECTED   Pseudomonas aeruginosa  NOT DETECTED NOT DETECTED   Candida albicans NOT DETECTED NOT DETECTED   Candida glabrata NOT DETECTED NOT DETECTED   Candida krusei NOT DETECTED NOT DETECTED   Candida parapsilosis NOT DETECTED NOT DETECTED   Candida tropicalis NOT DETECTED NOT DETECTED    Alycia Rossetti, PharmD, BCPS 1:06 PM

## 2019-08-21 NOTE — Anesthesia Procedure Notes (Signed)
Procedure Name: Intubation Date/Time: 08/21/2019 7:54 AM Performed by: Clearnce Sorrel, CRNA Pre-anesthesia Checklist: Patient identified, Emergency Drugs available, Suction available, Timeout performed and Patient being monitored Patient Re-evaluated:Patient Re-evaluated prior to induction Oxygen Delivery Method: Circle system utilized Preoxygenation: Pre-oxygenation with 100% oxygen Induction Type: IV induction, Rapid sequence and Cricoid Pressure applied Laryngoscope Size: Mac and 4 Grade View: Grade II Tube type: Oral Tube size: 7.5 mm Number of attempts: 1 Airway Equipment and Method: Stylet Placement Confirmation: ETT inserted through vocal cords under direct vision,  positive ETCO2 and breath sounds checked- equal and bilateral Secured at: 23 cm Tube secured with: Tape Dental Injury: Teeth and Oropharynx as per pre-operative assessment

## 2019-08-21 NOTE — Anesthesia Postprocedure Evaluation (Signed)
Anesthesia Post Note  Patient: Joseph Orr  Procedure(s) Performed: ENDOSCOPIC RETROGRADE CHOLANGIOPANCREATOGRAPHY (ERCP) (N/A ) SPHINCTEROTOMY REMOVAL OF STONES     Patient location during evaluation: PACU Anesthesia Type: General Level of consciousness: awake Pain management: pain level controlled Vital Signs Assessment: post-procedure vital signs reviewed and stable Respiratory status: spontaneous breathing Cardiovascular status: stable Postop Assessment: no apparent nausea or vomiting Anesthetic complications: no    Last Vitals:  Vitals:   08/21/19 0905 08/21/19 0920  BP: 131/74 135/62  Pulse: (!) 106   Resp: 19 (!) 29  Temp:    SpO2: 100% 98%    Last Pain:  Vitals:   08/21/19 0920  TempSrc:   PainSc: 0-No pain                 Alyxander Kollmann

## 2019-08-21 NOTE — Plan of Care (Signed)
  Problem: Education: Goal: Knowledge of General Education information will improve Description: Including pain rating scale, medication(s)/side effects and non-pharmacologic comfort measures Outcome: Progressing   Problem: Activity: Goal: Risk for activity intolerance will decrease Outcome: Progressing   Problem: Pain Managment: Goal: General experience of comfort will improve Outcome: Progressing   

## 2019-08-21 NOTE — Progress Notes (Signed)
Day of Surgery   Subjective/Chief Complaint: Successful ERCP Pt doing well post proc   Objective: Vital signs in last 24 hours: Temp:  [98.2 F (36.8 C)-103.1 F (39.5 C)] 98.6 F (37 C) (10/18 0855) Pulse Rate:  [85-143] 106 (10/18 0905) Resp:  [18-29] 29 (10/18 0920) BP: (102-145)/(52-83) 135/62 (10/18 0920) SpO2:  [95 %-100 %] 98 % (10/18 0920) Weight:  [87.1 kg] 87.1 kg (10/18 0710) Last BM Date: 08/19/19  Intake/Output from previous day: 10/17 0701 - 10/18 0700 In: 4032.8 [P.O.:360; I.V.:3567.4; IV Piggyback:105.3] Out: 700 [Urine:700] Intake/Output this shift: Total I/O In: 400 [I.V.:400] Out: 1 [Blood:1]  Constitutional: No acute distress, conversant, appears states age. Eyes: Anicteric sclerae, moist conjunctiva, no lid lag Lungs: Clear to auscultation bilaterally, normal respiratory effort CV: regular rate and rhythm, no murmurs, no peripheral edema, pedal pulses 2+ GI: Soft, no masses or hepatosplenomegaly, non-tender to palpation Skin: No rashes, palpation reveals normal turgor Psychiatric: appropriate judgment and insight, oriented to person, place, and time   Lab Results:  Recent Labs    08/20/19 1047 08/20/19 2119  WBC 7.6 8.1  HGB 13.0 11.1*  HCT 35.7* 32.6*  PLT 139* PLATELET CLUMPS NOTED ON SMEAR, UNABLE TO ESTIMATE   BMET Recent Labs    08/19/19 0500 08/20/19 1047  NA 123* 131*  K 3.4* 3.7  CL 89* 97*  CO2 22 17*  GLUCOSE 96 74  BUN 13 13  CREATININE 0.98 1.46*  CALCIUM 8.1* 8.0*   PT/INR Recent Labs    08/20/19 1321  LABPROT 17.6*  INR 1.5*   ABG No results for input(s): PHART, HCO3 in the last 72 hours.  Invalid input(s): PCO2, PO2  Studies/Results: Ct Angio Head W Or Wo Contrast  Result Date: 08/19/2019 CLINICAL DATA:  Focal neurological deficit EXAM: CT ANGIOGRAPHY HEAD AND NECK TECHNIQUE: Multidetector CT imaging of the head and neck was performed using the standard protocol during bolus administration of intravenous  contrast. Multiplanar CT image reconstructions and MIPs were obtained to evaluate the vascular anatomy. Carotid stenosis measurements (when applicable) are obtained utilizing NASCET criteria, using the distal internal carotid diameter as the denominator. CONTRAST:  OMNIPAQUE IOHEXOL 350 MG/ML SOLN COMPARISON:  MRI 03/15/2019 FINDINGS: CT HEAD FINDINGS Brain: Generalized atrophy. No focal abnormality seen affecting the brainstem or cerebellum. Cerebral hemispheres show an old infarction of the left thalamus no other focal finding. No mass lesion, hemorrhage, hydrocephalus or extra-axial collection. Vascular: There is atherosclerotic calcification of the major vessels at the base of the brain. Skull: Negative Sinuses: Left maxillary sinus inflammatory changes. Other sinuses clear. Orbits: Negative Review of the MIP images confirms the above findings CTA NECK FINDINGS Aortic arch: Aortic atherosclerosis. No aneurysm or dissection. Branching pattern is normal without origin stenosis. Right carotid system: Common carotid artery is widely patent to the bifurcation region. There is calcified plaque at the carotid bifurcation and ICA bulb. Minimal diameter is 4.5 mm, the same as the more distal cervical ICA, therefore no stenosis. Left carotid system: Common carotid artery widely patent to the bifurcation region. Calcified plaque at the carotid bifurcation and ICA bulb. Minimal diameter in the ICA bulb measures 2.7 mm. Compared to a more distal cervical ICA diameter 4.4 mm, this indicates a 40% stenosis. Vertebral arteries: Calcified plaque at both vertebral artery origins. No stenosis on the right. 30% stenosis on the left. Beyond that, both vertebral arteries are widely patent through the cervical region to foramen magnum. Skeleton: Mild cervical spondylosis. Other neck: No mass or  lymphadenopathy. Upper chest: Negative Review of the MIP images confirms the above findings CTA HEAD FINDINGS Anterior circulation: Both  internal carotid arteries are patent through the skull base and siphon regions. There is atherosclerotic calcification throughout the carotid siphons with stenosis estimated at 50% on each side. The anterior and middle cerebral vessels are patent without proximal stenosis, aneurysm or vascular malformation. No large or medium vessel occlusion identified. Posterior circulation: Both vertebral arteries are patent through the foramen magnum to the basilar. Atherosclerotic irregularity of the V4 segments but no stenosis greater than 30%. No basilar stenosis. Superior cerebellar and posterior cerebral arteries show flow on both sides. Venous sinuses: Patent and normal. Anatomic variants: None significant. Review of the MIP images confirms the above findings IMPRESSION: 1. Aortic atherosclerosis. No aneurysm or dissection. 2. Atherosclerotic disease at both carotid bifurcations but without measurable stenosis on the right. 40% stenosis of the ICA bulb on the left. 3. Atherosclerotic disease throughout the carotid siphons with stenosis estimated at 50% on each side. 4. No intracranial large or medium vessel occlusion or correctable proximal stenosis. 5. 30% stenosis of the left vertebral artery origin. Aortic Atherosclerosis (ICD10-I70.0). Electronically Signed   By: Paulina Fusi M.D.   On: 08/19/2019 14:42   Ct Angio Neck W Or Wo Contrast  Result Date: 08/19/2019 CLINICAL DATA:  Focal neurological deficit EXAM: CT ANGIOGRAPHY HEAD AND NECK TECHNIQUE: Multidetector CT imaging of the head and neck was performed using the standard protocol during bolus administration of intravenous contrast. Multiplanar CT image reconstructions and MIPs were obtained to evaluate the vascular anatomy. Carotid stenosis measurements (when applicable) are obtained utilizing NASCET criteria, using the distal internal carotid diameter as the denominator. CONTRAST:  OMNIPAQUE IOHEXOL 350 MG/ML SOLN COMPARISON:  MRI 03/15/2019 FINDINGS:  CT HEAD FINDINGS Brain: Generalized atrophy. No focal abnormality seen affecting the brainstem or cerebellum. Cerebral hemispheres show an old infarction of the left thalamus no other focal finding. No mass lesion, hemorrhage, hydrocephalus or extra-axial collection. Vascular: There is atherosclerotic calcification of the major vessels at the base of the brain. Skull: Negative Sinuses: Left maxillary sinus inflammatory changes. Other sinuses clear. Orbits: Negative Review of the MIP images confirms the above findings CTA NECK FINDINGS Aortic arch: Aortic atherosclerosis. No aneurysm or dissection. Branching pattern is normal without origin stenosis. Right carotid system: Common carotid artery is widely patent to the bifurcation region. There is calcified plaque at the carotid bifurcation and ICA bulb. Minimal diameter is 4.5 mm, the same as the more distal cervical ICA, therefore no stenosis. Left carotid system: Common carotid artery widely patent to the bifurcation region. Calcified plaque at the carotid bifurcation and ICA bulb. Minimal diameter in the ICA bulb measures 2.7 mm. Compared to a more distal cervical ICA diameter 4.4 mm, this indicates a 40% stenosis. Vertebral arteries: Calcified plaque at both vertebral artery origins. No stenosis on the right. 30% stenosis on the left. Beyond that, both vertebral arteries are widely patent through the cervical region to foramen magnum. Skeleton: Mild cervical spondylosis. Other neck: No mass or lymphadenopathy. Upper chest: Negative Review of the MIP images confirms the above findings CTA HEAD FINDINGS Anterior circulation: Both internal carotid arteries are patent through the skull base and siphon regions. There is atherosclerotic calcification throughout the carotid siphons with stenosis estimated at 50% on each side. The anterior and middle cerebral vessels are patent without proximal stenosis, aneurysm or vascular malformation. No large or medium vessel  occlusion identified. Posterior circulation: Both vertebral arteries are  patent through the foramen magnum to the basilar. Atherosclerotic irregularity of the V4 segments but no stenosis greater than 30%. No basilar stenosis. Superior cerebellar and posterior cerebral arteries show flow on both sides. Venous sinuses: Patent and normal. Anatomic variants: None significant. Review of the MIP images confirms the above findings IMPRESSION: 1. Aortic atherosclerosis. No aneurysm or dissection. 2. Atherosclerotic disease at both carotid bifurcations but without measurable stenosis on the right. 40% stenosis of the ICA bulb on the left. 3. Atherosclerotic disease throughout the carotid siphons with stenosis estimated at 50% on each side. 4. No intracranial large or medium vessel occlusion or correctable proximal stenosis. 5. 30% stenosis of the left vertebral artery origin. Aortic Atherosclerosis (ICD10-I70.0). Electronically Signed   By: Nelson Chimes M.D.   On: 08/19/2019 14:42   Dg Ercp Biliary & Pancreatic Ducts  Result Date: 08/21/2019 CLINICAL DATA:  Choledocholithiasis. EXAM: ERCP TECHNIQUE: Multiple spot images obtained with the fluoroscopic device and submitted for interpretation post-procedure. FLUOROSCOPY TIME:  Fluoroscopy Time:  2 minutes and 40 seconds Number of Acquired Spot Images: 3 COMPARISON:  MRCP 08/19/2019 FINDINGS: Retrograde cholangiogram raises concern for a filling defect in the distal common bile duct that would correspond with the previous MRCP. Questionable filling defect near the proximal common hepatic duct. Wire was advanced into the biliary system. No definite filling defects or stones on the final cholangiogram. IMPRESSION: Choledocholithiasis. These images were submitted for radiologic interpretation only. Please see the procedural report for the amount of contrast and the fluoroscopy time utilized. Electronically Signed   By: Markus Daft M.D.   On: 08/21/2019 09:19   Mr Abdomen Mrcp  Moise Boring Contast  Result Date: 08/20/2019 CLINICAL DATA:  Upper abdominal pain, abnormal LFTs EXAM: MRI ABDOMEN WITHOUT AND WITH CONTRAST (INCLUDING MRCP) TECHNIQUE: Multiplanar multisequence MR imaging of the abdomen was performed both before and after the administration of intravenous contrast. Heavily T2-weighted images of the biliary and pancreatic ducts were obtained, and three-dimensional MRCP images were rendered by post processing. CONTRAST:  71mL GADAVIST GADOBUTROL 1 MMOL/ML IV SOLN COMPARISON:  CTA abdomen/pelvis dated 08/15/2019. Right upper quadrant ultrasound dated 08/17/2019. FINDINGS: Lower chest: Lung bases are clear. Hepatobiliary: No morphologic findings of cirrhosis. Geographic altered perfusion along the gallbladder fossa. No suspicious/enhancing hepatic lesions. Hepatic steatosis. Cholelithiasis gallbladder wall thickening and pericholecystic inflammatory changes. No intrahepatic ductal dilatation. Common duct measures 7 mm proximally and 10 mm distally. Associated 8 mm distal CBD stone at the ampulla (series 14/image 9). Pancreas:  Within normal limits. Spleen:  Within normal limits. Adrenals/Urinary Tract:  Adrenal glands are within normal limits. Right kidney is within normal limits. Status post left nephrectomy. Stomach/Bowel: Stomach is within normal limits. Visualized bowel is notable for scattered colonic diverticulosis. Vascular/Lymphatic:  No evidence of abdominal aortic aneurysm. No suspicious abdominal lymphadenopathy. Other:  No abdominal ascites. Musculoskeletal: No focal osseous lesions. IMPRESSION: Cholelithiasis with findings suspicious for acute cholecystitis. Dilated common duct, measuring up to 10 mm distally, with associated 8 mm distal CBD stone at the ampulla. These results will be called to the ordering clinician or representative by the Radiologist Assistant, and communication documented in the PACS or zVision Dashboard. Electronically Signed   By: Julian Hy M.D.    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  VTE: SCD's, per medicine but okay for lovenox form our standpoint ID: Rocephin 10/17>> Foley: none Follow up: TBD   LOS: 3 days    Axel Fillerrmando Climmie Cronce 08/21/2019

## 2019-08-21 NOTE — Interval H&P Note (Signed)
History and Physical Interval Note:  08/21/2019 7:32 AM  Joseph Orr Joseph Orr  has presented today for surgery, with the diagnosis of Cholangitis.  The various methods of treatment have been discussed with the patient and family. After consideration of risks, benefits and other options for treatment, the patient has consented to  Procedure(s): ENDOSCOPIC RETROGRADE CHOLANGIOPANCREATOGRAPHY (ERCP) (N/A) as a surgical intervention.  The patient's history has been reviewed, patient examined, no change in status, stable for surgery.  I have reviewed the patient's chart and labs.  Questions were answered to the patient's satisfaction.     Bowe Sidor D

## 2019-08-21 NOTE — Anesthesia Preprocedure Evaluation (Addendum)
Anesthesia Evaluation  Patient identified by MRN, date of birth, ID band Patient awake    Reviewed: Allergy & Precautions, NPO status , Patient's Chart, lab work & pertinent test results  Airway Mallampati: II  TM Distance: >3 FB     Dental   Pulmonary COPD, former smoker,    breath sounds clear to auscultation       Cardiovascular  Rhythm:Regular Rate:Normal     Neuro/Psych    GI/Hepatic GERD  ,History noted. CG   Endo/Other    Renal/GU      Musculoskeletal   Abdominal   Peds  Hematology   Anesthesia Other Findings   Reproductive/Obstetrics                            Anesthesia Physical Anesthesia Plan  ASA: III  Anesthesia Plan: General   Post-op Pain Management:    Induction: Intravenous  PONV Risk Score and Plan: 2 and Ondansetron and Dexamethasone  Airway Management Planned: Oral ETT  Additional Equipment:   Intra-op Plan:   Post-operative Plan: Possible Post-op intubation/ventilation  Informed Consent: I have reviewed the patients History and Physical, chart, labs and discussed the procedure including the risks, benefits and alternatives for the proposed anesthesia with the patient or authorized representative who has indicated his/her understanding and acceptance.     Dental advisory given  Plan Discussed with: CRNA and Anesthesiologist  Anesthesia Plan Comments:         Anesthesia Quick Evaluation

## 2019-08-21 NOTE — Op Note (Signed)
[facility] Patient Name: Joseph Orr Procedure Date : 08/21/2019 MRN: [id number] Attending MD: Carol Ada , MD Date of Birth: Aug 03, 1951 CSN: [id number] Age: 68 Admit Type: Inpatient Procedure:                ERCP Indications:              Common bile duct stone(s), For therapy of ascending                            cholangitis Providers:                Carol Ada, MD, Carlyn Reichert, RN, Lazaro Arms,                            Technician, William Dalton, Technician Referring MD:              Medicines:                General Anesthesia Complications:            No immediate complications. Estimated Blood Loss:     Estimated blood loss was minimal. Procedure:                Pre-Anesthesia Assessment:                           - Prior to the procedure, a History and Physical                            was performed, and patient medications and                            allergies were reviewed. The patient's tolerance of                            previous anesthesia was also reviewed. The risks                            and benefits of the procedure and the sedation                            options and risks were discussed with the patient.                            All questions were answered, and informed consent                            was obtained. Prior Anticoagulants: The patient has                            taken Plavix (clopidogrel), last dose was 4 days                            prior to procedure. ASA Grade Assessment: III - A  patient with severe systemic disease. After                            reviewing the risks and benefits, the patient was                            deemed in satisfactory condition to undergo the                            procedure.                           - Sedation was administered by an anesthesia                            professional. General anesthesia was attained.              After obtaining informed consent, the scope was                            passed under direct vision. Throughout the                            procedure, the patient's blood pressure, pulse, and                            oxygen saturations were monitored continuously. The                            [id number]-Q180V ([id number]) Olympus duodenoscope was                            introduced through the mouth, and used to inject                            contrast into and used to inject contrast into the                            bile duct. The ERCP was accomplished without                            difficulty. The patient tolerated the procedure                            well. Scope In: Scope Out: Findings:      The major papilla was normal. The bile duct was deeply cannulated with       the short-nosed traction sphincterotome. Contrast was injected. I       personally interpreted the bile duct images. There was brisk flow of       contrast through the ducts. Image quality was excellent. Contrast       extended to the hepatic ducts. The common bile duct contained one stone,       which was 7 mm in diameter. Pus was emerging from the major papilla. A       long 0.025  inch Al Pimple was passed into the biliary tree. A 12 mm       biliary sphincterotomy was made with a monofilament traction (standard)       sphincterotome using ERBE electrocautery. There was self limited oozing       from the sphincterotomy which did not require treatment. The biliary       tree was swept with a 12 mm balloon starting at the bifurcation. One       stone was removed. No stones remained. Pus was swept from the duct.      The initial view of the ampulla was positive for cholangitis as there       was pus and mucus extruding from the orifice. There was also a suspcion       of bleeding from the biliary tract. Cannulation of the ampulla and then       wire placement was successful into the right hepatic  ducts. Contrast       injection revealed a mildly dilated CBD was 8-9 mm and a mid CBD stone.       The stone was successfully extracted with the 12 mm balloon. The       occlusion cholangiogram was negative for any retained stones. The       contrast drinaed rapidly and there was a resolution of the pus drainage,       however, some bleeding was noted. With prolonged observation the       bleeding did arrest spontaneously. Impression:               - The major papilla appeared normal.                           - Pus was seen in the major papilla.                           - Choledocholithiasis was found. Complete removal                            was accomplished by biliary sphincterotomy and                            balloon extraction.                           - A biliary sphincterotomy was performed.                           - The biliary tree was swept and pus was found. Recommendation:           - Return patient to hospital ward for ongoing care.                           - NPO.                           - Zosyn (piperacillin tazobactam) 3.375 gm IV q 6                            hr.                           -  Lap chole per Surgery. Procedure Code(s):        --- Professional ---                           [phone] [id number], Endoscopic retrograde                            cholangiopancreatography (ERCP); with removal of                            calculi/debris from biliary/pancreatic duct(s)                           43262, Endoscopic retrograde                            cholangiopancreatography (ERCP); with                            sphincterotomy/papillotomy                           [phone] [id number], Endoscopic catheterization of the biliary                            ductal system, radiological supervision and                            interpretation Diagnosis Code(s):        --- Professional ---                           K80.30, Calculus of bile duct with cholangitis,                             unspecified, without obstruction                           K83.09, Other cholangitis                           K83.8, Other specified diseases of biliary tract CPT copyright 2019 [employer]. All rights reserved. The codes documented in this report are preliminary and upon coder review may  be revised to meet current compliance requirements. Jeani HawkingPatrick Clary Meeker, MD Jeani HawkingPatrick Ameilia Rattan, MD 08/21/2019 8:50:20 AM This report has been signed electronically. Number of Addenda: 0

## 2019-08-21 NOTE — Transfer of Care (Signed)
Immediate Anesthesia Transfer of Care Note  Patient: Joseph Orr  Procedure(s) Performed: ENDOSCOPIC RETROGRADE CHOLANGIOPANCREATOGRAPHY (ERCP) (N/A ) SPHINCTEROTOMY REMOVAL OF STONES  Patient Location: Endoscopy Unit  Anesthesia Type:General  Level of Consciousness: awake, alert  and oriented  Airway & Oxygen Therapy: Patient Spontanous Breathing and Patient connected to nasal cannula oxygen  Post-op Assessment: Report given to RN and Post -op Vital signs reviewed and stable  Post vital signs: Reviewed and stable  Last Vitals:  Vitals Value Taken Time  BP    Temp    Pulse    Resp    SpO2      Last Pain:  Vitals:   08/21/19 0710  TempSrc: Oral  PainSc: 0-No pain      Patients Stated Pain Goal: 2 (35/59/74 1638)  Complications: No apparent anesthesia complications

## 2019-08-22 ENCOUNTER — Encounter (HOSPITAL_COMMUNITY): Payer: Self-pay | Admitting: Certified Registered Nurse Anesthetist

## 2019-08-22 ENCOUNTER — Inpatient Hospital Stay (HOSPITAL_COMMUNITY): Payer: Medicare HMO | Admitting: Certified Registered Nurse Anesthetist

## 2019-08-22 ENCOUNTER — Encounter (HOSPITAL_COMMUNITY)
Admission: AD | Disposition: A | Discharge status: Home / Self Care | Payer: Self-pay | Source: Ambulatory Visit | Attending: Internal Medicine

## 2019-08-22 DIAGNOSIS — E871 Hypo-osmolality and hyponatremia: Secondary | ICD-10-CM

## 2019-08-22 DIAGNOSIS — N179 Acute kidney failure, unspecified: Secondary | ICD-10-CM

## 2019-08-22 HISTORY — PX: CHOLECYSTECTOMY: SHX55

## 2019-08-22 LAB — COMPREHENSIVE METABOLIC PANEL
ALT: 102 U/L — ABNORMAL HIGH (ref 0–44)
AST: 118 U/L — ABNORMAL HIGH (ref 15–41)
Albumin: 2 g/dL — ABNORMAL LOW (ref 3.5–5.0)
Alkaline Phosphatase: 124 U/L (ref 38–126)
Anion gap: 12 (ref 5–15)
BUN: 22 mg/dL (ref 8–23)
CO2: 19 mmol/L — ABNORMAL LOW (ref 22–32)
Calcium: 7.3 mg/dL — ABNORMAL LOW (ref 8.9–10.3)
Chloride: 100 mmol/L (ref 98–111)
Creatinine, Ser: 1.67 mg/dL — ABNORMAL HIGH (ref 0.61–1.24)
GFR calc Af Amer: 48 mL/min — ABNORMAL LOW (ref >60)
GFR calc non Af Amer: 41 mL/min — ABNORMAL LOW (ref >60)
Glucose, Bld: 145 mg/dL — ABNORMAL HIGH (ref 70–99)
Potassium: 3.7 mmol/L (ref 3.5–5.1)
Sodium: 131 mmol/L — ABNORMAL LOW (ref 135–145)
Total Bilirubin: 2.9 mg/dL — ABNORMAL HIGH (ref 0.3–1.2)
Total Protein: 4.8 g/dL — ABNORMAL LOW (ref 6.5–8.1)

## 2019-08-22 LAB — GLUCOSE, CAPILLARY
Glucose-Capillary: 137 mg/dL — ABNORMAL HIGH (ref 70–99)
Glucose-Capillary: 149 mg/dL — ABNORMAL HIGH (ref 70–99)
Glucose-Capillary: 142 mg/dL — ABNORMAL HIGH (ref 70–99)
Glucose-Capillary: 120 mg/dL — ABNORMAL HIGH (ref 70–99)

## 2019-08-22 LAB — CBC
HCT: 31.5 % — ABNORMAL LOW (ref 39.0–52.0)
Hemoglobin: 11.1 g/dL — ABNORMAL LOW (ref 13.0–17.0)
MCH: 34.4 pg — ABNORMAL HIGH (ref 26.0–34.0)
MCHC: 35.2 g/dL (ref 30.0–36.0)
MCV: 97.5 fL (ref 80.0–100.0)
Platelets: 111 10*3/uL — ABNORMAL LOW (ref 150–400)
RBC: 3.23 MIL/uL — ABNORMAL LOW (ref 4.22–5.81)
RDW: 11.5 % (ref 11.5–15.5)
WBC: 7.2 10*3/uL (ref 4.0–10.5)
nRBC: 0 % (ref 0.0–0.2)

## 2019-08-22 LAB — HEMOGLOBIN AND HEMATOCRIT, BLOOD
HCT: 27 % — ABNORMAL LOW (ref 39.0–52.0)
Hemoglobin: 9.6 g/dL — ABNORMAL LOW (ref 13.0–17.0)

## 2019-08-22 SURGERY — LAPAROSCOPIC CHOLECYSTECTOMY WITH INTRAOPERATIVE CHOLANGIOGRAM
Anesthesia: General | Site: Abdomen

## 2019-08-22 MED ORDER — ROCURONIUM BROMIDE 10 MG/ML (PF) SYRINGE
PREFILLED_SYRINGE | INTRAVENOUS | Status: DC | PRN
  Administered 2019-08-22: 50 mg via INTRAVENOUS
  Administered 2019-08-22: 10 mg via INTRAVENOUS
  Administered 2019-08-22: 20 mg via INTRAVENOUS

## 2019-08-22 MED ORDER — DEXAMETHASONE SODIUM PHOSPHATE 10 MG/ML IJ SOLN
INTRAMUSCULAR | Status: AC
  Filled 2019-08-22: qty 1

## 2019-08-22 MED ORDER — DEXAMETHASONE SODIUM PHOSPHATE 10 MG/ML IJ SOLN
INTRAMUSCULAR | Status: DC | PRN
  Administered 2019-08-22: 4 mg via INTRAVENOUS

## 2019-08-22 MED ORDER — FENTANYL CITRATE (PF) 100 MCG/2ML IJ SOLN
25.0000 ug | INTRAMUSCULAR | Status: DC | PRN
  Administered 2019-08-22 (×2): 50 ug via INTRAVENOUS

## 2019-08-22 MED ORDER — BUPIVACAINE-EPINEPHRINE 0.25% -1:200000 IJ SOLN
INTRAMUSCULAR | Status: DC | PRN
  Administered 2019-08-22: 15 mL

## 2019-08-22 MED ORDER — ONDANSETRON HCL 4 MG/2ML IJ SOLN
INTRAMUSCULAR | Status: AC
  Filled 2019-08-22: qty 2

## 2019-08-22 MED ORDER — MIDAZOLAM HCL 2 MG/2ML IJ SOLN
INTRAMUSCULAR | Status: AC
  Filled 2019-08-22: qty 2

## 2019-08-22 MED ORDER — MUPIROCIN 2 % EX OINT
TOPICAL_OINTMENT | CUTANEOUS | Status: AC
  Administered 2019-08-22: 08:00:00
  Filled 2019-08-22: qty 22

## 2019-08-22 MED ORDER — PHENYLEPHRINE 40 MCG/ML (10ML) SYRINGE FOR IV PUSH (FOR BLOOD PRESSURE SUPPORT)
PREFILLED_SYRINGE | INTRAVENOUS | Status: AC
  Filled 2019-08-22: qty 10

## 2019-08-22 MED ORDER — ACETAMINOPHEN 325 MG PO TABS
650.0000 mg | ORAL_TABLET | Freq: Four times a day (QID) | ORAL | Status: DC
  Administered 2019-08-22 – 2019-08-25 (×10): 650 mg via ORAL
  Filled 2019-08-22 (×11): qty 2

## 2019-08-22 MED ORDER — ROCURONIUM BROMIDE 10 MG/ML (PF) SYRINGE
PREFILLED_SYRINGE | INTRAVENOUS | Status: AC
  Filled 2019-08-22: qty 10

## 2019-08-22 MED ORDER — PROPOFOL 10 MG/ML IV BOLUS
INTRAVENOUS | Status: DC | PRN
  Administered 2019-08-22: 150 mg via INTRAVENOUS

## 2019-08-22 MED ORDER — PHENYLEPHRINE 40 MCG/ML (10ML) SYRINGE FOR IV PUSH (FOR BLOOD PRESSURE SUPPORT)
PREFILLED_SYRINGE | INTRAVENOUS | Status: DC | PRN
  Administered 2019-08-22 (×2): 80 ug via INTRAVENOUS
  Administered 2019-08-22 (×2): 40 ug via INTRAVENOUS
  Administered 2019-08-22: 80 ug via INTRAVENOUS

## 2019-08-22 MED ORDER — LACTATED RINGERS IV SOLN
INTRAVENOUS | Status: DC
  Administered 2019-08-22 (×2): via INTRAVENOUS

## 2019-08-22 MED ORDER — FENTANYL CITRATE (PF) 100 MCG/2ML IJ SOLN
INTRAMUSCULAR | Status: AC
  Filled 2019-08-22: qty 2

## 2019-08-22 MED ORDER — FENTANYL CITRATE (PF) 250 MCG/5ML IJ SOLN
INTRAMUSCULAR | Status: DC | PRN
  Administered 2019-08-22 (×5): 50 ug via INTRAVENOUS

## 2019-08-22 MED ORDER — PROMETHAZINE HCL 25 MG/ML IJ SOLN: 6.2500 mg | INTRAMUSCULAR | Status: DC | PRN

## 2019-08-22 MED ORDER — HEMOSTATIC AGENTS (NO CHARGE) OPTIME
TOPICAL | Status: DC | PRN
  Administered 2019-08-22: 1 via TOPICAL

## 2019-08-22 MED ORDER — MIDAZOLAM HCL 5 MG/5ML IJ SOLN
INTRAMUSCULAR | Status: DC | PRN
  Administered 2019-08-22 (×2): 1 mg via INTRAVENOUS

## 2019-08-22 MED ORDER — BUPIVACAINE-EPINEPHRINE (PF) 0.25% -1:200000 IJ SOLN
INTRAMUSCULAR | Status: AC
  Filled 2019-08-22: qty 30

## 2019-08-22 MED ORDER — SODIUM CHLORIDE 0.9 % IR SOLN
Status: DC | PRN
  Administered 2019-08-22: 1000 mL

## 2019-08-22 MED ORDER — PROPOFOL 10 MG/ML IV BOLUS
INTRAVENOUS | Status: AC
  Filled 2019-08-22: qty 40

## 2019-08-22 MED ORDER — FENTANYL CITRATE (PF) 250 MCG/5ML IJ SOLN
INTRAMUSCULAR | Status: AC
  Filled 2019-08-22: qty 5

## 2019-08-22 MED ORDER — CHLORHEXIDINE GLUCONATE CLOTH 2 % EX PADS
6.0000 | MEDICATED_PAD | Freq: Every day | CUTANEOUS | Status: DC
  Administered 2019-08-23 – 2019-08-25 (×3): 6 via TOPICAL

## 2019-08-22 MED ORDER — MUPIROCIN 2 % EX OINT
1.0000 "application " | TOPICAL_OINTMENT | Freq: Two times a day (BID) | CUTANEOUS | Status: DC
  Administered 2019-08-22 – 2019-08-25 (×6): 1 via NASAL

## 2019-08-22 MED ORDER — OXYCODONE HCL 5 MG PO TABS
5.0000 mg | ORAL_TABLET | ORAL | Status: DC | PRN
  Administered 2019-08-22 (×2): 5 mg via ORAL
  Filled 2019-08-22 (×2): qty 1

## 2019-08-22 MED ORDER — ONDANSETRON HCL 4 MG/2ML IJ SOLN
INTRAMUSCULAR | Status: DC | PRN
  Administered 2019-08-22: 4 mg via INTRAVENOUS

## 2019-08-22 MED ORDER — ACETAMINOPHEN 325 MG PO TABS: 650.0000 mg | ORAL_TABLET | Freq: Four times a day (QID) | ORAL | Status: DC

## 2019-08-22 MED ORDER — 0.9 % SODIUM CHLORIDE (POUR BTL) OPTIME
TOPICAL | Status: DC | PRN
  Administered 2019-08-22: 1000 mL

## 2019-08-22 MED ORDER — ESMOLOL HCL 100 MG/10ML IV SOLN
INTRAVENOUS | Status: AC
  Filled 2019-08-22: qty 10

## 2019-08-22 MED ORDER — SODIUM CHLORIDE 0.9 % IV SOLN
INTRAVENOUS | Status: DC | PRN
  Administered 2019-08-22: 25 ug/min via INTRAVENOUS

## 2019-08-22 MED ORDER — SUGAMMADEX SODIUM 200 MG/2ML IV SOLN
INTRAVENOUS | Status: DC | PRN
  Administered 2019-08-22: 200 mg via INTRAVENOUS

## 2019-08-22 SURGICAL SUPPLY — 43 items
APPLIER CLIP 5 13 M/L LIGAMAX5 (MISCELLANEOUS) ×2
CANISTER SUCT 3000ML PPV (MISCELLANEOUS) ×2 IMPLANT
CHLORAPREP W/TINT 26 (MISCELLANEOUS) ×2 IMPLANT
CLIP APPLIE 5 13 M/L LIGAMAX5 (MISCELLANEOUS) ×1 IMPLANT
CONT SPEC 4OZ CLIKSEAL STRL BL (MISCELLANEOUS) ×2 IMPLANT
COVER MAYO STAND STRL (DRAPES) IMPLANT
COVER SURGICAL LIGHT HANDLE (MISCELLANEOUS) ×2 IMPLANT
COVER WAND RF STERILE (DRAPES) ×2 IMPLANT
CUTTER FLEX LINEAR 45M (STAPLE) ×2 IMPLANT
DERMABOND ADVANCED (GAUZE/BANDAGES/DRESSINGS) ×1
DERMABOND ADVANCED .7 DNX12 (GAUZE/BANDAGES/DRESSINGS) ×1 IMPLANT
DRAIN CHANNEL 19F RND (DRAIN) ×2 IMPLANT
DRAPE C-ARM 42X72 X-RAY (DRAPES) IMPLANT
ELECT REM PT RETURN 9FT ADLT (ELECTROSURGICAL) ×2
ELECTRODE REM PT RTRN 9FT ADLT (ELECTROSURGICAL) ×1 IMPLANT
EVACUATOR SILICONE 100CC (DRAIN) ×2 IMPLANT
GLOVE BIO SURGEON STRL SZ 6 (GLOVE) ×2 IMPLANT
GLOVE INDICATOR 6.5 STRL GRN (GLOVE) ×2 IMPLANT
GOWN STRL REUS W/ TWL LRG LVL3 (GOWN DISPOSABLE) ×3 IMPLANT
GOWN STRL REUS W/TWL LRG LVL3 (GOWN DISPOSABLE) ×3
GRASPER SUT TROCAR 14GX15 (MISCELLANEOUS) ×2 IMPLANT
HEMOSTAT SNOW SURGICEL 2X4 (HEMOSTASIS) ×2 IMPLANT
KIT BASIN OR (CUSTOM PROCEDURE TRAY) ×2 IMPLANT
KIT TURNOVER KIT B (KITS) ×2 IMPLANT
NEEDLE INSUFFLATION 14GA 120MM (NEEDLE) ×2 IMPLANT
NS IRRIG 1000ML POUR BTL (IV SOLUTION) ×2 IMPLANT
PAD ARMBOARD 7.5X6 YLW CONV (MISCELLANEOUS) ×2 IMPLANT
POUCH SPECIMEN RETRIEVAL 10MM (ENDOMECHANICALS) ×2 IMPLANT
RELOAD STAPLE TA45 3.5 REG BLU (ENDOMECHANICALS) ×2 IMPLANT
SCISSORS LAP 5X35 DISP (ENDOMECHANICALS) ×2 IMPLANT
SET CHOLANGIOGRAPH 5 50 .035 (SET/KITS/TRAYS/PACK) IMPLANT
SET IRRIG TUBING LAPAROSCOPIC (IRRIGATION / IRRIGATOR) ×4 IMPLANT
SET TUBE SMOKE EVAC HIGH FLOW (TUBING) ×2 IMPLANT
SLEEVE ENDOPATH XCEL 5M (ENDOMECHANICALS) ×4 IMPLANT
SUT ETHILON 2 0 FS 18 (SUTURE) ×2 IMPLANT
SUT MNCRL AB 4-0 PS2 18 (SUTURE) ×2 IMPLANT
TOWEL GREEN STERILE (TOWEL DISPOSABLE) ×2 IMPLANT
TOWEL GREEN STERILE FF (TOWEL DISPOSABLE) ×2 IMPLANT
TRAY LAPAROSCOPIC MC (CUSTOM PROCEDURE TRAY) ×2 IMPLANT
TROCAR XCEL 12X100 BLDLESS (ENDOMECHANICALS) ×2 IMPLANT
TROCAR XCEL NON-BLD 11X100MML (ENDOMECHANICALS) ×2 IMPLANT
TROCAR XCEL NON-BLD 5MMX100MML (ENDOMECHANICALS) ×2 IMPLANT
WATER STERILE IRR 1000ML POUR (IV SOLUTION) ×2 IMPLANT

## 2019-08-22 NOTE — Progress Notes (Signed)
IVT received consult for stat blood draw. Per last IVT note, midline does not draw blood. Primary care RN instructed to notify lab for blood draw.

## 2019-08-22 NOTE — Transfer of Care (Signed)
Immediate Anesthesia Transfer of Care Note  Patient: Joseph Orr  Procedure(s) Performed: LAPAROSCOPIC CHOLECYSTECTOMY (N/A Abdomen)  Patient Location: PACU  Anesthesia Type:General  Level of Consciousness: drowsy and patient cooperative  Airway & Oxygen Therapy: Patient Spontanous Breathing and Patient connected to face mask oxygen  Post-op Assessment: Report given to RN, Post -op Vital signs reviewed and stable and Patient moving all extremities X 4  Post vital signs: Reviewed and stable  Last Vitals:  Vitals Value Taken Time  BP 125/64 08/22/19 1208  Temp 36.3 C 08/22/19 1208  Pulse 88 08/22/19 1211  Resp 27 08/22/19 1211  SpO2 98 % 08/22/19 1211  Vitals shown include unvalidated device data.  Last Pain:  Vitals:   08/22/19 0505  TempSrc: Oral  PainSc:       Patients Stated Pain Goal: 2 (49/17/91 5056)  Complications: No apparent anesthesia complications

## 2019-08-22 NOTE — Op Note (Signed)
Operative Note  Joseph Orr 68 y.o. male [id number]  08/22/2019  Surgeon: Berna Bue MD FACS  Assistant: Barnetta Chapel, PA-C  Procedure performed: Laparoscopic Subtotal (stapled) Cholecystectomy  Procedure classification: urgent  Preop diagnosis: choledocholithiasis Post-op diagnosis/intraop findings: severe acute on chronic cholecystitis  Specimens: gallbladder  Retained items: 32fr round blake drain  EBL: 50cc  Complications: none  Description of procedure: After obtaining informed consent the patient was brought to the operating room. Antibiotics were administered. SCD's were applied. General endotracheal anesthesia was initiated and a formal time-out was performed. The abdomen was prepped and draped in the usual sterile fashion and the abdomen was entered using an infraumbilical veress needle after instilling the site with local. Insufflation to was obtained, 49mm trocar and camera inserted, and gross inspection revealed no evidence of injury from our entry or other intraabdominal abnormalities. Two 52mm trocars were introduced in the right midclavicular and right anterior axillary lines under direct visualization and following infiltration with local. An 30mm trocar was placed in the epigastrium.  The liver is small and nodular appearing.  The gallbladder is severely inflamed with erythematous thickened peritoneum, and encased in dense omental adhesions, with inflammation-induced tethering of the duodenum and porta towards the gallbladder.  The fundus of the gallbladder was grasped and gently retracted cephalad and blunt dissection was used to sweep omental adhesions down.  Tissue surrounding the gallbladder were friable and there was a fair amount of bleeding from this.  Careful blunt dissection and hook cautery were employed to attempt to dissect out the cystic duct and artery.  Hook cautery was used only on transparent fibers to reduce risk of injury to any  adjacent structures.  Due to significant dense inflammation in this area and concern of inflammatory adhesion of the common bile duct to the cystic duct and infundibulum, dissection was done very carefully and slowly. An artery coursing over the gallbladder body was clipped and divided, and some bleeding on the omental adhesions was addressed with clips as well. Could not safely progress further down towards the cystic duct.  At this point transitioned to a dome down approach and the gallbladder was carefully dissected from the liver parenchyma, again encountering friable tissue with a fair amount of bleeding.  The gallbladder tore and spilled a large amount of purulent fluid and small black stones which were aspirated.  Slowly proceeded until we had connected with the dissection performed at the beginning of the case, essentially at the level of the gallbladder neck and the gallbladder was connected to the patient with no other structures.   Gallbladder was transected with a 45 mm blue load stapler in an area that corresponds to the gallbladder neck, performing a subtotal cholecystectomy.  The corner of the staple line was reinforced with 2 clips.  The gallbladder was then placed in an Endo Catch bag and removed through the 12 mm trocar site.  The right upper quadrant was then irrigated and aspirated.  Bleeding on the liver bed was addressed with cautery, direct pressure, and Surgicel snow.  19 French round Blake drain was introduced through the right lateral trocar site and directed to sit along the liver bed and right upper quadrant.  This was secured to the skin with a 2-0 nylon.  The abdomen was once again surveyed and hemostasis confirmed.  The abdomen was desufflated, the trochars removed, and skin incisions closed with subcuticular Monocryl and Dermabond.  Patient is then awakened, extubated and taken to the recovery room in  stable condition.   All counts were correct at the completion of the case.

## 2019-08-22 NOTE — Anesthesia Procedure Notes (Signed)
Procedure Name: Intubation Date/Time: 08/22/2019 9:53 AM Performed by: Colin Benton, CRNA Pre-anesthesia Checklist: Patient identified, Emergency Drugs available, Suction available and Patient being monitored Patient Re-evaluated:Patient Re-evaluated prior to induction Oxygen Delivery Method: Circle system utilized Preoxygenation: Pre-oxygenation with 100% oxygen Induction Type: IV induction Ventilation: Mask ventilation without difficulty and Oral airway inserted - appropriate to patient size Laryngoscope Size: Sabra Heck and 2 Grade View: Grade II Tube type: Oral Tube size: 7.5 mm Number of attempts: 1 Airway Equipment and Method: Stylet Placement Confirmation: ETT inserted through vocal cords under direct vision,  positive ETCO2 and breath sounds checked- equal and bilateral Secured at: 22 cm Tube secured with: Tape

## 2019-08-22 NOTE — Anesthesia Preprocedure Evaluation (Signed)
Anesthesia Evaluation  Patient identified by MRN, date of birth, ID band Patient awake    Reviewed: Allergy & Precautions, NPO status , Patient's Chart, lab work & pertinent test results  Airway Mallampati: II  TM Distance: >3 FB     Dental   Pulmonary COPD, former smoker,    breath sounds clear to auscultation       Cardiovascular negative cardio ROS   Rhythm:Regular Rate:Normal     Neuro/Psych CVA, Residual Symptoms    GI/Hepatic Neg liver ROS, GERD  ,History noted. CG   Endo/Other  negative endocrine ROS  Renal/GU negative Renal ROS     Musculoskeletal  (+) Arthritis ,   Abdominal   Peds  Hematology negative hematology ROS (+)   Anesthesia Other Findings   Reproductive/Obstetrics                             Lab Results  Component Value Date   WBC 7.2 08/22/2019   HGB 11.1 (L) 08/22/2019   HCT 31.5 (L) 08/22/2019   MCV 97.5 08/22/2019   PLT 111 (L) 08/22/2019   Lab Results  Component Value Date   CREATININE 1.67 (H) 08/22/2019   BUN 22 08/22/2019   NA 131 (L) 08/22/2019   K 3.7 08/22/2019   CL 100 08/22/2019   CO2 19 (L) 08/22/2019    Anesthesia Physical  Anesthesia Plan  ASA: III  Anesthesia Plan: General   Post-op Pain Management:    Induction: Intravenous  PONV Risk Score and Plan: 2 and Ondansetron and Dexamethasone  Airway Management Planned: Oral ETT  Additional Equipment:   Intra-op Plan:   Post-operative Plan: Possible Post-op intubation/ventilation  Informed Consent: I have reviewed the patients History and Physical, chart, labs and discussed the procedure including the risks, benefits and alternatives for the proposed anesthesia with the patient or authorized representative who has indicated his/her understanding and acceptance.     Dental advisory given  Plan Discussed with: CRNA and Anesthesiologist  Anesthesia Plan Comments:          Anesthesia Quick Evaluation

## 2019-08-22 NOTE — Progress Notes (Signed)
[facility] Progress Note    Since last GI note: ERCP yesterday, see full report in Epic.  He's felt well since ERCP, tolerated liquid diet. No abd pains. Didn't sleep very well overnight duet o noise.  Objective: Vital signs in last 24 hours: Temp:  [97.7 F (36.5 C)-98.6 F (37 C)] 98.2 F (36.8 C) (10/19 0505) Pulse Rate:  [63-106] 63 (10/19 0505) Resp:  [14-29] 18 (10/19 0505) BP: (112-155)/(52-84) 136/74 (10/19 0505) SpO2:  [97 %-100 %] 99 % (10/19 0505) Weight:  [87.1 kg] 87.1 kg (10/18 0710) Last BM Date: 08/21/19(patient reported) General: alert and oriented times 3 Heart: regular rate and rythm Abdomen: soft, non-tender, non-distended, normal bowel sounds   Lab Results: Recent Labs    08/20/19 2119 08/21/19 1208 08/22/19 0424  WBC 8.1 6.8 7.2  HGB 11.1* 12.3* 11.1*  PLT PLATELET CLUMPS NOTED ON SMEAR, UNABLE TO ESTIMATE PLATELET CLUMPS NOTED ON SMEAR, UNABLE TO ESTIMATE 111*  MCV 98.8 99.2 97.5   Recent Labs    08/20/19 1047 08/21/19 1208 08/22/19 0424  NA 131* 131* 131*  K 3.7 3.4* 3.7  CL 97* 101 100  CO2 17* 17* 19*  GLUCOSE 74 220* 145*  BUN 13 18 22   CREATININE 1.46* 1.46* 1.67*  CALCIUM 8.0* 7.5* 7.3*   Recent Labs    08/20/19 1047 08/21/19 1208 08/22/19 0424  PROT 5.3* 5.3* 4.8*  ALBUMIN 2.5* 2.2* 2.0*  AST 408* 191* 118*  ALT 160* 124* 102*  ALKPHOS 217* 147* 124  BILITOT 6.5* 5.8* 2.9*   Recent Labs    08/20/19 1321  INR 1.5*    Medications: Scheduled Meds: . insulin aspart  0-5 Units Subcutaneous QHS  . insulin aspart  0-9 Units Subcutaneous TID WC   Continuous Infusions: . sodium chloride Stopped (08/22/19 0506)  . cefTRIAXone (ROCEPHIN)  IV Stopped (08/21/19 1704)  . metronidazole 100 mL/hr at 08/22/19 0600   PRN Meds:.acetaminophen **OR** acetaminophen, HYDROcodone-acetaminophen, morphine injection, ondansetron **OR** ondansetron (ZOFRAN) IV, sodium chloride flush, zolpidem    Assessment/Plan: 68 y.o.  male with gallstone disease  He tolerated the ERCP yesterday (sphincterotomy and stone removal) and is probably having his GB removed today with [facility].  No need for scheduled GI follow up.   Please call or page with any further questions or concerns (such as abnormal IOC).   Milus Banister, MD  08/22/2019, 7:06 AM [facility] Pager [phone]

## 2019-08-22 NOTE — Progress Notes (Signed)
PROGRESS NOTE    Joseph Orr  BDZ:[id number]  DOB: 1951/10/24  DOA: 08/18/2019 PCP: [place], Betty G, MD  Brief Narrative:  68 y.o.male, former smoker, h/o recent lacunar CVA in May ( asa plus plavix for 3 months, now plavix only), GERD, hyperlipidemia, and alcohol usewho was directly admitted for further management of right-sided abdominal pain. He initially presented to  ED 08/15/2019 with c/o chest/abdominal pain - labs notable for AST 75, ALT 35, alk phos 90, total bilirubin 1.9. Trop unremarkable. CTA chest/abdomen/pelvis dissection study was obtained which was negative for aortic dissection or aneurysm. Mild pericholecystic haziness with possible gallbladder sludge was noted. He was discharged home with recommendations for GI follow-up. Outpatient RUQ ultrasound 08/17/2019 showed minimal cholelithiasis with large amount of sludge, CBD diameter 8 mm. GI referred to Gen surgery. He was seen by Dr. Redmond Pulling with [facility] surgery who recommended admission to hospitalist service for inpatient cholecystectomy after neurological evaluation given recent stroke vs percutaneous drain by IR if felt to be high risk.  Hospital course: Seen by GI/GS- underwent ERCP 10/18 revealing pus in the ampulla, stones removed. HC course complicated by AKI/bacteremia.  Blood cultures growing Klebsiella, titrated antibiotics (zosyn-->rocephin/flagyl). Neurology cleared for intervention. Plan for lap cholecystectomy 10/19.  Subjective:  Patient underwent lap cholecystectomy today. Feels nauseous and c/o pain at sx site.  Objective: Vitals:   08/21/19 0920 08/21/19 1339 08/21/19 2138 08/22/19 0505  BP: 135/62 128/81 (!) 155/84 136/74  Pulse:  73 67 63  Resp: (!) 29 19 14 18   Temp:  98.1 F (36.7 C) 97.7 F (36.5 C) 98.2 F (36.8 C)  TempSrc:  Oral Oral Oral  SpO2: 98% 98% 100% 99%  Weight:      Height:        Intake/Output Summary (Last 24 hours) at 08/22/2019 0746 Last data filed  at 08/22/2019 0600 Gross per 24 hour  Intake 2042.19 ml  Output 1 ml  Net 2041.19 ml   Filed Weights   08/19/19 0112 08/21/19 0710  Weight: 87.1 kg 87.1 kg    Physical Examination:  General exam: Appears calm and comfortable  Respiratory system: Clear to auscultation. Respiratory effort normal. Cardiovascular system: S1 & S2 heard, RRR. No JVD, murmurs, rubs, gallops or clicks. No pedal edema. Gastrointestinal system: Abdomen is  mildly distended, tender at laparoscopic surgical sites as expected. No organomegaly or masses felt, bowel sounds heard. Central nervous system: somnolent from morphine/anesthesia. No new focal neurological deficits Skin: No rashes, lesions or ulcers Psychiatry:  Mood & affect anxious.     Data Reviewed: I have personally reviewed following labs and imaging studies  CBC: Recent Labs  Lab 08/18/19 2322 08/19/19 0500 08/20/19 1047 08/20/19 2119 08/21/19 1208 08/22/19 0424  WBC 12.4* 12.0* 7.6 8.1 6.8 7.2  NEUTROABS 9.8*  --   --  6.4  --   --   HGB 13.2 12.9* 13.0 11.1* 12.3* 11.1*  HCT 37.4* 36.5* 35.7* 32.6* 36.0* 31.5*  MCV 97.1 96.8 96.0 98.8 99.2 97.5  PLT 127* 126* 139* PLATELET CLUMPS NOTED ON SMEAR, UNABLE TO ESTIMATE PLATELET CLUMPS NOTED ON SMEAR, UNABLE TO ESTIMATE 341*   Basic Metabolic Panel: Recent Labs  Lab 08/18/19 2322 08/19/19 0500 08/20/19 1047 08/21/19 1208 08/22/19 0424  NA 123* 123* 131* 131* 131*  K 4.6 3.4* 3.7 3.4* 3.7  CL 87* 89* 97* 101 100  CO2 23 22 17* 17* 19*  GLUCOSE 95 96 74 220* 145*  BUN 15 13 13 18  22  CREATININE 1.09 0.98 1.46* 1.46* 1.67*  CALCIUM 8.7* 8.1* 8.0* 7.5* 7.3*  MG  --   --  1.5*  --   --    GFR: Estimated Creatinine Clearance: 46.3 mL/min (A) (by C-G formula based on SCr of 1.67 mg/dL (H)). Liver Function Tests: Recent Labs  Lab 08/18/19 2322 08/19/19 0500 08/20/19 1047 08/21/19 1208 08/22/19 0424  AST 37 32 408* 191* 118*  ALT 31 27 160* 124* 102*  ALKPHOS 68 63 217* 147*  124  BILITOT 3.2* 3.1* 6.5* 5.8* 2.9*  PROT 6.1* 5.7* 5.3* 5.3* 4.8*  ALBUMIN 3.0* 2.8* 2.5* 2.2* 2.0*   Recent Labs  Lab 08/15/19 1454 08/20/19 1321  LIPASE 32 18  AMYLASE  --  19*   No results for input(s): AMMONIA in the last 168 hours. Coagulation Profile: Recent Labs  Lab 08/20/19 1321  INR 1.5*   Cardiac Enzymes: No results for input(s): CKTOTAL, CKMB, CKMBINDEX, TROPONINI in the last 168 hours. BNP (last 3 results) No results for input(s): PROBNP in the last 8760 hours. HbA1C: No results for input(s): HGBA1C in the last 72 hours. CBG: Recent Labs  Lab 08/21/19 2236  GLUCAP 158*   Lipid Profile: No results for input(s): CHOL, HDL, LDLCALC, TRIG, CHOLHDL, LDLDIRECT in the last 72 hours. Thyroid Function Tests: No results for input(s): TSH, T4TOTAL, FREET4, T3FREE, THYROIDAB in the last 72 hours. Anemia Panel: No results for input(s): VITAMINB12, FOLATE, FERRITIN, TIBC, IRON, RETICCTPCT in the last 72 hours. Sepsis Labs: No results for input(s): PROCALCITON, LATICACIDVEN in the last 168 hours.  Recent Results (from the past 240 hour(s))  SARS CORONAVIRUS 2 (TAT 6-24 HRS) Nasopharyngeal Nasopharyngeal Swab     Status: None   Collection Time: 08/15/19  3:09 PM   Specimen: Nasopharyngeal Swab  Result Value Ref Range Status   SARS Coronavirus 2 NEGATIVE NEGATIVE Final    Comment: (NOTE) SARS-CoV-2 target nucleic acids are NOT DETECTED. The SARS-CoV-2 RNA is generally detectable in upper and lower respiratory specimens during the acute phase of infection. Negative results do not preclude SARS-CoV-2 infection, do not rule out co-infections with other pathogens, and should not be used as the sole basis for treatment or other patient management decisions. Negative results must be combined with clinical observations, patient history, and epidemiological information. The expected result is Negative. Fact Sheet for  Patients: SugarRoll.be Fact Sheet for Healthcare Providers: https://www.woods-mathews.com/ This test is not yet approved or cleared by the [place] FDA and  has been authorized for detection and/or diagnosis of SARS-CoV-2 by FDA under an Emergency Use Authorization (EUA). This EUA will remain  in effect (meaning this test can be used) for the duration of the COVID-19 declaration under Section 56 4(b)(1) of the Act, 21 U.S.C. section 360bbb-3(b)(1), unless the authorization is terminated or revoked sooner. Performed at [facility], [place] [address], [place], [place] [address]   Culture, blood (Routine X 2) w Reflex to ID Panel     Status: None (Preliminary result)   Collection Time: 08/20/19  1:15 PM   Specimen: BLOOD  Result Value Ref Range Status   Specimen Description BLOOD LEFT ANTECUBITAL  Final   Special Requests   Final    BOTTLES DRAWN AEROBIC AND ANAEROBIC Blood Culture adequate volume   Culture  Setup Time   Final    GRAM NEGATIVE RODS IN BOTH AEROBIC AND ANAEROBIC BOTTLES CRITICAL RESULT CALLED TO, READ BACK BY AND VERIFIED WITH: PHARMD E Orr [id number] AT 1250 BY CM Performed  at [facility], [place] [address], [place], [place] [address]    Culture GRAM NEGATIVE RODS  Final   Report Status PENDING  Incomplete  Blood Culture ID Panel (Reflexed)     Status: Abnormal   Collection Time: 08/20/19  1:15 PM  Result Value Ref Range Status   Enterococcus species NOT DETECTED NOT DETECTED Final   Listeria monocytogenes NOT DETECTED NOT DETECTED Final   Staphylococcus species NOT DETECTED NOT DETECTED Final   Staphylococcus aureus (BCID) NOT DETECTED NOT DETECTED Final   Streptococcus species NOT DETECTED NOT DETECTED Final   Streptococcus agalactiae NOT DETECTED NOT DETECTED Final   Streptococcus pneumoniae NOT DETECTED NOT DETECTED Final   Streptococcus pyogenes NOT DETECTED NOT DETECTED Final   Acinetobacter baumannii NOT  DETECTED NOT DETECTED Final   Enterobacteriaceae species DETECTED (A) NOT DETECTED Final    Comment: Enterobacteriaceae represent a large family of gram-negative bacteria, not a single organism. CRITICAL RESULT CALLED TO, READ BACK BY AND VERIFIED WITH: PHARMD E Orr [id number] AT 1250 BY CM    Enterobacter cloacae complex NOT DETECTED NOT DETECTED Final   Escherichia coli NOT DETECTED NOT DETECTED Final   Klebsiella oxytoca NOT DETECTED NOT DETECTED Final   Klebsiella pneumoniae DETECTED (A) NOT DETECTED Final    Comment: CRITICAL RESULT CALLED TO, READ BACK BY AND VERIFIED WITH: PHARMD E Orr [id number] AT 1250 BY CM    Proteus species NOT DETECTED NOT DETECTED Final   Serratia marcescens NOT DETECTED NOT DETECTED Final   Carbapenem resistance NOT DETECTED NOT DETECTED Final   Haemophilus influenzae NOT DETECTED NOT DETECTED Final   Neisseria meningitidis NOT DETECTED NOT DETECTED Final   Pseudomonas aeruginosa NOT DETECTED NOT DETECTED Final   Candida albicans NOT DETECTED NOT DETECTED Final   Candida glabrata NOT DETECTED NOT DETECTED Final   Candida krusei NOT DETECTED NOT DETECTED Final   Candida parapsilosis NOT DETECTED NOT DETECTED Final   Candida tropicalis NOT DETECTED NOT DETECTED Final    Comment: Performed at [facility], [address], [place], [place] [address]  Culture, blood (Routine X 2) w Reflex to ID Panel     Status: None (Preliminary result)   Collection Time: 08/20/19  1:19 PM   Specimen: BLOOD LEFT WRIST  Result Value Ref Range Status   Specimen Description BLOOD LEFT WRIST  Final   Special Requests   Final    BOTTLES DRAWN AEROBIC AND ANAEROBIC Blood Culture adequate volume   Culture  Setup Time   Final    GRAM NEGATIVE RODS IN BOTH AEROBIC AND ANAEROBIC BOTTLES CRITICAL RESULT CALLED TO, READ BACK BY AND VERIFIED WITH: PHARMD E Orr [id number] AT 1258 BY CM    Culture   Final    NO GROWTH 1 DAY Performed at [facility], [place] [address]  [place], [place] [address]    Report Status PENDING  Incomplete  Culture, Urine     Status: Abnormal   Collection Time: 08/20/19  1:57 PM   Specimen: Urine, Clean Catch  Result Value Ref Range Status   Specimen Description URINE, CLEAN CATCH  Final   Special Requests NONE  Final   Culture (A)  Final    <10,000 COLONIES/mL INSIGNIFICANT GROWTH Performed at [facility], [place] [address] [place], [place] [address]    Report Status 08/21/2019 FINAL  Final  SARS Coronavirus 2 by RT PCR (hospital order, performed in [facility] hospital lab) Nasopharyngeal Nasopharyngeal Swab  Status: None   Collection Time: 08/20/19  2:26 PM   Specimen: Nasopharyngeal Swab  Result Value Ref Range Status   SARS Coronavirus 2 NEGATIVE NEGATIVE Final    Comment: (NOTE) If result is NEGATIVE SARS-CoV-2 target nucleic acids are NOT DETECTED. The SARS-CoV-2 RNA is generally detectable in upper and lower  respiratory specimens during the acute phase of infection. The lowest  concentration of SARS-CoV-2 viral copies this assay can detect is 250  copies / mL. A negative result does not preclude SARS-CoV-2 infection  and should not be used as the sole basis for treatment or other  patient management decisions.  A negative result may occur with  improper specimen collection / handling, submission of specimen other  than nasopharyngeal swab, presence of viral mutation(s) within the  areas targeted by this assay, and inadequate number of viral copies  (<250 copies / mL). A negative result must be combined with clinical  observations, patient history, and epidemiological information. If result is POSITIVE SARS-CoV-2 target nucleic acids are DETECTED. The SARS-CoV-2 RNA is generally detectable in upper and lower  respiratory specimens dur ing the acute phase of infection.  Positive  results are indicative of active infection with SARS-CoV-2.  Clinical  correlation with patient history and other diagnostic  information is  necessary to determine patient infection status.  Positive results do  not rule out bacterial infection or co-infection with other viruses. If result is PRESUMPTIVE POSTIVE SARS-CoV-2 nucleic acids MAY BE PRESENT.   A presumptive positive result was obtained on the submitted specimen  and confirmed on repeat testing.  While 2019 novel coronavirus  (SARS-CoV-2) nucleic acids may be present in the submitted sample  additional confirmatory testing may be necessary for epidemiological  and / or clinical management purposes  to differentiate between  SARS-CoV-2 and other Sarbecovirus currently known to infect humans.  If clinically indicated additional testing with an alternate test  methodology [id number]) is advised. The SARS-CoV-2 RNA is generally  detectable in upper and lower respiratory sp ecimens during the acute  phase of infection. The expected result is Negative. Fact Sheet for Patients:  StrictlyIdeas.no Fact Sheet for Healthcare Providers: BankingDealers.co.za This test is not yet approved or cleared by the [place] FDA and has been authorized for detection and/or diagnosis of SARS-CoV-2 by FDA under an Emergency Use Authorization (EUA).  This EUA will remain in effect (meaning this test can be used) for the duration of the COVID-19 declaration under Section 564(b)(1) of the Act, 21 U.S.C. section 360bbb-3(b)(1), unless the authorization is terminated or revoked sooner. Performed at [facility], [place] [address] [place], [place] [address]   Surgical pcr screen     Status: Abnormal   Collection Time: 08/21/19  4:39 PM   Specimen: Nasal Mucosa; Nasal Swab  Result Value Ref Range Status   MRSA, PCR NEGATIVE NEGATIVE Final   Staphylococcus aureus POSITIVE (A) NEGATIVE Final    Comment: (NOTE) The Xpert SA Assay (FDA approved for NASAL specimens in patients 38 years of age and older), is one component of a  comprehensive surveillance program. It is not intended to diagnose infection nor to guide or monitor treatment. Performed at [facility], [place] [address] [place], [place] [address]       Radiology Studies: Dg Ercp Biliary & Pancreatic Ducts  Result Date: 08/21/2019 CLINICAL DATA:  Choledocholithiasis. EXAM: ERCP TECHNIQUE: Multiple spot images obtained with the fluoroscopic device and submitted for interpretation post-procedure. FLUOROSCOPY TIME:  Fluoroscopy Time:  2 minutes and 40 seconds Number of Acquired Spot Images: 3 COMPARISON:  MRCP 08/19/2019 FINDINGS: Retrograde cholangiogram raises concern for a filling defect in the distal common bile duct that would correspond with the previous MRCP. Questionable filling defect near the proximal common hepatic duct. Wire was advanced into the biliary system. No definite filling defects or stones on the final cholangiogram. IMPRESSION: Choledocholithiasis. These images were submitted for radiologic interpretation only. Please see the procedural report for the amount of contrast and the fluoroscopy time utilized. Electronically Signed   By: Markus Daft M.D.   On: 08/21/2019 09:19        Scheduled Meds:  insulin aspart  0-5 Units Subcutaneous QHS   insulin aspart  0-9 Units Subcutaneous TID WC   Continuous Infusions:  sodium chloride Stopped (08/22/19 0506)   cefTRIAXone (ROCEPHIN)  IV Stopped (08/21/19 1704)   metronidazole 100 mL/hr at 08/22/19 0600    Assessment & Plan:    1.  Sepsis due to acute cholecystitis/cholangitis with Klebsiella bacteremia: Patient underwent laparoscopic subtotal cholecystectomy by general surgery today with intraoperative findings of severe acute on chronic cholecystitis.  Operative note indicates bleeding intraoperatively (not sure if more than expected, EBL estimated to be 50 mL only).  Watch hemoglobin closely.  Currently on Rocephin/Flagyl.  Leukocytosis resolved (12k->7K) continue  analgesics/antiemetics as needed.  Watch for ileus noted that patient initiated on clear liquid diet postoperatively by GS.  If symptoms of nausea/vomiting worsen overnight, will change to n.p.o. status.  Hydrate for another day.  Total bili now down to 2.9 (peaked to 6.5 on 10/17).  2.  Recent NOI:BBCW was as an outpatient around May 1, had some right hand weakness. MRI obtained as outpatient showed lacunar stroke.  Did not follow up with Neurology. Had secondary stroke work up with echo, carotids, A1c, Lipids that was unremarkable. On Plavix since stroke (>3 months).  Neurology evaluated and cleared for surgery.  Will resume Plavix in a.m. if hemoglobin stable and cleared by surgery.  Resume statins in a.m. if LFTs continue to improve Lipid profile on May 4 reassuring with total cholesterol 149, LDL 70, HDL 63 and TG 80  3.  Hyponatremia: Sodium improved with IV fluids from 123--> 131 and has been stable over the last 2 to 3 days.  Serum osmolality low at 263 on 10/16.  Urine osmolality low at 270 and urine sodium less than 10 per labs on 10/16  4.  AKI: Due to ATN in the setting of problem #1 versus volume loss/dehydration.  Patient presented with labs showing creatinine around 1.0, creatinine went up to 1.4 yesterday and further up at 1.6 today.  Resume IV fluids.  Avoid nephrotoxic agents.  Will consult renal continues to worsen  5.?  Mild hyperglycemia: Patient had blood glucose of greater than 200 on 1 occasion.  Check hemoglobin A1c.  No known history of diabetes.    DVT prophylaxis: SCDs for now Code Status: Full code Family / Patient Communication: Discussed with patient and bedside nurse Disposition Plan: Home when medically cleared.     LOS: 4 days    Time spent:     Guilford Shi, MD [facility] Pager [phone]  If 7PM-7AM, please contact night-coverage [email or web address] Password [email or web address] 08/22/2019, 7:46 AM

## 2019-08-22 NOTE — Progress Notes (Signed)
Day of Surgery   Subjective/Chief Complaint: Denies abdominal pain   Objective: Vital signs in last 24 hours: Temp:  [97.7 F (36.5 C)-98.2 F (36.8 C)] 98.2 F (36.8 C) (10/19 0505) Pulse Rate:  [63-73] 63 (10/19 0505) Resp:  [14-19] 18 (10/19 0505) BP: (128-155)/(74-84) 136/74 (10/19 0505) SpO2:  [98 %-100 %] 99 % (10/19 0505) Weight:  [87.1 kg] 87.1 kg (10/19 0831) Last BM Date: 08/21/19(patient reported)  Intake/Output from previous day: 10/18 0701 - 10/19 0700 In: 2042.2 [P.O.:300; I.V.:1304.5; IV Piggyback:437.7] Out: 1 [Blood:1] Intake/Output this shift: No intake/output data recorded.  Alert and well appearing Abdomen soft, nontender   Lab Results:  Recent Labs    08/21/19 1208 08/22/19 0424  WBC 6.8 7.2  HGB 12.3* 11.1*  HCT 36.0* 31.5*  PLT PLATELET CLUMPS NOTED ON SMEAR, UNABLE TO ESTIMATE 111*   BMET Recent Labs    08/21/19 1208 08/22/19 0424  NA 131* 131*  K 3.4* 3.7  CL 101 100  CO2 17* 19*  GLUCOSE 220* 145*  BUN 18 22  CREATININE 1.46* 1.67*  CALCIUM 7.5* 7.3*   PT/INR Recent Labs    08/20/19 1321  LABPROT 17.6*  INR 1.5*   ABG No results for input(s): PHART, HCO3 in the last 72 hours.  Invalid input(s): PCO2, PO2  Studies/Results: Dg Ercp Biliary & Pancreatic Ducts  Result Date: 08/21/2019 CLINICAL DATA:  Choledocholithiasis. EXAM: ERCP TECHNIQUE: Multiple spot images obtained with the fluoroscopic device and submitted for interpretation post-procedure. FLUOROSCOPY TIME:  Fluoroscopy Time:  2 minutes and 40 seconds Number of Acquired Spot Images: 3 COMPARISON:  MRCP 08/19/2019 FINDINGS: Retrograde cholangiogram raises concern for a filling defect in the distal common bile duct that would correspond with the previous MRCP. Questionable filling defect near the proximal common hepatic duct. Wire was advanced into the biliary system. No definite filling defects or stones on the final cholangiogram. IMPRESSION: Choledocholithiasis.  These images were submitted for radiologic interpretation only. Please see the procedural report for the amount of contrast and the fluoroscopy time utilized. Electronically Signed   By: Markus Daft M.D.   On: 08/21/2019 09:19    Anti-infectives: Anti-infectives (From admission, onward)   Start     Dose/Rate Route Frequency Ordered Stop   08/21/19 1400  [MAR Hold]  cefTRIAXone (ROCEPHIN) 2 g in sodium chloride 0.9 % 100 mL IVPB     (MAR Hold since Mon 08/22/2019 at 0820.Hold Reason: Transfer to a Procedural area.)   2 g 200 mL/hr over 30 Minutes Intravenous Every 24 hours 08/21/19 1306     08/21/19 1400  [MAR Hold]  metroNIDAZOLE (FLAGYL) IVPB 500 mg     (MAR Hold since Mon 08/22/2019 at [facility].Hold Reason: Transfer to a Procedural area.)   500 mg 100 mL/hr over 60 Minutes Intravenous Every 8 hours 08/21/19 1310     08/20/19 1400  piperacillin-tazobactam (ZOSYN) IVPB 3.375 g  Status:  Discontinued     3.375 g 12.5 mL/hr over 240 Minutes Intravenous Every 8 hours 08/20/19 1217 08/21/19 1306      Assessment/Plan: GOUT GERD CVA 02/2019 - per neurology, okay for surgery and to hold plavix, resume plavix as soon as possible postop  Choledocholithiasis and likely cholecystitis -CBD cleared -I recommend proceeding with laparoscopic cholecystectomy with possible cholangiogram. Discussed risks of surgery including bleeding, pain, scarring, intraabdominal injury specifically to the common bile duct and sequelae, bile leak, conversion to open surgery, blood clot, pneumonia, heart attack, stroke, failure to resolve symptoms, etc. Questions welcomed and  answered. He agrees to proceed with surgery this morning. FEN: advance diet after OR  VTE: SCD's, per medicine but okay for lovenox form our standpoint ID: Rocephin 10/17>> Foley: none Follow up: TBD   LOS: 4 days    Berna Bue 08/22/2019

## 2019-08-23 ENCOUNTER — Encounter (HOSPITAL_COMMUNITY): Payer: Self-pay | Admitting: Surgery

## 2019-08-23 DIAGNOSIS — D62 Acute posthemorrhagic anemia: Secondary | ICD-10-CM

## 2019-08-23 LAB — CULTURE, BLOOD (ROUTINE X 2)
Special Requests: ADEQUATE
Special Requests: ADEQUATE

## 2019-08-23 LAB — COMPREHENSIVE METABOLIC PANEL
ALT: 80 U/L — ABNORMAL HIGH (ref 0–44)
AST: 69 U/L — ABNORMAL HIGH (ref 15–41)
Albumin: 2.1 g/dL — ABNORMAL LOW (ref 3.5–5.0)
Alkaline Phosphatase: 109 U/L (ref 38–126)
Anion gap: 11 (ref 5–15)
BUN: 19 mg/dL (ref 8–23)
CO2: 20 mmol/L — ABNORMAL LOW (ref 22–32)
Calcium: 7.5 mg/dL — ABNORMAL LOW (ref 8.9–10.3)
Chloride: 106 mmol/L (ref 98–111)
Creatinine, Ser: 1.18 mg/dL (ref 0.61–1.24)
GFR calc Af Amer: >60 mL/min (ref >60)
GFR calc non Af Amer: >60 mL/min (ref >60)
Glucose, Bld: 115 mg/dL — ABNORMAL HIGH (ref 70–99)
Potassium: 3.3 mmol/L — ABNORMAL LOW (ref 3.5–5.1)
Sodium: 137 mmol/L (ref 135–145)
Total Bilirubin: 1.7 mg/dL — ABNORMAL HIGH (ref 0.3–1.2)
Total Protein: 4.7 g/dL — ABNORMAL LOW (ref 6.5–8.1)

## 2019-08-23 LAB — CBC
HCT: 27.9 % — ABNORMAL LOW (ref 39.0–52.0)
Hemoglobin: 10 g/dL — ABNORMAL LOW (ref 13.0–17.0)
MCH: 34.6 pg — ABNORMAL HIGH (ref 26.0–34.0)
MCHC: 35.8 g/dL (ref 30.0–36.0)
MCV: 96.5 fL (ref 80.0–100.0)
Platelets: 136 10*3/uL — ABNORMAL LOW (ref 150–400)
RBC: 2.89 MIL/uL — ABNORMAL LOW (ref 4.22–5.81)
RDW: 11.9 % (ref 11.5–15.5)
WBC: 12.3 10*3/uL — ABNORMAL HIGH (ref 4.0–10.5)
nRBC: 0 % (ref 0.0–0.2)

## 2019-08-23 LAB — SURGICAL PATHOLOGY

## 2019-08-23 LAB — HEMOGLOBIN A1C
Hgb A1c MFr Bld: 4.7 % — ABNORMAL LOW (ref 4.8–5.6)
Mean Plasma Glucose: 88 mg/dL

## 2019-08-23 MED ORDER — POTASSIUM CHLORIDE 20 MEQ PO PACK
40.0000 meq | PACK | Freq: Once | ORAL | Status: AC
  Administered 2019-08-23: 40 meq via ORAL
  Filled 2019-08-23: qty 2

## 2019-08-23 NOTE — Anesthesia Postprocedure Evaluation (Signed)
Anesthesia Post Note  Patient: Lochlann Mastrangelo  Procedure(s) Performed: LAPAROSCOPIC CHOLECYSTECTOMY (N/A Abdomen)     Patient location during evaluation: PACU Anesthesia Type: General Level of consciousness: awake and alert Pain management: pain level controlled Vital Signs Assessment: post-procedure vital signs reviewed and stable Respiratory status: spontaneous breathing, nonlabored ventilation, respiratory function stable and patient connected to nasal cannula oxygen Cardiovascular status: blood pressure returned to baseline and stable Postop Assessment: no apparent nausea or vomiting Anesthetic complications: no    Last Vitals:  Vitals:   08/23/19 0437 08/23/19 0749  BP: 121/82 140/73  Pulse: 74 73  Resp: 18 20  Temp: 36.4 C 36.4 C  SpO2: 99% 99%    Last Pain:  Vitals:   08/23/19 0936  TempSrc:   PainSc: 0-No pain                 Tiajuana Amass

## 2019-08-23 NOTE — Progress Notes (Signed)
[facility]/Trauma Progress Note  1 Day Post-Op   Assessment/Plan GOUT GERD CVA 02/2019 - per neurology, okay for surgery and to hold plavix, resume plavix as soon as possible postop  Choledocholithiasis and cholecystitis - CBD cleared via ERCP 10/18 - s/p laparoscopic subtotal (stapled) cholecystectomy, Dr. Kae Heller, 10/19 - continue JP drain, recheck CBC in am  FEN:reg diet VTE: SCD's,hold lovenox for now SA:[id number] 10/17>> Foley:none Follow up:CCS 2 weeks  Plan: recheck labs in am, hold on lovenox for now. Possible discharge tomorrow?   LOS: 5 days    Subjective: CC: abdominal soreness  No issues overnight. Pt states much less pain today since surgery. He is feeling much better. He is eating breakfast. Having flatus and urinating without issues.   Objective: Vital signs in last 24 hours: Temp:  [97.3 F (36.3 C)-98.3 F (36.8 C)] 97.6 F (36.4 C) (10/20 0749) Pulse Rate:  [68-92] 73 (10/20 0749) Resp:  [13-20] 20 (10/20 0749) BP: (108-145)/(63-82) 140/73 (10/20 0749) SpO2:  [97 %-100 %] 99 % (10/20 0749) Last BM Date: 08/21/19  Intake/Output from previous day: 10/19 0701 - 10/20 0700 In: 2207.5 [P.O.:370; I.V.:1752; IV Piggyback:85.5] Out: 2130 [Urine:1925; Drains:175; Blood:30] Intake/Output this shift: Total I/O In: -  Out: 350 [Urine:350]  PE: Gen:  Alert, NAD, pleasant, cooperative Pulm:  Rate and effort normal Abd: Soft, ND, incisions with glue intact are well appearing, drain with sanguinous drainage, mild TTP RUQ Skin: no rashes noted, warm and dry   Anti-infectives: Anti-infectives (From admission, onward)   Start     Dose/Rate Route Frequency Ordered Stop   08/21/19 1400  cefTRIAXone (ROCEPHIN) 2 g in sodium chloride 0.9 % 100 mL IVPB     2 g 200 mL/hr over 30 Minutes Intravenous Every 24 hours 08/21/19 1306     08/21/19 1400  metroNIDAZOLE (FLAGYL) IVPB 500 mg     500 mg 100 mL/hr over 60 Minutes Intravenous Every 8  hours 08/21/19 1310     08/20/19 1400  piperacillin-tazobactam (ZOSYN) IVPB 3.375 g  Status:  Discontinued     3.375 g 12.5 mL/hr over 240 Minutes Intravenous Every 8 hours 08/20/19 1217 08/21/19 1306      Lab Results:  Recent Labs    08/22/19 0424 08/22/19 2009 08/23/19 0721  WBC 7.2  --  12.3*  HGB 11.1* 9.6* 10.0*  HCT 31.5* 27.0* 27.9*  PLT 111*  --  136*   BMET Recent Labs    08/22/19 0424 08/23/19 0721  NA 131* 137  K 3.7 3.3*  CL 100 106  CO2 19* 20*  GLUCOSE 145* 115*  BUN 22 19  CREATININE 1.67* 1.18  CALCIUM 7.3* 7.5*   PT/INR Recent Labs    08/20/19 1321  LABPROT 17.6*  INR 1.5*   CMP     Component Value Date/Time   NA 137 08/23/2019 0721   K 3.3 (L) 08/23/2019 0721   CL 106 08/23/2019 0721   CO2 20 (L) 08/23/2019 0721   GLUCOSE 115 (H) 08/23/2019 0721   BUN 19 08/23/2019 0721   CREATININE 1.18 08/23/2019 0721   CALCIUM 7.5 (L) 08/23/2019 0721   PROT 4.7 (L) 08/23/2019 0721   ALBUMIN 2.1 (L) 08/23/2019 0721   AST 69 (H) 08/23/2019 0721   ALT 80 (H) 08/23/2019 0721   ALKPHOS 109 08/23/2019 0721   BILITOT 1.7 (H) 08/23/2019 0721   GFRNONAA >60 08/23/2019 0721   GFRAA >60 08/23/2019 0721   Lipase     Component Value Date/Time  LIPASE 18 08/20/2019 1321    Studies/Results: No results found.   Jerre Simon, [facility] Surgery Pager [phone] Horald Chestnut, & Friday 7:00am - 4:30pm Thursdays 7:00am -11:30am

## 2019-08-23 NOTE — Care Management Important Message (Signed)
Important Message  Patient Details  Name: Joseph Orr MRN: [id number] Date of Birth: 1951-10-30   Medicare Important Message Given:  Yes     Shelda Altes 08/23/2019, 1:26 PM

## 2019-08-23 NOTE — Progress Notes (Addendum)
PROGRESS NOTE    Joseph Orr  ZRA:[id number]  DOB: 12-05-1950  DOA: 08/18/2019 PCP: [place], Betty G, MD  Brief Narrative:  68 y.o.male, former smoker, h/o recent lacunar CVA in May ( asa plus plavix for 3 months, now plavix only), GERD, hyperlipidemia, and alcohol usewho was directly admitted for further management of right-sided abdominal pain. He initially presented to  ED 08/15/2019 with c/o chest/abdominal pain - labs notable for AST 75, ALT 35, alk phos 90, total bilirubin 1.9. Trop unremarkable. CTA chest/abdomen/pelvis dissection study was obtained which was negative for aortic dissection or aneurysm. Mild pericholecystic haziness with possible gallbladder sludge was noted. He was discharged home with recommendations for GI follow-up. Outpatient RUQ ultrasound 08/17/2019 showed minimal cholelithiasis with large amount of sludge, CBD diameter 8 mm. GI referred to Gen surgery. He was seen by Dr. Redmond Pulling with [facility] surgery who recommended admission to hospitalist service for inpatient cholecystectomy after neurological evaluation given recent stroke vs percutaneous drain by IR if felt to be high risk.  Hospital course: Seen by GI/GS- underwent ERCP 10/18 revealing pus in the ampulla, stones removed. HC course complicated by AKI/bacteremia.  Blood cultures growing Klebsiella, titrated antibiotics (zosyn-->rocephin/flagyl). Neurology cleared for intervention. Plan for lap cholecystectomy 10/19.  Subjective:  Postop day 1.  Patient afebrile.  Feels much better today, tolerating diet and able to ambulate in the room.  Has not had a bowel movement but passing gas.  Asking about discharge plan.  Objective: Vitals:   08/22/19 2002 08/23/19 0142 08/23/19 0437 08/23/19 0749  BP: 108/63 129/80 121/82 140/73  Pulse: 68 73 74 73  Resp: _0 Temp: 98.1 F (36.7 C) 98 F (36.7 C) 97.6 F (36.4 C) 97.6 F (36.4 C)  TempSrc: Oral Oral Oral Oral  SpO2: 100% 99% 99%  99%  Weight:      Height:        Intake/Output Summary (Last 24 hours) at 08/23/2019 1028 Last data filed at 08/23/2019 0740 Gross per 24 hour  Intake 2207.46 ml  Output 2480 ml  Net -272.54 ml   Filed Weights   08/19/19 0112 08/21/19 0710 08/22/19 0831  Weight: 87.1 kg 87.1 kg 87.1 kg    Physical Examination:  General exam: Appears calm and comfortable  Respiratory system: Clear to auscultation. Respiratory effort normal. Cardiovascular system: S1 & S2 heard, RRR. No JVD, murmurs, rubs, gallops or clicks. No pedal edema. Gastrointestinal system: Abdomen nondistended today, right-sided JP drain with serosanguineous drainage, mildly tender at laparoscopic surgical sites as expected. No organomegaly or masses felt, bowel sounds heard. Central nervous system: somnolent from morphine/anesthesia. No new focal neurological deficits Skin: No rashes, lesions or ulcers Psychiatry:  Mood & affect anxious.     Data Reviewed: I have personally reviewed following labs and imaging studies  CBC: Recent Labs  Lab 08/18/19 2322  08/20/19 1047 08/20/19 2119 08/21/19 1208 08/22/19 0424 08/22/19 2009 08/23/19 0721  WBC 12.4*   < > 7.6 8.1 6.8 7.2  --  12.3*  NEUTROABS 9.8*  --   --  6.4  --   --   --   --   HGB 13.2   < > 13.0 11.1* 12.3* 11.1* 9.6* 10.0*  HCT 37.4*   < > 35.7* 32.6* 36.0* 31.5* 27.0* 27.9*  MCV 97.1   < > 96.0 98.8 99.2 97.5  --  96.5  PLT 127*   < > 139* PLATELET CLUMPS NOTED ON SMEAR, UNABLE TO ESTIMATE PLATELET CLUMPS NOTED  ON SMEAR, UNABLE TO ESTIMATE 111*  --  136*   < > = values in this interval not displayed.   Basic Metabolic Panel: Recent Labs  Lab 08/19/19 0500 08/20/19 1047 08/21/19 1208 08/22/19 0424 08/23/19 0721  NA 123* 131* 131* 131* 137  K 3.4* 3.7 3.4* 3.7 3.3*  CL 89* 97* 101 100 106  CO2 22 17* 17* 19* 20*  GLUCOSE 96 74 220* 145* 115*  BUN _0 CREATININE 0.98 1.46* 1.46* 1.67* 1.18  CALCIUM 8.1* 8.0* 7.5* 7.3* 7.5*  MG   --  1.5*  --   --   --    GFR: Estimated Creatinine Clearance: 65.5 mL/min (by C-G formula based on SCr of 1.18 mg/dL). Liver Function Tests: Recent Labs  Lab 08/19/19 0500 08/20/19 1047 08/21/19 1208 08/22/19 0424 08/23/19 0721  AST 32 408* 191* 118* 69*  ALT 27 160* 124* 102* 80*  ALKPHOS 63 217* 147* 124 109  BILITOT 3.1* 6.5* 5.8* 2.9* 1.7*  PROT 5.7* 5.3* 5.3* 4.8* 4.7*  ALBUMIN 2.8* 2.5* 2.2* 2.0* 2.1*   Recent Labs  Lab 08/20/19 1321  LIPASE 18  AMYLASE 19*   No results for input(s): AMMONIA in the last 168 hours. Coagulation Profile: Recent Labs  Lab 08/20/19 1321  INR 1.5*   Cardiac Enzymes: No results for input(s): CKTOTAL, CKMB, CKMBINDEX, TROPONINI in the last 168 hours. BNP (last 3 results) No results for input(s): PROBNP in the last 8760 hours. HbA1C: Recent Labs    08/22/19 0424  HGBA1C 4.7*   CBG: Recent Labs  Lab 08/21/19 2236 08/22/19 0809 08/22/19 1325 08/22/19 1741 08/22/19 2151  GLUCAP 158* 137* 149* 142* 120*   Lipid Profile: No results for input(s): CHOL, HDL, LDLCALC, TRIG, CHOLHDL, LDLDIRECT in the last 72 hours. Thyroid Function Tests: No results for input(s): TSH, T4TOTAL, FREET4, T3FREE, THYROIDAB in the last 72 hours. Anemia Panel: No results for input(s): VITAMINB12, FOLATE, FERRITIN, TIBC, IRON, RETICCTPCT in the last 72 hours. Sepsis Labs: No results for input(s): PROCALCITON, LATICACIDVEN in the last 168 hours.  Recent Results (from the past 240 hour(s))  SARS CORONAVIRUS 2 (TAT 6-24 HRS) Nasopharyngeal Nasopharyngeal Swab     Status: None   Collection Time: 08/15/19  3:09 PM   Specimen: Nasopharyngeal Swab  Result Value Ref Range Status   SARS Coronavirus 2 NEGATIVE NEGATIVE Final    Comment: (NOTE) SARS-CoV-2 target nucleic acids are NOT DETECTED. The SARS-CoV-2 RNA is generally detectable in upper and lower respiratory specimens during the acute phase of infection. Negative results do not preclude SARS-CoV-2  infection, do not rule out co-infections with other pathogens, and should not be used as the sole basis for treatment or other patient management decisions. Negative results must be combined with clinical observations, patient history, and epidemiological information. The expected result is Negative. Fact Sheet for Patients: SugarRoll.be Fact Sheet for Healthcare Providers: https://www.woods-mathews.com/ This test is not yet approved or cleared by the [place] FDA and  has been authorized for detection and/or diagnosis of SARS-CoV-2 by FDA under an Emergency Use Authorization (EUA). This EUA will remain  in effect (meaning this test can be used) for the duration of the COVID-19 declaration under Section 56 4(b)(1) of the Act, 21 U.S.C. section 360bbb-3(b)(1), unless the authorization is terminated or revoked sooner. Performed at [facility], [place] [address], [place], [place] [address]   Culture, blood (Routine X 2) w Reflex to ID Panel     Status:  Abnormal   Collection Time: 08/20/19  1:15 PM   Specimen: BLOOD  Result Value Ref Range Status   Specimen Description BLOOD LEFT ANTECUBITAL  Final   Special Requests   Final    BOTTLES DRAWN AEROBIC AND ANAEROBIC Blood Culture adequate volume   Culture  Setup Time   Final    GRAM NEGATIVE RODS IN BOTH AEROBIC AND ANAEROBIC BOTTLES CRITICAL RESULT CALLED TO, READ BACK BY AND VERIFIED WITH: PHARMD E MARTIN [id number] AT 1250 BY CM Performed at [facility], [place] [address], [place], [place] [address]    Culture KLEBSIELLA PNEUMONIAE (A)  Final   Report Status 08/23/2019 FINAL  Final   Organism ID, Bacteria KLEBSIELLA PNEUMONIAE  Final      Susceptibility   Klebsiella pneumoniae - MIC*    AMPICILLIN >=32 RESISTANT Resistant     CEFAZOLIN <=4 SENSITIVE Sensitive     CEFEPIME <=1 SENSITIVE Sensitive     CEFTAZIDIME <=1 SENSITIVE Sensitive     CEFTRIAXONE <=1 SENSITIVE Sensitive      CIPROFLOXACIN <=0.25 SENSITIVE Sensitive     GENTAMICIN <=1 SENSITIVE Sensitive     IMIPENEM <=0.25 SENSITIVE Sensitive     TRIMETH/SULFA <=20 SENSITIVE Sensitive     AMPICILLIN/SULBACTAM 4 SENSITIVE Sensitive     PIP/TAZO 8 SENSITIVE Sensitive     Extended ESBL NEGATIVE Sensitive     * KLEBSIELLA PNEUMONIAE  Blood Culture ID Panel (Reflexed)     Status: Abnormal   Collection Time: 08/20/19  1:15 PM  Result Value Ref Range Status   Enterococcus species NOT DETECTED NOT DETECTED Final   Listeria monocytogenes NOT DETECTED NOT DETECTED Final   Staphylococcus species NOT DETECTED NOT DETECTED Final   Staphylococcus aureus (BCID) NOT DETECTED NOT DETECTED Final   Streptococcus species NOT DETECTED NOT DETECTED Final   Streptococcus agalactiae NOT DETECTED NOT DETECTED Final   Streptococcus pneumoniae NOT DETECTED NOT DETECTED Final   Streptococcus pyogenes NOT DETECTED NOT DETECTED Final   Acinetobacter baumannii NOT DETECTED NOT DETECTED Final   Enterobacteriaceae species DETECTED (A) NOT DETECTED Final    Comment: Enterobacteriaceae represent a large family of gram-negative bacteria, not a single organism. CRITICAL RESULT CALLED TO, READ BACK BY AND VERIFIED WITH: PHARMD E MARTIN [id number] AT 1250 BY CM    Enterobacter cloacae complex NOT DETECTED NOT DETECTED Final   Escherichia coli NOT DETECTED NOT DETECTED Final   Klebsiella oxytoca NOT DETECTED NOT DETECTED Final   Klebsiella pneumoniae DETECTED (A) NOT DETECTED Final    Comment: CRITICAL RESULT CALLED TO, READ BACK BY AND VERIFIED WITH: PHARMD E MARTIN [id number] AT 1250 BY CM    Proteus species NOT DETECTED NOT DETECTED Final   Serratia marcescens NOT DETECTED NOT DETECTED Final   Carbapenem resistance NOT DETECTED NOT DETECTED Final   Haemophilus influenzae NOT DETECTED NOT DETECTED Final   Neisseria meningitidis NOT DETECTED NOT DETECTED Final   Pseudomonas aeruginosa NOT DETECTED NOT DETECTED Final   Candida albicans NOT  DETECTED NOT DETECTED Final   Candida glabrata NOT DETECTED NOT DETECTED Final   Candida krusei NOT DETECTED NOT DETECTED Final   Candida parapsilosis NOT DETECTED NOT DETECTED Final   Candida tropicalis NOT DETECTED NOT DETECTED Final    Comment: Performed at [facility], [address], [place], [place] [address]  Culture, blood (Routine X 2) w Reflex to ID Panel     Status: Abnormal   Collection Time: 08/20/19  1:19 PM   Specimen: BLOOD LEFT WRIST  Result Value Ref Range Status   Specimen Description BLOOD LEFT WRIST  Final   Special Requests   Final    BOTTLES DRAWN AEROBIC AND ANAEROBIC Blood Culture adequate volume   Culture  Setup Time   Final    GRAM NEGATIVE RODS IN BOTH AEROBIC AND ANAEROBIC BOTTLES CRITICAL RESULT CALLED TO, READ BACK BY AND VERIFIED WITH: PHARMD E MARTIN [id number] AT 1258 BY CM    Culture (A)  Final    KLEBSIELLA PNEUMONIAE SUSCEPTIBILITIES PERFORMED ON PREVIOUS CULTURE WITHIN THE LAST 5 DAYS. Performed at [facility], [place] [address] [place], [place] [address]    Report Status 08/23/2019 FINAL  Final  Culture, Urine     Status: Abnormal   Collection Time: 08/20/19  1:57 PM   Specimen: Urine, Clean Catch  Result Value Ref Range Status   Specimen Description URINE, CLEAN CATCH  Final   Special Requests NONE  Final   Culture (A)  Final    <10,000 COLONIES/mL INSIGNIFICANT GROWTH Performed at [facility], [place] [address], [place], [place] [address]    Report Status 08/21/2019 FINAL  Final  SARS Coronavirus 2 by RT PCR (hospital order, performed in [facility] hospital lab) Nasopharyngeal Nasopharyngeal Swab     Status: None   Collection Time: 08/20/19  2:26 PM   Specimen: Nasopharyngeal Swab  Result Value Ref Range Status   SARS Coronavirus 2 NEGATIVE NEGATIVE Final    Comment: (NOTE) If result is NEGATIVE SARS-CoV-2 target nucleic acids are NOT DETECTED. The SARS-CoV-2 RNA is generally detectable in upper and lower  respiratory  specimens during the acute phase of infection. The lowest  concentration of SARS-CoV-2 viral copies this assay can detect is 250  copies / mL. A negative result does not preclude SARS-CoV-2 infection  and should not be used as the sole basis for treatment or other  patient management decisions.  A negative result may occur with  improper specimen collection / handling, submission of specimen other  than nasopharyngeal swab, presence of viral mutation(s) within the  areas targeted by this assay, and inadequate number of viral copies  (<250 copies / mL). A negative result must be combined with clinical  observations, patient history, and epidemiological information. If result is POSITIVE SARS-CoV-2 target nucleic acids are DETECTED. The SARS-CoV-2 RNA is generally detectable in upper and lower  respiratory specimens dur ing the acute phase of infection.  Positive  results are indicative of active infection with SARS-CoV-2.  Clinical  correlation with patient history and other diagnostic information is  necessary to determine patient infection status.  Positive results do  not rule out bacterial infection or co-infection with other viruses. If result is PRESUMPTIVE POSTIVE SARS-CoV-2 nucleic acids MAY BE PRESENT.   A presumptive positive result was obtained on the submitted specimen  and confirmed on repeat testing.  While 2019 novel coronavirus  (SARS-CoV-2) nucleic acids may be present in the submitted sample  additional confirmatory testing may be necessary for epidemiological  and / or clinical management purposes  to differentiate between  SARS-CoV-2 and other Sarbecovirus currently known to infect humans.  If clinically indicated additional testing with an alternate test  methodology [phone]) is advised. The SARS-CoV-2 RNA is generally  detectable in upper and lower respiratory sp ecimens during the acute  phase of infection. The expected result is Negative. Fact Sheet for  Patients:  StrictlyIdeas.no Fact Sheet for Healthcare Providers: BankingDealers.co.za This test is not yet approved or cleared by the  [place] Architectural technologist and has been authorized for detection and/or diagnosis of SARS-CoV-2 by FDA under an Print production planner (EUA).  This EUA will remain in effect (meaning this test can be used) for the duration of the COVID-19 declaration under Section 564(b)(1) of the Act, 21 U.S.C. section 360bbb-3(b)(1), unless the authorization is terminated or revoked sooner. Performed at [facility], [place] [address] [place], [place] [address]   Surgical pcr screen     Status: Abnormal   Collection Time: 08/21/19  4:39 PM   Specimen: Nasal Mucosa; Nasal Swab  Result Value Ref Range Status   MRSA, PCR NEGATIVE NEGATIVE Final   Staphylococcus aureus POSITIVE (A) NEGATIVE Final    Comment: (NOTE) The Xpert SA Assay (FDA approved for NASAL specimens in patients 63 years of age and older), is one component of a comprehensive surveillance program. It is not intended to diagnose infection nor to guide or monitor treatment. Performed at [facility], [place] [address], [place], [place] [address]       Radiology Studies: No results found.      Scheduled Meds: . acetaminophen  650 mg Oral Q6H  . Chlorhexidine Gluconate Cloth  6 each Topical Daily  . insulin aspart  0-9 Units Subcutaneous TID WC  . mupirocin ointment  1 application Nasal BID   Continuous Infusions: . sodium chloride Stopped (08/22/19 0506)  . cefTRIAXone (ROCEPHIN)  IV 2 g (08/22/19 1317)    Assessment & Plan:    1.  Sepsis due to acute cholecystitis/cholangitis with Klebsiella bacteremia: Patient underwent laparoscopic subtotal cholecystectomy by general surgery on 10/19 with intraoperative findings of severe acute on chronic cholecystitis. Currently on IV Rocephin/Flagyl.  Leukocytosis improved (12k->7K) but CBC today shows  leukocytosis at 12 K again.  Could be reactive from surgery yesterday.  Blood culture sensitivity available-Klebsiella sensitive to most antibiotics, resistant to ampicillin.  DC Flagyl.  Repeat blood cultures-if negative, can likely be transitioned to oral antibiotics and possibly discharged on Augmentin to complete a 10-day total antibiotic course. Total bili now down to 1.7 (peaked to 6.5 on 10/17).  2.  Recent BEE:FEOF was as an outpatient around May 1, had some right hand weakness. MRI obtained as outpatient showed lacunar stroke.  Did not follow up with Neurology. Had secondary stroke work up with echo, carotids, A1c, Lipids that was unremarkable. On Plavix since stroke (>3 months).  Neurology evaluated and cleared for surgery. Resume Plavix if okay with surgery. ?Not on statins at home (GI intolerance listed). Lipid profile on May 4 reassuring with total cholesterol 149, LDL 70, HDL 63 and TG 80. LFTs now improved after Rx for #1  Addendum 12.30 pm: Per GS , hold Plavix for another day.   3.  Hypovolemic hyponatremia: Sodium improved with IV fluids from 123--> 131-->137.Serum osmolality low at 263 on 10/16.  Urine osmolality low at 270 and urine sodium less than 10 per labs on 10/16  4.  AKI: Due to ATN in the setting of problem #1 versus volume loss/dehydration.  Patient presented with labs showing creatinine around 1.0--> went up to 1.4 and further peaked at 1.6 yesterday.  Now improved to 1.1 with IV hydration overnight..  Avoid nephrotoxic agents.    5.  Acute blood loss anemia:Operative note indicates bleeding intraoperatively (not sure if more than expected, EBL estimated to be 50 mL only).  Serial H&H overnight showed drop in hemoglobin from 11-->10.  Noted bloody drainage through abdominal drain.  Will discuss with  surgery if okay to resume Plavix for history of stroke.  Watch hemoglobin on antiplatelet agents and CBC in a.m.  6. ?  Mild hyperglycemia: Patient had blood glucose of greater  than 200 on 1 occasion.  Likely stress-induced hyperglycemia as patient's hemoglobin A1c at 4.7.  No known history of diabetes.  DC sliding scale insulin  7.  Hypokalemia: Likely dilutional with IV fluids.  Eating well today.  DC IV fluids and replace potassium.   DVT prophylaxis: SCDs for now Code Status: Full code Family / Patient Communication: Discussed with patient and bedside nurse Disposition Plan: Home when medically cleared possibly next 48 hours     LOS: 5 days    Time spent: 35 minutes    Guilford Shi, MD [facility] Pager [phone]  If 7PM-7AM, please contact night-coverage [email or web address] Password [email or web address] 08/23/2019, 10:28 AM

## 2019-08-24 DIAGNOSIS — B961 Klebsiella pneumoniae [K. pneumoniae] as the cause of diseases classified elsewhere: Secondary | ICD-10-CM

## 2019-08-24 DIAGNOSIS — R7881 Bacteremia: Secondary | ICD-10-CM

## 2019-08-24 LAB — COMPREHENSIVE METABOLIC PANEL
ALT: 64 U/L — ABNORMAL HIGH (ref 0–44)
AST: 61 U/L — ABNORMAL HIGH (ref 15–41)
Albumin: 2 g/dL — ABNORMAL LOW (ref 3.5–5.0)
Alkaline Phosphatase: 107 U/L (ref 38–126)
Anion gap: 9 (ref 5–15)
BUN: 12 mg/dL (ref 8–23)
CO2: 20 mmol/L — ABNORMAL LOW (ref 22–32)
Calcium: 7.5 mg/dL — ABNORMAL LOW (ref 8.9–10.3)
Chloride: 108 mmol/L (ref 98–111)
Creatinine, Ser: 1.03 mg/dL (ref 0.61–1.24)
GFR calc Af Amer: >60 mL/min (ref >60)
GFR calc non Af Amer: >60 mL/min (ref >60)
Glucose, Bld: 93 mg/dL (ref 70–99)
Potassium: 4.2 mmol/L (ref 3.5–5.1)
Sodium: 137 mmol/L (ref 135–145)
Total Bilirubin: 1.5 mg/dL — ABNORMAL HIGH (ref 0.3–1.2)
Total Protein: 4.6 g/dL — ABNORMAL LOW (ref 6.5–8.1)

## 2019-08-24 LAB — GLUCOSE, CAPILLARY: Glucose-Capillary: 95 mg/dL (ref 70–99)

## 2019-08-24 LAB — CBC
HCT: 27.4 % — ABNORMAL LOW (ref 39.0–52.0)
Hemoglobin: 9.6 g/dL — ABNORMAL LOW (ref 13.0–17.0)
MCH: 34.8 pg — ABNORMAL HIGH (ref 26.0–34.0)
MCHC: 35 g/dL (ref 30.0–36.0)
MCV: 99.3 fL (ref 80.0–100.0)
Platelets: 129 10*3/uL — ABNORMAL LOW (ref 150–400)
RBC: 2.76 MIL/uL — ABNORMAL LOW (ref 4.22–5.81)
RDW: 12.2 % (ref 11.5–15.5)
WBC: 11.5 10*3/uL — ABNORMAL HIGH (ref 4.0–10.5)
nRBC: 0 % (ref 0.0–0.2)

## 2019-08-24 NOTE — Discharge Instructions (Signed)
Please arrive at least 30 min before your appointment to complete your check in paperwork.  If you are unable to arrive 30 min prior to your appointment time we may have to cancel or reschedule you. ° °LAPAROSCOPIC SURGERY: POST OP INSTRUCTIONS  °1. DIET: Follow a light bland diet the first 24 hours after arrival home, such as soup, liquids, crackers, etc. Be sure to include lots of fluids daily. Avoid fast food or heavy meals as your are more likely to get nauseated. Eat a low fat the next few days after surgery.  °2. Take your usually prescribed home medications unless otherwise directed. °3. PAIN CONTROL:  °1. Pain is best controlled by a usual combination of three different methods TOGETHER:  °1. Ice/Heat °2. Over the counter pain medication °3. Prescription pain medication °2. Most patients will experience some swelling and bruising around the incisions. Ice packs or heating pads (30-60 minutes up to 6 times a day) will help. Use ice for the first few days to help decrease swelling and bruising, then switch to heat to help relax tight/sore spots and speed recovery. Some people prefer to use ice alone, heat alone, alternating between ice & heat. Experiment to what works for you. Swelling and bruising can take several weeks to resolve.  °3. It is helpful to take an over-the-counter pain medication regularly for the first few weeks. Choose one of the following that works best for you:  °1. Naproxen (Aleve, etc) Two 220mg tabs twice a day °2. Ibuprofen (Advil, etc) Three 200mg tabs four times a day (every meal & bedtime) °3. Acetaminophen (Tylenol, etc) 500-650mg four times a day (every meal & bedtime) °4. A prescription for pain medication (such as oxycodone, hydrocodone, etc) should be given to you upon discharge. Take your pain medication as prescribed.  °1. If you are having problems/concerns with the prescription medicine (does not control pain, nausea, vomiting, rash, itching, etc), please call us [phone] to see if we need to switch you to a different pain medicine that will work better for you and/or control your side effect better. °2. If you need a refill on your pain medication, please contact your pharmacy. They will contact our office to request authorization. Prescriptions will not be filled after 5 pm or on week-ends. °4. Avoid getting constipated. Between the surgery and the pain medications, it is common to experience some constipation. Increasing fluid intake and taking a fiber supplement (such as Metamucil, Citrucel, FiberCon, MiraLax, etc) 1-2 times a day regularly will usually help prevent this problem from occurring. A mild laxative (prune juice, Milk of Magnesia, MiraLax, etc) should be taken according to package directions if there are no bowel movements after 48 hours.  °5. Watch out for diarrhea. If you have many loose bowel movements, simplify your diet to bland foods & liquids for a few days. Stop any stool softeners and decrease your fiber supplement. Switching to mild anti-diarrheal medications (Kayopectate, Pepto Bismol) can help. If this worsens or does not improve, please call us. °6. Wash / shower every day. You may shower over the dressings as they are waterproof. Continue to shower over incision(s) after the dressing is off. °7. Remove your waterproof bandages 5 days after surgery. You may leave the incision open to air. You may replace a dressing/Band-Aid to cover the incision for comfort if you wish.  °8. ACTIVITIES as tolerated:  °1. You may resume regular (light) daily activities beginning the next day--such as daily self-care, walking, climbing stairs--gradually   increasing activities as tolerated. If you can walk 30 minutes without difficulty, it is safe to try more intense activity such as jogging, treadmill, bicycling, low-impact aerobics, swimming, etc. 2. Save the most intensive and strenuous activity for last such as sit-ups, heavy lifting, contact sports, etc Refrain  from any heavy lifting or straining until you are off narcotics for pain control.  3. DO NOT PUSH THROUGH PAIN. Let pain be your guide: If it hurts to do something, don't do it. Pain is your body warning you to avoid that activity for another week until the pain goes down. 4. You may drive when you are no longer taking prescription pain medication, you can comfortably wear a seatbelt, and you can safely maneuver your car and apply brakes. 5. You may have sexual intercourse when it is comfortable.  9. FOLLOW UP in our office  1. Please call [facility] at [phone] to set up an appointment to see your surgeon in the office for a follow-up appointment approximately 2-3 weeks after your surgery. 2. Make sure that you call for this appointment the day you arrive home to insure a convenient appointment time.      10. IF YOU HAVE DISABILITY OR FAMILY LEAVE FORMS, BRING THEM TO THE               OFFICE FOR PROCESSING.   WHEN TO CALL us [phone]:  1. Poor pain control 2. Reactions / problems with new medications (rash/itching, nausea, etc)  3. Fever over 101.5 F (38.5 C) 4. Inability to urinate 5. Nausea and/or vomiting 6. Worsening swelling or bruising 7. Continued bleeding from incision. 8. Increased pain, redness, or drainage from the incision  The clinic staff is available to answer your questions during regular business hours (8:30am-5pm). Please dont hesitate to call and ask to speak to one of our nurses for clinical concerns.  If you have a medical emergency, go to the nearest emergency room or call 911.  A surgeon from [facility] is always on call at the [facility], [address], [place], [place], [place] [address] ?  MAIN: [phone] ? TOLL FREE: [phone] ?  FAX [phone]  [email or web address]    [facility] Surgical drains are used to remove extra fluid that normally builds up in a surgical  wound after surgery. A surgical drain helps to heal a surgical wound. Different kinds of surgical drains include:  Active drains. These drains use suction to pull drainage away from the surgical wound. Drainage flows through a tube to a container outside of the body. With these drains, you need to keep the bulb or the drainage container flat (compressed) at all times, except while you empty it. Flattening the bulb or container creates suction.  Passive drains. These drains allow fluid to drain naturally, by gravity. Drainage flows through a tube to a bandage (dressing) or a container outside of the body. Passive drains do not need to be emptied. A drain is placed during surgery. Right after surgery, drainage is usually bright red and a little thicker than water. The drainage may gradually turn yellow or pink and become thinner. It is likely that your health care provider will remove the drain when the drainage stops or when the amount decreases to 1-2 Tbsp (15-30 mL) during a 24-hour period. Supplies needed:  Tape.  Germ-free cleaning solution (sterile saline).  Cotton swabs.  Split gauze drain sponge: 4 x  4 inches (10 x 10 cm).  Gauze square: 4 x 4 inches (10 x 10 cm). How to care for your surgical drain Care for your drain as told by your health care provider. This is important to help prevent infection. If your drain is placed at your back, or any other hard-to-reach area, ask another person to assist you in performing the following tasks: General care  Keep the skin around the drain dry and covered with a dressing at all times.  Check your drain area every day for signs of infection. Check for: ? Redness, swelling, or pain. ? Pus or a bad smell. ? Cloudy drainage. ? Tenderness or pressure at the drain exit site. Changing the dressing Follow instructions from your health care provider about how to change your dressing. Change your dressing at least once a day. Change it more often if  needed to keep the dressing dry. Make sure you: 1. Gather your supplies. 2. Wash your hands with soap and water before you change your dressing. If soap and water are not available, use hand sanitizer. 3. Remove the old dressing. Avoid using scissors to do that. 4. Wash your hands with soap and water again after removing the old dressing. 5. Use sterile saline to clean your skin around the drain. You may need to use a cotton swab to clean the skin. 6. Place the tube through the slit in a drain sponge. Place the drain sponge so that it covers your wound. 7. Place the gauze square or another drain sponge on top of the drain sponge that is on the wound. Make sure the tube is between those layers. 8. Tape the dressing to your skin. 9. Tape the drainage tube to your skin 1-2 inches (2.5-5 cm) below the place where the tube enters your body. Taping keeps the tube from pulling on any stitches (sutures) that you have. 10. Wash your hands with soap and water. 11. Write down the color of your drainage and how often you change your dressing. How to empty your active drain  1. Make sure that you have a measuring cup that you can empty your drainage into. 2. Wash your hands with soap and water. If soap and water are not available, use hand sanitizer. 3. Loosen any pins or clips that hold the tube in place. 4. If your health care provider tells you to strip the tube to prevent clots and tube blockages: ? Hold the tube at the skin with one hand. Use your other hand to pinch the tubing with your thumb and first finger. ? Gently move your fingers down the tube while squeezing very lightly. This clears any drainage, clots, or tissue from the tube. ? You may need to do this several times each day to keep the tube clear. Do not pull on the tube. 5. Open the bulb cap or the drain plug. Do not touch the inside of the cap or the bottom of the plug. 6. Turn the device upside down and gently squeeze. 7. Empty all of  the drainage into the measuring cup. 8. Compress the bulb or the container and replace the cap or the plug. To compress the bulb or the container, squeeze it firmly in the middle while you close the cap or plug the container. 9. Write down the amount of drainage that you have in each 24-hour period. If you have less than 2 Tbsp (30 mL) of drainage during 24 hours, contact your health care provider. 10. Flush  the drainage down the toilet. 11. Wash your hands with soap and water. Contact a health care provider if:  You have redness, swelling, or pain around your drain area.  You have pus or a bad smell coming from your drain area.  You have a fever or chills.  The skin around your drain is warm to the touch.  The amount of drainage that you have is increasing instead of decreasing.  You have drainage that is cloudy.  There is a sudden stop or a sudden decrease in the amount of drainage that you have.  Your drain tube falls out.  Your active drain does not stay compressed after you empty it. Summary  Surgical drains are used to remove extra fluid that normally builds up in a surgical wound after surgery.  Different kinds of surgical drains include active drains and passive drains. Active drains use suction to pull drainage away from the surgical wound, and passive drains allow fluid to drain naturally.  It is important to care for your drain to prevent infection. If your drain is placed at your back, or any other hard-to-reach area, ask another person to assist you.  Contact your health care provider if you have redness, swelling, or pain around your drain area. This information is not intended to replace advice given to you by your health care provider. Make sure you discuss any questions you have with your health care provider. Document Released: 10/17/2000 Document Revised: 11/24/2018 Document Reviewed: 11/24/2018 Elsevier Patient Education  2020 [employer].

## 2019-08-24 NOTE — Evaluation (Signed)
Physical Therapy Evaluation Patient Details Name: Joseph Orr MRN: [id number] DOB: 1951/01/09 Today's Date: 08/24/2019   History of Present Illness  Pt is a 68 y/o male admitted secondary to nausea and chest pain. Found to have cholecystitis. Pt is s/p ERCP and s/p lap chole. PMH includes CVA, gout, and GERD.   Clinical Impression  Pt admitted secondary to problem above with deficits below. Pt requiring min guard A for mobility tasks. Preferred to use IV pole for increased steadiness during gait. Pt reports he feels more unsteady and would like to have cane for home use. Feel pt would benefit from outpatient PT to address higher level balance deficits as well. Will continue to follow acutely to maximize functional mobility independence and safety.     Follow Up Recommendations Outpatient PT    Equipment Recommendations  Cane    Recommendations for Other Services       Precautions / Restrictions Precautions Precautions: None Restrictions Weight Bearing Restrictions: No      Mobility  Bed Mobility Overal bed mobility: Needs Assistance Bed Mobility: Supine to Sit;Sit to Supine     Supine to sit: Supervision Sit to supine: Supervision   General bed mobility comments: Supervision for safety. Educated about using log roll technique to decrease strain on abdomen.   Transfers Overall transfer level: Needs assistance Equipment used: None Transfers: Sit to/from Stand Sit to Stand: Min guard         General transfer comment: Min guard for safety.   Ambulation/Gait Ambulation/Gait assistance: Min guard Gait Distance (Feet): 100 Feet Assistive device: IV Pole Gait Pattern/deviations: Step-through pattern;Decreased stride length Gait velocity: Decreased   General Gait Details: Slow, cautious gait. Pt reporting increased fatigue which limited ambulation distance. Pt reporting he feels more unsteady and would like to have a cane for d/c. Will practice with cane during  next session.   Stairs            Wheelchair Mobility    Modified Rankin (Stroke Patients Only)       Balance Overall balance assessment: Needs assistance Sitting-balance support: No upper extremity supported;Feet supported Sitting balance-Leahy Scale: Good     Standing balance support: Single extremity supported;During functional activity Standing balance-Leahy Scale: Fair Standing balance comment: Able to maintain static standing without UE supoprt                              Pertinent Vitals/Pain Pain Assessment: 0-10 Pain Score: 2  Pain Location: abdomen Pain Descriptors / Indicators: Grimacing;Guarding Pain Intervention(s): Monitored during session;Limited activity within patient's tolerance;Repositioned    Home Living Family/patient expects to be discharged to:: Private residence Living Arrangements: Spouse/significant other Available Help at Discharge: Family Type of Home: House Home Access: Stairs to enter Entrance Stairs-Rails: None Entrance Stairs-Number of Steps: 2(1 curb and 1 threshold) Home Layout: One level Home Equipment: None      Prior Function Level of Independence: Independent               Hand Dominance        Extremity/Trunk Assessment   Upper Extremity Assessment Upper Extremity Assessment: Defer to OT evaluation    Lower Extremity Assessment Lower Extremity Assessment: Generalized weakness    Cervical / Trunk Assessment Cervical / Trunk Assessment: Normal  Communication   Communication: No difficulties  Cognition Arousal/Alertness: Awake/alert Behavior During Therapy: WFL for tasks assessed/performed Overall Cognitive Status: Within Functional Limits for tasks assessed  General Comments      Exercises     Assessment/Plan    PT Assessment Patient needs continued PT services  PT Problem List Decreased strength;Decreased balance;Decreased  mobility;Decreased knowledge of use of DME       PT Treatment Interventions Gait training;DME instruction;Functional mobility training;Stair training;Therapeutic activities;Therapeutic exercise;Balance training;Patient/family education    PT Goals (Current goals can be found in the Care Plan section)  Acute Rehab PT Goals Patient Stated Goal: to go home PT Goal Formulation: With patient Time For Goal Achievement: 09/07/19 Potential to Achieve Goals: Good    Frequency Min 3X/week   Barriers to discharge        Co-evaluation               AM-PAC PT "6 Clicks" Mobility  Outcome Measure Help needed turning from your back to your side while in a flat bed without using bedrails?: None Help needed moving from lying on your back to sitting on the side of a flat bed without using bedrails?: None Help needed moving to and from a bed to a chair (including a wheelchair)?: A Little Help needed standing up from a chair using your arms (e.g., wheelchair or bedside chair)?: A Little Help needed to walk in hospital room?: A Little Help needed climbing 3-5 steps with a railing? : A Little 6 Click Score: 20    End of Session Equipment Utilized During Treatment: Gait belt Activity Tolerance: Patient limited by fatigue Patient left: in bed;with call bell/phone within reach(sitting EOB ) Nurse Communication: Mobility status PT Visit Diagnosis: Muscle weakness (generalized) (M62.81);Unsteadiness on feet (R26.81)    Time: 8315-1761 PT Time Calculation (min) (ACUTE ONLY): 16 min   Charges:   PT Evaluation $PT Eval Low Complexity: [place], PT, DPT  Acute Rehabilitation Services  Pager: [phone] Office: [phone]   Rudean Hitt 08/24/2019, 6:07 PM

## 2019-08-24 NOTE — Progress Notes (Signed)
[facility]/Trauma Progress Note  2 Days Post-Op   Assessment/Plan GOUT GERD CVA 02/2019 - per neurology, okay for surgery and to hold plavix, resume plavix as soon as possible postop  Choledocholithiasis and cholecystitis - CBD cleared via ERCP 10/18 - s/p laparoscopic subtotal (stapled) cholecystectomy, Dr. Kae Heller, 10/19 - continue JP drain  FEN:reg diet VTE: SCD's,hold lovenox for now XB:[id number] 10/17>> Foley:none Follow up:[facility] 2 weeks  Plan: Hgb stable, okay for discharge from a surgical standpoint. F/U in AVS    LOS: 6 days    Subjective: CC: mild abdominal pain  Pt only taking tylenol for pain. No issues overnight.   Objective: Vital signs in last 24 hours: Temp:  [98.1 F (36.7 C)-98.4 F (36.9 C)] 98.1 F (36.7 C) (10/21 0450) Pulse Rate:  [74-75] 74 (10/21 0450) Resp:  [17-18] 17 (10/21 0450) BP: (131-139)/(71-76) 131/71 (10/21 0450) SpO2:  [99 %-100 %] 99 % (10/21 0450) Last BM Date: 08/23/19  Intake/Output from previous day: 10/20 0701 - 10/21 0700 In: 360 [P.O.:360] Out: 450 [Urine:350; Drains:100] Intake/Output this shift: No intake/output data recorded.  PE: Gen:  Alert, NAD, pleasant, cooperative Pulm:  Rate and effort normal Abd: Soft, ND, incisions with glue intact are well appearing, drain with serosanguinous drainage, very mild TTP RUQ without guarding, no peritonitis  Skin: no rashes noted, warm and dry   Anti-infectives: Anti-infectives (From admission, onward)   Start     Dose/Rate Route Frequency Ordered Stop   08/21/19 1400  cefTRIAXone (ROCEPHIN) 2 g in sodium chloride 0.9 % 100 mL IVPB     2 g 200 mL/hr over 30 Minutes Intravenous Every 24 hours 08/21/19 1306     08/21/19 1400  metroNIDAZOLE (FLAGYL) IVPB 500 mg  Status:  Discontinued     500 mg 100 mL/hr over 60 Minutes Intravenous Every 8 hours 08/21/19 1310 08/23/19 1027   08/20/19 1400  piperacillin-tazobactam (ZOSYN) IVPB 3.375 g  Status:   Discontinued     3.375 g 12.5 mL/hr over 240 Minutes Intravenous Every 8 hours 08/20/19 1217 08/21/19 1306      Lab Results:  Recent Labs    08/23/19 0721 08/24/19 0300  WBC 12.3* 11.5*  HGB 10.0* 9.6*  HCT 27.9* 27.4*  PLT 136* 129*   BMET Recent Labs    08/23/19 0721 08/24/19 0300  NA 137 137  K 3.3* 4.2  CL 106 108  CO2 20* 20*  GLUCOSE 115* 93  BUN 19 12  CREATININE 1.18 1.03  CALCIUM 7.5* 7.5*   PT/INR No results for input(s): LABPROT, INR in the last 72 hours. CMP     Component Value Date/Time   NA 137 08/24/2019 0300   K 4.2 08/24/2019 0300   CL 108 08/24/2019 0300   CO2 20 (L) 08/24/2019 0300   GLUCOSE 93 08/24/2019 0300   BUN 12 08/24/2019 0300   CREATININE 1.03 08/24/2019 0300   CALCIUM 7.5 (L) 08/24/2019 0300   PROT 4.6 (L) 08/24/2019 0300   ALBUMIN 2.0 (L) 08/24/2019 0300   AST 61 (H) 08/24/2019 0300   ALT 64 (H) 08/24/2019 0300   ALKPHOS 107 08/24/2019 0300   BILITOT 1.5 (H) 08/24/2019 0300   GFRNONAA >60 08/24/2019 0300   GFRAA >60 08/24/2019 0300   Lipase     Component Value Date/Time   LIPASE 18 08/20/2019 1321    Studies/Results: No results found.   Kalman Drape, [facility] Pager [phone] Cristine Polio, & Friday 7:00am - 4:30pm Thursdays 7:00am -  11:30am

## 2019-08-24 NOTE — Progress Notes (Signed)
PROGRESS NOTE    Joseph Orr  DHR:[id number] DOB: 02/02/1951 DOA: 08/18/2019 PCP: [place], Betty G, MD    Brief Narrative:  68 y.o.male, former smoker, h/o recent lacunarCVAin May ( asa plus plavix for 3 months, now plavix only), GERD, hyperlipidemia, and alcohol usewho was directly admitted for further management of right-sided abdominal pain. Heinitially presented toED 08/15/2019 with c/o chest/abdominal pain -labs notable for AST 75, ALT 35, alk phos 90, total bilirubin 1.9. Trop unremarkable.CTA chest/abdomen/pelvis dissection study was obtained which was negative for aortic dissection or aneurysm. Mild pericholecystic haziness with possible gallbladder sludge was noted. He was discharged home withrecommendations forGI follow-up. OutpatientRUQ ultrasound 08/17/2019 showed minimal cholelithiasis with large amount of sludge, CBD diameter 8 mm. GI referred to Gen surgery.  He was seen by Dr. Redmond Pulling [facility] who recommendedadmission to hospitalist servicefor inpatient cholecystectomy afterneurological evaluation given recent stroke vspercutaneous drain by IR if felt to be high risk.  Hospital course: Seen by GI/GS- underwentERCP 10/18 revealingpus in the ampulla, stones removed.  Neurology cleared for intervention. Status post lap chole on 08/22/19.    Hospital course course complicated by AKI/bacteremia.Blood cultures growing Klebsiella, antibiotics descalated, now on Rocephin monotherapy (sp zosyn-->rocephin/flagyl).  Repeat blood cultures pending.  If negative x 48 hours, will transition to PO Augmentin for d/c.  Assessment & Plan:   Active Problems:   Hyperlipidemia   GERD (gastroesophageal reflux disease)   RUQ abdominal pain   History of CVA (cerebrovascular accident)   Cholangitis   Choledocholithiasis with acute cholecystitis   Sepsis due to acute cholecystitis/cholangitis with Klebsiella bacteremia: s/p ERCP on 10/18.  POD #2 from  laparoscopic subtotal cholecystectomy with intraoperative findings of severe acute on chronic cholecystitis. Leukocytosis improved.  Blood culture sensitivity available-Klebsiella sensitive to most antibiotics, resistant to ampicillin. DC'd Flagyl.  - continue IV Rocephin  - repeat blood cultures pending, no growth x 24 hours - will switch to PO Augmentin for discharge if repeat cx's negative x at least 48 hours.  Recent CVA: This was as an outpatient around May 1, had some right hand weakness. MRI obtained as outpatient showed lacunar stroke. Did not follow up with Neurology. Had secondary stroke work up with echo, carotids, A1c, Lipids that was unremarkable. On Plavix since stroke (>3 months). Neurology evaluated and cleared for surgery. Resume Plavix if okay with surgery. ?Not on statins at home (GI intolerance listed). Lipid profile on May 4 reassuring with total cholesterol 149, LDL 70, HDL 63 and TG 80.  - resume Plavix tomorrow per surgery  Hypovolemic hyponatremia: resolved with IV fluids from 123--> 131-->137.Serum osmolality low at 263 on 10/16.  Urine osmolality low at 270 and urine sodium less than 10 per labs on 10/16 - monitor w BMP  Acute Kidney Injury: Due to ATN in the setting of problem #1 versus volume loss/dehydration.  Patient presented with labs showing creatinine around 1.0--> went up to 1.4 and further peaked at 1.6.  Now improved to 1.03 with IV hydration.   - Avoid nephrotoxic agents.   - BMP in AM  Acute blood loss anemia: Operative note indicates bleeding intraoperatively (not sure if more than expected, EBL estimated to be 50 mL only).  Serial H&H overnight showed drop in hemoglobin from 11-->10.  Noted bloody drainage through abdominal drain.  Will discuss with surgery if okay to resume Plavix for history of stroke.   - CBC in AM  Mild hyperglycemia: Patient had blood glucose of greater than 200 on 1 occasion.  Likely stress-induced hyperglycemia as patient's  hemoglobin A1c at 4.7.  No known history of diabetes.  DC sliding scale insulin  Hypokalemia: resolved.  Likely dilutional with IV fluids.  Eating well today.  Monitor and replete as needed.   DVT prophylaxis: SCD's for now Code Status:   Code Status: Full Code Family Communication: left voicemail for daughter 10/21 Disposition Plan:  D/C home, expect tomorrow if repeat blood cultures negative x 48 hours.  Surgery f/u to check drain in 1 week.   Consultants:   General Surgery  Gastroenterology  Procedures:   ERCP 10/18  Lap cholecystectomy 10/19  Antimicrobials:  Rocephin, start 10/18, stop TBD pending repeat BCx's  Flagyl, start 10/18, stop 10/20   Subjective: Patient seen and examined, laying in bed taking a nap, awoke easily.  Reports feeling better.  Asking if okay to go home.  Discussed need for repeat blood cultures to return negative after at least 48 hours (tomorrow).  He agrees.  Denies fever/chills, N/V/D.  Reports pain fairly well controlled.  Objective: Vitals:   08/23/19 0437 08/23/19 0749 08/23/19 1943 08/24/19 0450  BP: 121/82 140/73 139/76 131/71  Pulse: 74 73 75 74  Resp: 18 20 18 17   Temp: 97.6 F (36.4 C) 97.6 F (36.4 C) 98.4 F (36.9 C) 98.1 F (36.7 C)  TempSrc: Oral Oral Oral Oral  SpO2: 99% 99% 100% 99%  Weight:      Height:        Intake/Output Summary (Last 24 hours) at 08/24/2019 1422 Last data filed at 08/24/2019 0606 Gross per 24 hour  Intake -  Output 100 ml  Net -100 ml   Filed Weights   08/19/19 0112 08/21/19 0710 08/22/19 0831  Weight: 87.1 kg 87.1 kg 87.1 kg    Examination:  General exam: awake, alert, no acute distress, obese HEENT: atraumatic, clear conjunctiva, anicteric sclera, moist mucus membranes, hearing grossly normal  Respiratory system: decreased breath sounds but clear, no wheezes, rales or rhonchi, normal respiratory effort. Cardiovascular system: normal S1/S2, RRR, no JVD, murmurs, rubs, gallops.    Gastrointestinal system: soft, non-tender, non-distended abdomen, no organomegaly or masses felt, normal bowel sounds.  Drain in place with serous fluid, dressing clean dry intact. Central nervous system: alert and oriented x4. no gross focal neurologic deficits, normal speech Extremities: moves all, normal tone Skin: dry, intact, normal temperature Psychiatry: normal mood, congruent affect, judgement and insight appear normal    Data Reviewed: I have personally reviewed following labs and imaging studies  CBC: Recent Labs  Lab 08/18/19 2322  08/20/19 2119 08/21/19 1208 08/22/19 0424 08/22/19 2009 08/23/19 0721 08/24/19 0300  WBC 12.4*   < > 8.1 6.8 7.2  --  12.3* 11.5*  NEUTROABS 9.8*  --  6.4  --   --   --   --   --   HGB 13.2   < > 11.1* 12.3* 11.1* 9.6* 10.0* 9.6*  HCT 37.4*   < > 32.6* 36.0* 31.5* 27.0* 27.9* 27.4*  MCV 97.1   < > 98.8 99.2 97.5  --  96.5 99.3  PLT 127*   < > PLATELET CLUMPS NOTED ON SMEAR, UNABLE TO ESTIMATE PLATELET CLUMPS NOTED ON SMEAR, UNABLE TO ESTIMATE 111*  --  136* 129*   < > = values in this interval not displayed.   Basic Metabolic Panel: Recent Labs  Lab 08/20/19 1047 08/21/19 1208 08/22/19 0424 08/23/19 0721 08/24/19 0300  NA 131* 131* 131* 137 137  K 3.7 3.4* 3.7 3.3*  4.2  CL 97* 101 100 106 108  CO2 17* 17* 19* 20* 20*  GLUCOSE 74 220* 145* 115* 93  BUN 13 18 22 19 12   CREATININE 1.46* 1.46* 1.67* 1.18 1.03  CALCIUM 8.0* 7.5* 7.3* 7.5* 7.5*  MG 1.5*  --   --   --   --    GFR: Estimated Creatinine Clearance: 75 mL/min (by C-G formula based on SCr of 1.03 mg/dL). Liver Function Tests: Recent Labs  Lab 08/20/19 1047 08/21/19 1208 08/22/19 0424 08/23/19 0721 08/24/19 0300  AST 408* 191* 118* 69* 61*  ALT 160* 124* 102* 80* 64*  ALKPHOS 217* 147* 124 109 107  BILITOT 6.5* 5.8* 2.9* 1.7* 1.5*  PROT 5.3* 5.3* 4.8* 4.7* 4.6*  ALBUMIN 2.5* 2.2* 2.0* 2.1* 2.0*   Recent Labs  Lab 08/20/19 1321  LIPASE 18  AMYLASE 19*   No  results for input(s): AMMONIA in the last 168 hours. Coagulation Profile: Recent Labs  Lab 08/20/19 1321  INR 1.5*   Cardiac Enzymes: No results for input(s): CKTOTAL, CKMB, CKMBINDEX, TROPONINI in the last 168 hours. BNP (last 3 results) No results for input(s): PROBNP in the last 8760 hours. HbA1C: Recent Labs    08/22/19 0424  HGBA1C 4.7*   CBG: Recent Labs  Lab 08/22/19 0809 08/22/19 1325 08/22/19 1741 08/22/19 2151 08/24/19 1243  GLUCAP 137* 149* 142* 120* 95   Lipid Profile: No results for input(s): CHOL, HDL, LDLCALC, TRIG, CHOLHDL, LDLDIRECT in the last 72 hours. Thyroid Function Tests: No results for input(s): TSH, T4TOTAL, FREET4, T3FREE, THYROIDAB in the last 72 hours. Anemia Panel: No results for input(s): VITAMINB12, FOLATE, FERRITIN, TIBC, IRON, RETICCTPCT in the last 72 hours. Sepsis Labs: No results for input(s): PROCALCITON, LATICACIDVEN in the last 168 hours.  Recent Results (from the past 240 hour(s))  SARS CORONAVIRUS 2 (TAT 6-24 HRS) Nasopharyngeal Nasopharyngeal Swab     Status: None   Collection Time: 08/15/19  3:09 PM   Specimen: Nasopharyngeal Swab  Result Value Ref Range Status   SARS Coronavirus 2 NEGATIVE NEGATIVE Final    Comment: (NOTE) SARS-CoV-2 target nucleic acids are NOT DETECTED. The SARS-CoV-2 RNA is generally detectable in upper and lower respiratory specimens during the acute phase of infection. Negative results do not preclude SARS-CoV-2 infection, do not rule out co-infections with other pathogens, and should not be used as the sole basis for treatment or other patient management decisions. Negative results must be combined with clinical observations, patient history, and epidemiological information. The expected result is Negative. Fact Sheet for Patients: SugarRoll.be Fact Sheet for Healthcare Providers: https://www.woods-mathews.com/ This test is not yet approved or cleared by  the [place] FDA and  has been authorized for detection and/or diagnosis of SARS-CoV-2 by FDA under an Emergency Use Authorization (EUA). This EUA will remain  in effect (meaning this test can be used) for the duration of the COVID-19 declaration under Section 56 4(b)(1) of the Act, 21 U.S.C. section 360bbb-3(b)(1), unless the authorization is terminated or revoked sooner. Performed at [facility], [place] [address] [place], [place] [address]   Culture, blood (Routine X 2) w Reflex to ID Panel     Status: Abnormal   Collection Time: 08/20/19  1:15 PM   Specimen: BLOOD  Result Value Ref Range Status   Specimen Description BLOOD LEFT ANTECUBITAL  Final   Special Requests   Final    BOTTLES DRAWN AEROBIC AND ANAEROBIC Blood Culture adequate volume   Culture  Setup Time  Final    GRAM NEGATIVE RODS IN BOTH AEROBIC AND ANAEROBIC BOTTLES CRITICAL RESULT CALLED TO, READ BACK BY AND VERIFIED WITH: PHARMD E MARTIN [id number] AT 1250 BY CM Performed at [facility], [place] [address], [place], [place] [address]    Culture KLEBSIELLA PNEUMONIAE (A)  Final   Report Status 08/23/2019 FINAL  Final   Organism ID, Bacteria KLEBSIELLA PNEUMONIAE  Final      Susceptibility   Klebsiella pneumoniae - MIC*    AMPICILLIN >=32 RESISTANT Resistant     CEFAZOLIN <=4 SENSITIVE Sensitive     CEFEPIME <=1 SENSITIVE Sensitive     CEFTAZIDIME <=1 SENSITIVE Sensitive     CEFTRIAXONE <=1 SENSITIVE Sensitive     CIPROFLOXACIN <=0.25 SENSITIVE Sensitive     GENTAMICIN <=1 SENSITIVE Sensitive     IMIPENEM <=0.25 SENSITIVE Sensitive     TRIMETH/SULFA <=20 SENSITIVE Sensitive     AMPICILLIN/SULBACTAM 4 SENSITIVE Sensitive     PIP/TAZO 8 SENSITIVE Sensitive     Extended ESBL NEGATIVE Sensitive     * KLEBSIELLA PNEUMONIAE  Blood Culture ID Panel (Reflexed)     Status: Abnormal   Collection Time: 08/20/19  1:15 PM  Result Value Ref Range Status   Enterococcus species NOT DETECTED NOT DETECTED Final    Listeria monocytogenes NOT DETECTED NOT DETECTED Final   Staphylococcus species NOT DETECTED NOT DETECTED Final   Staphylococcus aureus (BCID) NOT DETECTED NOT DETECTED Final   Streptococcus species NOT DETECTED NOT DETECTED Final   Streptococcus agalactiae NOT DETECTED NOT DETECTED Final   Streptococcus pneumoniae NOT DETECTED NOT DETECTED Final   Streptococcus pyogenes NOT DETECTED NOT DETECTED Final   Acinetobacter baumannii NOT DETECTED NOT DETECTED Final   Enterobacteriaceae species DETECTED (A) NOT DETECTED Final    Comment: Enterobacteriaceae represent a large family of gram-negative bacteria, not a single organism. CRITICAL RESULT CALLED TO, READ BACK BY AND VERIFIED WITH: PHARMD E MARTIN [id number] AT 1250 BY CM    Enterobacter cloacae complex NOT DETECTED NOT DETECTED Final   Escherichia coli NOT DETECTED NOT DETECTED Final   Klebsiella oxytoca NOT DETECTED NOT DETECTED Final   Klebsiella pneumoniae DETECTED (A) NOT DETECTED Final    Comment: CRITICAL RESULT CALLED TO, READ BACK BY AND VERIFIED WITH: PHARMD E MARTIN [id number] AT 1250 BY CM    Proteus species NOT DETECTED NOT DETECTED Final   Serratia marcescens NOT DETECTED NOT DETECTED Final   Carbapenem resistance NOT DETECTED NOT DETECTED Final   Haemophilus influenzae NOT DETECTED NOT DETECTED Final   Neisseria meningitidis NOT DETECTED NOT DETECTED Final   Pseudomonas aeruginosa NOT DETECTED NOT DETECTED Final   Candida albicans NOT DETECTED NOT DETECTED Final   Candida glabrata NOT DETECTED NOT DETECTED Final   Candida krusei NOT DETECTED NOT DETECTED Final   Candida parapsilosis NOT DETECTED NOT DETECTED Final   Candida tropicalis NOT DETECTED NOT DETECTED Final    Comment: Performed at [facility], [address] [place], [place] [address]  Culture, blood (Routine X 2) w Reflex to ID Panel     Status: Abnormal   Collection Time: 08/20/19  1:19 PM   Specimen: BLOOD LEFT WRIST  Result Value Ref Range Status    Specimen Description BLOOD LEFT WRIST  Final   Special Requests   Final    BOTTLES DRAWN AEROBIC AND ANAEROBIC Blood Culture adequate volume   Culture  Setup Time   Final    GRAM NEGATIVE RODS IN BOTH AEROBIC AND ANAEROBIC BOTTLES  CRITICAL RESULT CALLED TO, READ BACK BY AND VERIFIED WITH: PHARMD E MARTIN [id number] AT 1258 BY CM    Culture (A)  Final    KLEBSIELLA PNEUMONIAE SUSCEPTIBILITIES PERFORMED ON PREVIOUS CULTURE WITHIN THE LAST 5 DAYS. Performed at [facility], [place] [address] [place], [place] [address]    Report Status 08/23/2019 FINAL  Final  Culture, Urine     Status: Abnormal   Collection Time: 08/20/19  1:57 PM   Specimen: Urine, Clean Catch  Result Value Ref Range Status   Specimen Description URINE, CLEAN CATCH  Final   Special Requests NONE  Final   Culture (A)  Final    <10,000 COLONIES/mL INSIGNIFICANT GROWTH Performed at [facility], [place] [address], [place], [place] [address]    Report Status 08/21/2019 FINAL  Final  SARS Coronavirus 2 by RT PCR (hospital order, performed in [facility] hospital lab) Nasopharyngeal Nasopharyngeal Swab     Status: None   Collection Time: 08/20/19  2:26 PM   Specimen: Nasopharyngeal Swab  Result Value Ref Range Status   SARS Coronavirus 2 NEGATIVE NEGATIVE Final    Comment: (NOTE) If result is NEGATIVE SARS-CoV-2 target nucleic acids are NOT DETECTED. The SARS-CoV-2 RNA is generally detectable in upper and lower  respiratory specimens during the acute phase of infection. The lowest  concentration of SARS-CoV-2 viral copies this assay can detect is 250  copies / mL. A negative result does not preclude SARS-CoV-2 infection  and should not be used as the sole basis for treatment or other  patient management decisions.  A negative result may occur with  improper specimen collection / handling, submission of specimen other  than nasopharyngeal swab, presence of viral mutation(s) within the  areas targeted by this assay,  and inadequate number of viral copies  (<250 copies / mL). A negative result must be combined with clinical  observations, patient history, and epidemiological information. If result is POSITIVE SARS-CoV-2 target nucleic acids are DETECTED. The SARS-CoV-2 RNA is generally detectable in upper and lower  respiratory specimens dur ing the acute phase of infection.  Positive  results are indicative of active infection with SARS-CoV-2.  Clinical  correlation with patient history and other diagnostic information is  necessary to determine patient infection status.  Positive results do  not rule out bacterial infection or co-infection with other viruses. If result is PRESUMPTIVE POSTIVE SARS-CoV-2 nucleic acids MAY BE PRESENT.   A presumptive positive result was obtained on the submitted specimen  and confirmed on repeat testing.  While 2019 novel coronavirus  (SARS-CoV-2) nucleic acids may be present in the submitted sample  additional confirmatory testing may be necessary for epidemiological  and / or clinical management purposes  to differentiate between  SARS-CoV-2 and other Sarbecovirus currently known to infect humans.  If clinically indicated additional testing with an alternate test  methodology [phone]) is advised. The SARS-CoV-2 RNA is generally  detectable in upper and lower respiratory sp ecimens during the acute  phase of infection. The expected result is Negative. Fact Sheet for Patients:  StrictlyIdeas.no Fact Sheet for Healthcare Providers: BankingDealers.co.za This test is not yet approved or cleared by the [place] FDA and has been authorized for detection and/or diagnosis of SARS-CoV-2 by FDA under an Emergency Use Authorization (EUA).  This EUA will remain in effect (meaning this test can be used) for the duration of the COVID-19 declaration under Section 564(b)(1) of the Act, 21 U.S.C. section 360bbb-3(b)(1), unless  the authorization  is terminated or revoked sooner. Performed at [facility], [place] [address], [place], [place] [address]   Surgical pcr screen     Status: Abnormal   Collection Time: 08/21/19  4:39 PM   Specimen: Nasal Mucosa; Nasal Swab  Result Value Ref Range Status   MRSA, PCR NEGATIVE NEGATIVE Final   Staphylococcus aureus POSITIVE (A) NEGATIVE Final    Comment: (NOTE) The Xpert SA Assay (FDA approved for NASAL specimens in patients 62 years of age and older), is one component of a comprehensive surveillance program. It is not intended to diagnose infection nor to guide or monitor treatment. Performed at [facility], [place] [address] [place], [place] [address]   Culture, blood (routine x 2)     Status: None (Preliminary result)   Collection Time: 08/23/19 11:15 AM   Specimen: BLOOD LEFT ARM  Result Value Ref Range Status   Specimen Description BLOOD LEFT ARM  Final   Special Requests   Final    BOTTLES DRAWN AEROBIC ONLY Blood Culture adequate volume   Culture   Final    NO GROWTH < 24 HOURS Performed at [facility], [place] [address] [place], [place] [address]    Report Status PENDING  Incomplete  Culture, blood (routine x 2)     Status: None (Preliminary result)   Collection Time: 08/23/19 11:16 AM   Specimen: BLOOD  Result Value Ref Range Status   Specimen Description BLOOD LEFT ANTECUBITAL  Final   Special Requests   Final    BOTTLES DRAWN AEROBIC ONLY Blood Culture adequate volume   Culture   Final    NO GROWTH < 24 HOURS Performed at [facility], [place] [address], [place], [place] [address]    Report Status PENDING  Incomplete         Radiology Studies: No results found.      Scheduled Meds: . acetaminophen  650 mg Oral Q6H  . Chlorhexidine Gluconate Cloth  6 each Topical Daily  . mupirocin ointment  1 application Nasal BID   Continuous Infusions: . cefTRIAXone (ROCEPHIN)  IV 2 g (08/24/19 1336)     LOS: 6 days     Time spent: 35-40 min    Ezekiel Slocumb, DO [facility] Pager: [phone]  If 7PM-7AM, please contact night-coverage [email or web address] Password [email or web address] 08/24/2019, 2:22 PM

## 2019-08-24 NOTE — Consult Note (Signed)
   [facility] CM Inpatient Consult   08/24/2019  Vishwa Dais May 27, 1951 [id number]   Referral source: inpatient TOC LCSW  Thank you for referral request on 08/22/2019 patient was in procedure.  Referral was for post hospital follow up and assistance with medical management needs for post hospital needs.  Call attempts to patient's room and also to patient's phone number [phone]  listed in Epic with both without success to assess for specific needs.  3 pm:  Another call attempt made without success.    Plan: Continue to follow for request needs.  Will update referral source of outreach.  Natividad Brood, RN BSN [facility] Liaison  [phone] business mobile phone Toll free office [phone]  Fax number: [phone] [email or web address]

## 2019-08-25 LAB — CBC WITH DIFFERENTIAL/PLATELET
Abs Immature Granulocytes: 0.76 10*3/uL — ABNORMAL HIGH (ref 0.00–0.07)
Basophils Absolute: 0 10*3/uL (ref 0.0–0.1)
Basophils Relative: 0 %
Eosinophils Absolute: 0.1 10*3/uL (ref 0.0–0.5)
Eosinophils Relative: 1 %
HCT: 28 % — ABNORMAL LOW (ref 39.0–52.0)
Hemoglobin: 9.7 g/dL — ABNORMAL LOW (ref 13.0–17.0)
Immature Granulocytes: 5 %
Lymphocytes Relative: 11 %
Lymphs Abs: 1.5 10*3/uL (ref 0.7–4.0)
MCH: 34.5 pg — ABNORMAL HIGH (ref 26.0–34.0)
MCHC: 34.6 g/dL (ref 30.0–36.0)
MCV: 99.6 fL (ref 80.0–100.0)
Monocytes Absolute: 1 10*3/uL (ref 0.1–1.0)
Monocytes Relative: 7 %
Neutro Abs: 11.3 10*3/uL — ABNORMAL HIGH (ref 1.7–7.7)
Neutrophils Relative %: 76 %
Platelets: 170 10*3/uL (ref 150–400)
RBC: 2.81 MIL/uL — ABNORMAL LOW (ref 4.22–5.81)
RDW: 12.6 % (ref 11.5–15.5)
WBC: 14.7 10*3/uL — ABNORMAL HIGH (ref 4.0–10.5)
nRBC: 0 % (ref 0.0–0.2)

## 2019-08-25 LAB — COMPREHENSIVE METABOLIC PANEL
ALT: 55 U/L — ABNORMAL HIGH (ref 0–44)
AST: 50 U/L — ABNORMAL HIGH (ref 15–41)
Albumin: 2.1 g/dL — ABNORMAL LOW (ref 3.5–5.0)
Alkaline Phosphatase: 126 U/L (ref 38–126)
Anion gap: 7 (ref 5–15)
BUN: 8 mg/dL (ref 8–23)
CO2: 24 mmol/L (ref 22–32)
Calcium: 7.7 mg/dL — ABNORMAL LOW (ref 8.9–10.3)
Chloride: 107 mmol/L (ref 98–111)
Creatinine, Ser: 0.89 mg/dL (ref 0.61–1.24)
GFR calc Af Amer: >60 mL/min (ref >60)
GFR calc non Af Amer: >60 mL/min (ref >60)
Glucose, Bld: 103 mg/dL — ABNORMAL HIGH (ref 70–99)
Potassium: 4 mmol/L (ref 3.5–5.1)
Sodium: 138 mmol/L (ref 135–145)
Total Bilirubin: 1.6 mg/dL — ABNORMAL HIGH (ref 0.3–1.2)
Total Protein: 4.9 g/dL — ABNORMAL LOW (ref 6.5–8.1)

## 2019-08-25 MED ORDER — ACETAMINOPHEN 325 MG PO TABS: 650.0000 mg | ORAL_TABLET | Freq: Four times a day (QID) | ORAL | 1 refills | Status: AC

## 2019-08-25 MED ORDER — AMOXICILLIN-POT CLAVULANATE 875-125 MG PO TABS
1.0000 | ORAL_TABLET | Freq: Two times a day (BID) | ORAL | Status: DC
  Administered 2019-08-25: 1 via ORAL
  Filled 2019-08-25: qty 1

## 2019-08-25 MED ORDER — AMOXICILLIN-POT CLAVULANATE 875-125 MG PO TABS: 1.0000 | ORAL_TABLET | Freq: Two times a day (BID) | ORAL | 0 refills | Status: AC

## 2019-08-25 MED ORDER — OXYCODONE HCL 5 MG PO TABS: 5.0000 mg | ORAL_TABLET | Freq: Four times a day (QID) | ORAL | 0 refills | Status: AC | PRN

## 2019-08-25 NOTE — Progress Notes (Signed)
Physical Therapy Treatment Patient Details Name: Joseph Orr MRN: [id number] DOB: [birth year] Today's Date: 08/25/2019    History of Present Illness Pt is a 68 y/o male admitted secondary to nausea and chest pain. Found to have cholecystitis. Pt is s/p ERCP and s/p lap chole. PMH includes CVA, gout, and GERD.     PT Comments    Pt making steady progress with mobility. Initiated gait training with use of a cane this session. Limited secondary to fluid drainage from jp drain site. RN was notified and aware. Pt would continue to benefit from skilled physical therapy services at this time while admitted and after d/c to address the below listed limitations in order to improve overall safety and independence with functional mobility.    Follow Up Recommendations  Outpatient PT     Equipment Recommendations  None recommended by PT;Other (comment)(pt reporting he will purchase his own cane)    Recommendations for Other Services       Precautions / Restrictions Precautions Precautions: None Restrictions Weight Bearing Restrictions: No    Mobility  Bed Mobility Overal bed mobility: Modified Independent             General bed mobility comments: pt sitting EOB upon arrival  Transfers Overall transfer level: Needs assistance Equipment used: None;Straight cane Transfers: Sit to/from Stand Sit to Stand: Min guard;Supervision         General transfer comment: increased time, very steady with transitions  Ambulation/Gait Ambulation/Gait assistance: Min guard Gait Distance (Feet): 50 Feet Assistive device: Straight cane Gait Pattern/deviations: Step-through pattern;Decreased step length - right;Decreased step length - left;Decreased stride length Gait velocity: Decreased   General Gait Details: pt with very slow, cautious and guarded gait with and without straight cane; pt's drain site with drainage dripping during walk, therefore distance limited and RN  notified   Stairs             Wheelchair Mobility    Modified Rankin (Stroke Patients Only)       Balance Overall balance assessment: Needs assistance Sitting-balance support: No upper extremity supported;Feet supported Sitting balance-Leahy Scale: Good     Standing balance support: During functional activity;No upper extremity supported;Single extremity supported Standing balance-Leahy Scale: Poor Standing balance comment: static standing balance is fair with supervision and poor dynamic with min guard                            Cognition Arousal/Alertness: Awake/alert Behavior During Therapy: WFL for tasks assessed/performed Overall Cognitive Status: Within Functional Limits for tasks assessed                                        Exercises      General Comments        Pertinent Vitals/Pain Pain Assessment: No/denies pain Pain Score: 2  Pain Descriptors / Indicators: Sore Pain Intervention(s): Monitored during session;Repositioned    Home Living Family/patient expects to be discharged to:: Private residence Living Arrangements: Spouse/significant other Available Help at Discharge: Family Type of Home: House Home Access: Stairs to enter Entrance Stairs-Rails: None Home Layout: One level Home Equipment: None      Prior Function Level of Independence: Independent          PT Goals (current goals can now be found in the care plan section) Acute Rehab PT Goals Patient Stated Goal: to  go home PT Goal Formulation: With patient Time For Goal Achievement: 09/07/19 Potential to Achieve Goals: Good Progress towards PT goals: Progressing toward goals    Frequency    Min 3X/week      PT Plan Current plan remains appropriate    Co-evaluation              AM-PAC PT "6 Clicks" Mobility   Outcome Measure  Help needed turning from your back to your side while in a flat bed without using bedrails?: None Help  needed moving from lying on your back to sitting on the side of a flat bed without using bedrails?: None Help needed moving to and from a bed to a chair (including a wheelchair)?: A Little Help needed standing up from a chair using your arms (e.g., wheelchair or bedside chair)?: A Little Help needed to walk in hospital room?: A Little Help needed climbing 3-5 steps with a railing? : A Little 6 Click Score: 20    End of Session Equipment Utilized During Treatment: Gait belt;Other (comment)(belt positioned high on chest to avoid JP drain site) Activity Tolerance: Patient limited by fatigue Patient left: in bed;with call bell/phone within reach Nurse Communication: Mobility status;Other (comment)(drainage from jp drain) PT Visit Diagnosis: Muscle weakness (generalized) (M62.81);Unsteadiness on feet (R26.81)     Time: 6568-1275 PT Time Calculation (min) (ACUTE ONLY): 14 min  Charges:  $Gait Training: 8-22 mins                     Sherie Don, [place], DPT  Acute Rehabilitation Services Pager [phone] Office [place] 08/25/2019, 1:29 PM

## 2019-08-25 NOTE — Progress Notes (Signed)
Patient discharged to home. Verbalizes understanding of all discharge instructions including incision/drain care, discharge medications, and follow up MD visits.  

## 2019-08-25 NOTE — Progress Notes (Signed)
[facility] [place] [facility]/Trauma Progress Note  3 Days Post-Op   Assessment/Plan GOUT GERD CVA 02/2019 - per neurology, okay for surgery and to hold plavix, resume plavix as soon as possible postop  Choledocholithiasis and cholecystitis - CBDcleared via ERCP 10/18 -s/p laparoscopic subtotal (stapled) cholecystectomy, Dr. Kae Heller, 10/19 - continue JP drain - blood cultures from 10/20 show NGTD  FEN:reg diet VTE: SCD's,hold lovenox for now AY:[id number] 10/17>>  Antibiotics for bacteremia per medicine or ID Foley:none Follow up:[facility] 2 weeks  Plan: Hgb stable, okay for discharge from a surgical standpoint. F/U in AVS   LOS: 7 days    Subjective: CC: no complaints  Pt states he slept well. Mild intermittent RUQ abdominal pain. No issues overnight. No nausea or vomiting.   Objective: Vital signs in last 24 hours: Temp:  [98.3 F (36.8 C)-99 F (37.2 C)] 99 F (37.2 C) (10/22 0636) Pulse Rate:  [70-76] 76 (10/22 0636) Resp:  [16-18] 18 (10/22 0636) BP: (137-152)/(75-81) 139/81 (10/22 0636) SpO2:  [100 %] 100 % (10/22 0636) Last BM Date: 08/24/19  Intake/Output from previous day: 10/21 0701 - 10/22 0700 In: 480 [P.O.:480] Out: 30 [Drains:30] Intake/Output this shift: No intake/output data recorded.  PE: Gen: Alert, NAD, pleasant, cooperative Pulm:Rate andeffort normal Abd: Soft, ND, incisionswith glue intact are well appearing, drain with serosanguinous drainage,very mild TTP RUQ without guarding, no peritonitis  Skin: no rashes noted, warm and dry  Anti-infectives: Anti-infectives (From admission, onward)   Start     Dose/Rate Route Frequency Ordered Stop   08/21/19 1400  cefTRIAXone (ROCEPHIN) 2 g in sodium chloride 0.9 % 100 mL IVPB     2 g 200 mL/hr over 30 Minutes Intravenous Every 24 hours 08/21/19 1306     08/21/19 1400  metroNIDAZOLE (FLAGYL) IVPB 500 mg  Status:  Discontinued     500 mg 100 mL/hr over 60 Minutes Intravenous Every 8 hours  08/21/19 1310 08/23/19 1027   08/20/19 1400  piperacillin-tazobactam (ZOSYN) IVPB 3.375 g  Status:  Discontinued     3.375 g 12.5 mL/hr over 240 Minutes Intravenous Every 8 hours 08/20/19 1217 08/21/19 1306      Lab Results:  Recent Labs    08/24/19 0300 08/25/19 0232  WBC 11.5* 14.7*  HGB 9.6* 9.7*  HCT 27.4* 28.0*  PLT 129* 170   BMET Recent Labs    08/24/19 0300 08/25/19 0232  NA 137 138  K 4.2 4.0  CL 108 107  CO2 20* 24  GLUCOSE 93 103*  BUN 12 8  CREATININE 1.03 0.89  CALCIUM 7.5* 7.7*   PT/INR No results for input(s): LABPROT, INR in the last 72 hours. CMP     Component Value Date/Time   NA 138 08/25/2019 0232   K 4.0 08/25/2019 0232   CL 107 08/25/2019 0232   CO2 24 08/25/2019 0232   GLUCOSE 103 (H) 08/25/2019 0232   BUN 8 08/25/2019 0232   CREATININE 0.89 08/25/2019 0232   CALCIUM 7.7 (L) 08/25/2019 0232   PROT 4.9 (L) 08/25/2019 0232   ALBUMIN 2.1 (L) 08/25/2019 0232   AST 50 (H) 08/25/2019 0232   ALT 55 (H) 08/25/2019 0232   ALKPHOS 126 08/25/2019 0232   BILITOT 1.6 (H) 08/25/2019 0232   GFRNONAA >60 08/25/2019 0232   GFRAA >60 08/25/2019 0232   Lipase     Component Value Date/Time   LIPASE 18 08/20/2019 1321    Studies/Results: No results found.   Kalman Drape, [facility] Pager [phone]  Cristine Polio, & Friday 7:00am - 4:30pm Thursdays 7:00am -11:30am

## 2019-08-25 NOTE — TOC Initial Note (Signed)
Transition of Care [facility]) - Initial/Assessment Note    Patient Details  Name: Joseph Orr MRN: [id number] Date of Birth: 10-08-1951  Transition of Care [facility]) CM/SW Contact:    Marilu Favre, RN Phone Number: 08/25/2019, 11:21 AM  Clinical Narrative:                 Patient from home with family. Arranged OP PT with Neuro Rehab information provided. PT recommending a cane. Patient states his family is getting him a cane. NCM offered to order cane, patient declined.  Patient wanting to change PCP. Explained he can call number on insurance card and be provided a list of MD in network and chose from there. Patient voiced understanding.  Expected Discharge Plan: Home/Self Care Barriers to Discharge: Continued Medical Work up   Patient Goals and CMS Choice Patient states their goals for this hospitalization and ongoing recovery are:: to go home CMS Medicare.gov Compare Post Acute Care list provided to:: Patient Choice offered to / list presented to : Patient  Expected Discharge Plan and Services Expected Discharge Plan: Home/Self Care In-house Referral: Clinical Social Work, PCP / Psychologist, educational, [facility] Discharge Planning Services: CM Consult Post Acute Care Choice: Durable Medical Equipment Living arrangements for the past 2 months: Single Family Home Expected Discharge Date: 08/25/19               DME Arranged: Cane(refused)         HH Arranged: NA          Prior Living Arrangements/Services Living arrangements for the past 2 months: Single Family Home Lives with:: Relatives Patient language and need for interpreter reviewed:: Yes Do you feel safe going back to the place where you live?: Yes      Need for Family Participation in Patient Care: Yes (Comment) Care giver support system in place?: Yes (comment) Current home services: DME Criminal Activity/Legal Involvement Pertinent to Current Situation/Hospitalization: No - Comment as needed  Activities of  Daily Living Home Assistive Devices/Equipment: Eyeglasses ADL Screening (condition at time of admission) Patient's cognitive ability adequate to safely complete daily activities?: Yes Is the patient deaf or have difficulty hearing?: No Does the patient have difficulty seeing, even when wearing glasses/contacts?: No Does the patient have difficulty concentrating, remembering, or making decisions?: No Patient able to express need for assistance with ADLs?: Yes Does the patient have difficulty dressing or bathing?: No Independently performs ADLs?: Yes (appropriate for developmental age) Does the patient have difficulty walking or climbing stairs?: No Weakness of Legs: None Weakness of Arms/Hands: None  Permission Sought/Granted Permission sought to share information with : Family Supports Permission granted to share information with : No  Share Information with NAME: Joseph Orr; Joseph Orr  Permission granted to share info w AGENCY: [place] granted to share info w Relationship: spouse; daughter  Permission granted to share info w Contact Information: [phone]  Emotional Assessment Appearance:: Appears stated age Attitude/Demeanor/Rapport: Engaged Affect (typically observed): Accepting Orientation: : Oriented to  Time, Oriented to Situation, Oriented to Place, Oriented to Self Alcohol / Substance Use: Not Applicable Psych Involvement: No (comment)  Admission diagnosis:  cholesytitis Patient Active Problem List   Diagnosis Date Noted  . Bacteremia due to Klebsiella pneumoniae 08/24/2019  . Cholangitis   . Choledocholithiasis with acute cholecystitis   . RUQ abdominal pain 08/18/2019  . History of CVA (cerebrovascular accident) 08/18/2019  . Abdominal pain, epigastric 08/17/2019  . Nausea and vomiting 08/17/2019  . Abnormal gallbladder ultrasound 08/17/2019  .  Other constipation 08/17/2019  . Left sided lacunar infarction (HCC) 04/04/2019  . COPD (chronic  obstructive pulmonary disease) (HCC) 04/06/2018  . GERD (gastroesophageal reflux disease) 04/06/2018  . Hyperlipidemia 07/21/2017  . Gouty arthritis 06/03/2016  . Osteoarthritis of knee 06/03/2016   PCP:  [place], Betty G, MD Pharmacy:   [facility] [address], [place] - [address] [place] [address] Phone: [phone] Fax: [phone]     Social Determinants of Health (SDOH) Interventions    Readmission Risk Interventions Readmission Risk Prevention Plan 08/19/2019  Post Dischage Appt Not Complete  Appt Comments pt in process of changing PCP's  Medication Screening Complete  Transportation Screening Complete  Some recent data might be hidden

## 2019-08-25 NOTE — Discharge Summary (Signed)
Physician Discharge Summary  Joseph Orr NAT:[id number] DOB: Apr 09, 1951 DOA: 08/18/2019  PCP: Martinique, Betty G, MD  Admit date: 08/18/2019 Discharge date: 08/25/2019  Admitted From: Home Disposition:  Home  Recommendations for Outpatient Follow-up:  1. Follow up with PCP in 1-2 weeks 2. Please obtain BMP/CBC in one week 3. 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  Heinitially presented toED 08/15/2019 with c/o chest/abdominal pain -labs notable for AST 75, ALT 35, alk phos 90, total bilirubin 1.9. Trop unremarkable.CTA chest/abdomen/pelvis dissection study was obtained which was negative for aortic dissection or aneurysm. Mild pericholecystic haziness with possible gallbladder sludge was noted. He was discharged home withrecommendations forGI follow-up. OutpatientRUQ ultrasound 08/17/2019 showed minimal cholelithiasis with large amount of sludge, CBD diameter 8 mm. GI referred to Gen surgery.  He was seen by Dr. Redmond Pulling with [facility] surgery who recommendedadmission to hospitalist servicefor inpatient cholecystectomy afterneurological evaluation given recent stroke vspercutaneous drain by IR if felt to be high risk.   Hospital course: Seen by GI/GS- underwentERCP 10/18 revealingpus in the ampulla, stones removed.  Neurology cleared patient for surgical intervention. Underwent lap chole on 08/22/19 with placement of JP drain postoperatively.  Course was complicated by AKI and Klebsiella bacteremia.Antibiotics descalated, to Rocephin monotherapy (sp  zosyn-->rocephin/flagyl).  Repeat blood cultures with no growth x 48 hours today.  Transition to PO Augmentin upon discharge to complete a 10 day course, total.  Plavix resumed on discharge as neurology recommended resuming as soon as possible following surgery.  Patient clinically stable and improved for discharge home today with surgery follow up in two weeks and outpatient PT with Neuro Rehab.     Discharge Diagnoses: Active Problems:   Hyperlipidemia   GERD (gastroesophageal reflux disease)   RUQ abdominal pain   History of CVA (cerebrovascular accident)   Cholangitis   Choledocholithiasis with acute cholecystitis   Bacteremia due to Klebsiella pneumoniae    Discharge Instructions please write down JP drain output each time you empty drain and bring to surgery follow up appointment.  Discharge Instructions    AMB Referral to [facility]   Complete by: As directed    Please read:  Patient does not want any calls until next week.  Patient desires a new primary care provider and has asked inpatient TOC team for [facility] follow up.  [employer] Medicare and wants in network.   Please assign to community nurse for complex care and disease management follow up calls and assess for further needs.  Questions please call:   Natividad Brood, RN BSN [facility] Liaison  [phone] business mobile phone Toll free office [phone]  Fax number: [phone] [email or web address]   Reason for consult: Transition of care   Diagnoses of:  Other COPD/ Pneumonia     Other Diagnosis: Post op cholangitis   Expected date of contact: 1-3 days (reserved for hospital discharges)   Ambulatory referral to Physical Therapy   Complete by: As directed    Iontophoresis - 4 mg/ml of dexamethasone: No   T.E.N.S. Unit Evaluation and Dispense as Indicated: No   Call MD for:  persistant nausea and vomiting  Complete by: As directed     Call MD for:  redness, tenderness, or signs of infection (pain, swelling, redness, odor or green/yellow discharge around incision site)   Complete by: As directed    Call MD for:  temperature >100.4   Complete by: As directed    Diet - low sodium heart healthy   Complete by: As directed    Discharge instructions   Complete by: As directed    Monitor JP drain output, write down how much fluid each time you empty the drain, bring to surgery follow up appointment. Take Antibiotics (Augmentin) twice daily for 6 more days.   Increase activity slowly   Complete by: As directed      Allergies as of 08/25/2019      Reactions   Oxycodone-acetaminophen Nausea And Vomiting   Atorvastatin Nausea And Vomiting   Rosuvastatin Nausea And Vomiting      Medication List    TAKE these medications   acetaminophen 325 MG tablet Commonly known as: TYLENOL Take 2 tablets (650 mg total) by mouth every 6 (six) hours. What changed:   medication strength  how much to take  when to take this   albuterol 108 (90 Base) MCG/ACT inhaler Commonly known as: VENTOLIN HFA Inhale 2 puffs into the lungs every 4 (four) hours as needed for wheezing or shortness of breath.   allopurinol 100 MG tablet Commonly known as: ZYLOPRIM TAKE 1 TABLET BY MOUTH DAILY What changed: when to take this   amoxicillin-clavulanate 875-125 MG tablet Commonly known as: AUGMENTIN Take 1 tablet by mouth every 12 (twelve) hours for 6 days.   cetirizine 10 MG tablet Commonly known as: ZYRTEC Take 10 mg by mouth every morning.   clopidogrel 75 MG tablet Commonly known as: PLAVIX TAKE 1 TABLET BY MOUTH DAILY What changed: when to take this   cyanocobalamin 2000 MCG tablet Take 2,000 mcg by mouth every morning.   ondansetron 4 MG tablet Commonly known as: ZOFRAN Take 1 tablet (4 mg total) by mouth every 6 (six) hours. What changed: when to take this   oxyCODONE 5 MG immediate release tablet Commonly known as: Oxy  IR/ROXICODONE Take 1 tablet (5 mg total) by mouth every 6 (six) hours as needed for up to 3 days for severe pain or breakthrough pain (for pain not releived by Tylenol).   pantoprazole 20 MG tablet Commonly known as: Protonix Take 1 tablet (20 mg total) by mouth daily. What changed: when to take this   prednisoLONE acetate 1 % ophthalmic suspension Commonly known as: PRED FORTE Place 1 drop into the right eye daily.      Follow-up Information    [facility] Physician Referral Line. Call in 2 week(s).   Why: The [facility] Referral Line is available to assist with finding a [facility] Provider that accepts [facility] Otherwise your customer service line for [facility] may also be able to accept. Please f/u within at least 2 weeks of discharge.  Contact information:  [facility], [place]. Go on 08/31/2019.   Specialty: General Surgery Why: at 2:00pm for drain check and possible removal. please arrive 30 minutes prior to complete paperwork. Please bring photo ID and insurance card Contact information: [address] [place] [phone]       [facility], [place]. Go on 09/13/2019.   Specialty: General Surgery Why: at 8:45am for follow up. please arrive 15 minutes  prior to complete paperwork. Please bring photo ID and insurance card Contact information: [address] [place] [address] [place] [facility] up.   Specialty: Rehabilitation Contact information: [place] [phone] [place] [address] [phone]         Allergies  Allergen Reactions  . Oxycodone-Acetaminophen Nausea And Vomiting  . Atorvastatin Nausea And Vomiting  . Rosuvastatin Nausea And Vomiting    Consultations:  General Surgery  Gastroenterology  Neurology   Procedures/Studies: Ct Angio Head W  Or Wo Contrast  Result Date: 08/19/2019 CLINICAL DATA:  Focal neurological deficit EXAM: CT ANGIOGRAPHY HEAD AND NECK TECHNIQUE: Multidetector CT imaging of the head and neck was performed using the standard protocol during bolus administration of intravenous contrast. Multiplanar CT image reconstructions and MIPs were obtained to evaluate the vascular anatomy. Carotid stenosis measurements (when applicable) are obtained utilizing NASCET criteria, using the distal internal carotid diameter as the denominator. CONTRAST:  185m OMNIPAQUE IOHEXOL 350 MG/ML SOLN COMPARISON:  MRI 03/15/2019 FINDINGS: CT HEAD FINDINGS Brain: Generalized atrophy. No focal abnormality seen affecting the brainstem or cerebellum. Cerebral hemispheres show an old infarction of the left thalamus no other focal finding. No mass lesion, hemorrhage, hydrocephalus or extra-axial collection. Vascular: There is atherosclerotic calcification of the major vessels at the base of the brain. Skull: Negative Sinuses: Left maxillary sinus inflammatory changes. Other sinuses clear. Orbits: Negative Review of the MIP images confirms the above findings CTA NECK FINDINGS Aortic arch: Aortic atherosclerosis. No aneurysm or dissection. Branching pattern is normal without origin stenosis. Right carotid system: Common carotid artery is widely patent to the bifurcation region. There is calcified plaque at the carotid bifurcation and ICA bulb. Minimal diameter is 4.5 mm, the same as the more distal cervical ICA, therefore no stenosis. Left carotid system: Common carotid artery widely patent to the bifurcation region. Calcified plaque at the carotid bifurcation and ICA bulb. Minimal diameter in the ICA bulb measures 2.7 mm. Compared to a more distal cervical ICA diameter 4.4 mm, this indicates a 40% stenosis. Vertebral arteries: Calcified plaque at both vertebral artery origins. No stenosis on the right. 30% stenosis on the left. Beyond that, both vertebral  arteries are widely patent through the cervical region to foramen magnum. Skeleton: Mild cervical spondylosis. Other neck: No mass or lymphadenopathy. Upper chest: Negative Review of the MIP images confirms the above findings CTA HEAD FINDINGS Anterior circulation: Both internal carotid arteries are patent through the skull base and siphon regions. There is atherosclerotic calcification throughout the carotid siphons with stenosis estimated at 50% on each side. The anterior and middle cerebral vessels are patent without proximal stenosis, aneurysm or vascular malformation. No large or medium vessel occlusion identified. Posterior circulation: Both vertebral arteries are patent through the foramen magnum to the basilar. Atherosclerotic irregularity of the V4 segments but no stenosis greater than 30%. No basilar stenosis. Superior cerebellar and posterior cerebral arteries show flow on both sides. Venous sinuses: Patent and normal. Anatomic variants: None significant. Review of the MIP images confirms the above findings IMPRESSION: 1. Aortic atherosclerosis. No aneurysm or dissection. 2. Atherosclerotic disease at both carotid bifurcations but without measurable stenosis on the right. 40% stenosis of the ICA bulb on the left. 3. Atherosclerotic disease throughout the carotid siphons with stenosis estimated at 50% on each side. 4. No intracranial large or medium vessel occlusion or correctable proximal stenosis. 5. 30% stenosis of the left vertebral  artery origin. Aortic Atherosclerosis (ICD10-I70.0). Electronically Signed   By: Nelson Chimes M.D.   On: 08/19/2019 14:42   Dg Chest 2 View  Result Date: 08/15/2019 CLINICAL DATA:  Chest pain EXAM: CHEST - 2 VIEW COMPARISON:  08/10/2018 FINDINGS: Cardiac silhouette is upper limits of normal in size. Calcific aortic knob. No focal airspace consolidation, pleural effusion, or pneumothorax. IMPRESSION: Borderline cardiomegaly.  No acute findings. Electronically Signed    By: Davina Poke M.D.   On: 08/15/2019 15:27   Ct Angio Neck W Or Wo Contrast  Result Date: 08/19/2019 CLINICAL DATA:  Focal neurological deficit EXAM: CT ANGIOGRAPHY HEAD AND NECK TECHNIQUE: Multidetector CT imaging of the head and neck was performed using the standard protocol during bolus administration of intravenous contrast. Multiplanar CT image reconstructions and MIPs were obtained to evaluate the vascular anatomy. Carotid stenosis measurements (when applicable) are obtained utilizing NASCET criteria, using the distal internal carotid diameter as the denominator. CONTRAST:  118m OMNIPAQUE IOHEXOL 350 MG/ML SOLN COMPARISON:  MRI 03/15/2019 FINDINGS: CT HEAD FINDINGS Brain: Generalized atrophy. No focal abnormality seen affecting the brainstem or cerebellum. Cerebral hemispheres show an old infarction of the left thalamus no other focal finding. No mass lesion, hemorrhage, hydrocephalus or extra-axial collection. Vascular: There is atherosclerotic calcification of the major vessels at the base of the brain. Skull: Negative Sinuses: Left maxillary sinus inflammatory changes. Other sinuses clear. Orbits: Negative Review of the MIP images confirms the above findings CTA NECK FINDINGS Aortic arch: Aortic atherosclerosis. No aneurysm or dissection. Branching pattern is normal without origin stenosis. Right carotid system: Common carotid artery is widely patent to the bifurcation region. There is calcified plaque at the carotid bifurcation and ICA bulb. Minimal diameter is 4.5 mm, the same as the more distal cervical ICA, therefore no stenosis. Left carotid system: Common carotid artery widely patent to the bifurcation region. Calcified plaque at the carotid bifurcation and ICA bulb. Minimal diameter in the ICA bulb measures 2.7 mm. Compared to a more distal cervical ICA diameter 4.4 mm, this indicates a 40% stenosis. Vertebral arteries: Calcified plaque at both vertebral artery origins. No stenosis on the  right. 30% stenosis on the left. Beyond that, both vertebral arteries are widely patent through the cervical region to foramen magnum. Skeleton: Mild cervical spondylosis. Other neck: No mass or lymphadenopathy. Upper chest: Negative Review of the MIP images confirms the above findings CTA HEAD FINDINGS Anterior circulation: Both internal carotid arteries are patent through the skull base and siphon regions. There is atherosclerotic calcification throughout the carotid siphons with stenosis estimated at 50% on each side. The anterior and middle cerebral vessels are patent without proximal stenosis, aneurysm or vascular malformation. No large or medium vessel occlusion identified. Posterior circulation: Both vertebral arteries are patent through the foramen magnum to the basilar. Atherosclerotic irregularity of the V4 segments but no stenosis greater than 30%. No basilar stenosis. Superior cerebellar and posterior cerebral arteries show flow on both sides. Venous sinuses: Patent and normal. Anatomic variants: None significant. Review of the MIP images confirms the above findings IMPRESSION: 1. Aortic atherosclerosis. No aneurysm or dissection. 2. Atherosclerotic disease at both carotid bifurcations but without measurable stenosis on the right. 40% stenosis of the ICA bulb on the left. 3. Atherosclerotic disease throughout the carotid siphons with stenosis estimated at 50% on each side. 4. No intracranial large or medium vessel occlusion or correctable proximal stenosis. 5. 30% stenosis of the left vertebral artery origin. Aortic Atherosclerosis (ICD10-I70.0). Electronically Signed   By: MElta Guadeloupe  Shogry M.D.   On: 08/19/2019 14:42   Dg Ercp Biliary & Pancreatic Ducts  Result Date: 08/21/2019 CLINICAL DATA:  Choledocholithiasis. EXAM: ERCP TECHNIQUE: Multiple spot images obtained with the fluoroscopic device and submitted for interpretation post-procedure. FLUOROSCOPY TIME:  Fluoroscopy Time:  2 minutes and 40  seconds Number of Acquired Spot Images: 3 COMPARISON:  MRCP 08/19/2019 FINDINGS: Retrograde cholangiogram raises concern for a filling defect in the distal common bile duct that would correspond with the previous MRCP. Questionable filling defect near the proximal common hepatic duct. Wire was advanced into the biliary system. No definite filling defects or stones on the final cholangiogram. IMPRESSION: Choledocholithiasis. These images were submitted for radiologic interpretation only. Please see the procedural report for the amount of contrast and the fluoroscopy time utilized. Electronically Signed   By: Markus Daft M.D.   On: 08/21/2019 09:19   Mr Abdomen Mrcp Moise Boring Contast  Result Date: 08/20/2019 CLINICAL DATA:  Upper abdominal pain, abnormal LFTs EXAM: MRI ABDOMEN WITHOUT AND WITH CONTRAST (INCLUDING MRCP) TECHNIQUE: Multiplanar multisequence MR imaging of the abdomen was performed both before and after the administration of intravenous contrast. Heavily T2-weighted images of the biliary and pancreatic ducts were obtained, and three-dimensional MRCP images were rendered by post processing. CONTRAST:  11m GADAVIST GADOBUTROL 1 MMOL/ML IV SOLN COMPARISON:  CTA abdomen/pelvis dated 08/15/2019. Right upper quadrant ultrasound dated 08/17/2019. FINDINGS: Lower chest: Lung bases are clear. Hepatobiliary: No morphologic findings of cirrhosis. Geographic altered perfusion along the gallbladder fossa. No suspicious/enhancing hepatic lesions. Hepatic steatosis. Cholelithiasis gallbladder wall thickening and pericholecystic inflammatory changes. No intrahepatic ductal dilatation. Common duct measures 7 mm proximally and 10 mm distally. Associated 8 mm distal CBD stone at the ampulla (series 14/image 9). Pancreas:  Within normal limits. Spleen:  Within normal limits. Adrenals/Urinary Tract:  Adrenal glands are within normal limits. Right kidney is within normal limits. Status post left nephrectomy. Stomach/Bowel:  Stomach is within normal limits. Visualized bowel is notable for scattered colonic diverticulosis. Vascular/Lymphatic:  No evidence of abdominal aortic aneurysm. No suspicious abdominal lymphadenopathy. Other:  No abdominal ascites. Musculoskeletal: No focal osseous lesions. IMPRESSION: Cholelithiasis with findings suspicious for acute cholecystitis. Dilated common duct, measuring up to 10 mm distally, with associated 8 mm distal CBD stone at the ampulla. These results will be called to the ordering clinician or representative by the Radiologist Assistant, and communication documented in the PACS or zVision Dashboard. Electronically Signed   By: SJulian HyM.D.   On: 08/20/2019 09:01   Ct Angio Chest/abd/pel For Dissection W And/or Wo Contrast  Result Date: 08/15/2019 CLINICAL DATA:  Evaluate for aortic dissection. Chest and back pain. EXAM: CT ANGIOGRAPHY CHEST, ABDOMEN AND PELVIS TECHNIQUE: Multidetector CT imaging through the chest, abdomen and pelvis was performed using the standard protocol during bolus administration of intravenous contrast. Multiplanar reconstructed images and MIPs were obtained and reviewed to evaluate the vascular anatomy. CONTRAST:  1068mOMNIPAQUE IOHEXOL 350 MG/ML SOLN COMPARISON:  None FINDINGS: CTA CHEST FINDINGS Cardiovascular: Normal heart size. No pericardial effusion. Aortic atherosclerosis. No thoracic aortic dissection or aneurysm identified. No central obstructing pulmonary embolus identified. Mediastinum/Nodes: No enlarged mediastinal, hilar, or axillary lymph nodes. Thyroid gland, trachea, and esophagus demonstrate no significant findings. Lungs/Pleura: No pleural effusion. No airspace consolidation, atelectasis or pneumothorax. Musculoskeletal: No chest wall abnormality. No acute or significant osseous findings. Review of the MIP images confirms the above findings. CTA ABDOMEN AND PELVIS FINDINGS VASCULAR Aorta: Normal caliber aorta without aneurysm, dissection,  vasculitis or significant stenosis.  Aortic atherosclerosis noted. Celiac: Patent without evidence of aneurysm, dissection, vasculitis or significant stenosis. SMA: Patent. Approximately 50% stenosis at the origin is identified. Renals: Right renal artery patent.  Left nephrectomy. IMA: Patent without evidence of aneurysm, dissection, vasculitis or significant stenosis. Inflow: Patent without evidence of aneurysm, dissection, vasculitis or significant stenosis. Veins: No obvious venous abnormality within the limitations of this arterial phase study. Review of the MIP images confirms the above findings. NON-VASCULAR Hepatobiliary: No focal liver abnormality. No biliary ductal dilatation. There is mild indistinctness of the gallbladder wall. Underlying gallbladder sludge suspected. Pancreas: Unremarkable. No pancreatic ductal dilatation or surrounding inflammatory changes. Spleen: Normal in size without focal abnormality. Adrenals/Urinary Tract: Normal appearance of the adrenal glands. Right kidney is unremarkable. No mass or hydronephrosis. Left nephrectomy. Urinary bladder is unremarkable. Stomach/Bowel: Stomach is within normal limits. Appendix appears normal. No evidence of bowel wall thickening, distention, or inflammatory changes. Colonic diverticulosis identified without acute inflammation. Lymphatic: No significant vascular findings are present. No enlarged abdominal or pelvic lymph nodes. Reproductive: Prostate is unremarkable. Other: No free fluid or fluid collections. Fat containing left inguinal hernia. Musculoskeletal: No acute or significant osseous findings. Review of the MIP images confirms the above findings. IMPRESSION: 1. No evidence for aortic dissection or aneurysm. 2. Mild pericholecystic haziness with possible gallbladder sludge. Recommend further evaluation with gallbladder sonogram. 3.  Aortic Atherosclerosis (ICD10-I70.0). Electronically Signed   By: Kerby Moors M.D.   On: 08/15/2019 18:38    Vas US Carotid  Result Date: 08/19/2019 Carotid Arterial Duplex Study Indications:       CVA. Comparison Study:  no prior Performing Technologist: Abram Sander RVS  Examination Guidelines: A complete evaluation includes B-mode imaging, spectral Doppler, color Doppler, and power Doppler as needed of all accessible portions of each vessel. Bilateral testing is considered an integral part of a complete examination. Limited examinations for reoccurring indications may be performed as noted.  Right Carotid Findings: +----------+--------+--------+--------+------------------+--------+           PSV cm/sEDV cm/sStenosisPlaque DescriptionComments +----------+--------+--------+--------+------------------+--------+ CCA Prox  83      16              heterogenous               +----------+--------+--------+--------+------------------+--------+ CCA Distal49      12              heterogenous               +----------+--------+--------+--------+------------------+--------+ ICA Prox  47      12      1-39%   heterogenous               +----------+--------+--------+--------+------------------+--------+ ICA Distal62      21                                         +----------+--------+--------+--------+------------------+--------+ ECA       102     9                                          +----------+--------+--------+--------+------------------+--------+ +----------+--------+-------+--------+-------------------+           PSV cm/sEDV cmsDescribeArm Pressure (mmHG) +----------+--------+-------+--------+-------------------+ Subclavian105                                        +----------+--------+-------+--------+-------------------+ +---------+--------+--+--------+--+---------+  VertebralPSV cm/s42EDV cm/s14Antegrade +---------+--------+--+--------+--+---------+  Left Carotid Findings: +----------+--------+--------+--------+------------------+--------+           PSV  cm/sEDV cm/sStenosisPlaque DescriptionComments +----------+--------+--------+--------+------------------+--------+ CCA Prox  87      16              heterogenous               +----------+--------+--------+--------+------------------+--------+ CCA Distal60      15              heterogenous               +----------+--------+--------+--------+------------------+--------+ ICA Prox  47      16      1-39%   heterogenous               +----------+--------+--------+--------+------------------+--------+ ICA Distal40      14                                         +----------+--------+--------+--------+------------------+--------+ ECA       89      14                                         +----------+--------+--------+--------+------------------+--------+ +----------+--------+--------+--------+-------------------+           PSV cm/sEDV cm/sDescribeArm Pressure (mmHG) +----------+--------+--------+--------+-------------------+ [id number]                                          +----------+--------+--------+--------+-------------------+ +---------+--------+--+--------+--+---------+ VertebralPSV cm/s36EDV cm/s10Antegrade +---------+--------+--+--------+--+---------+  Summary: Right Carotid: Velocities in the right ICA are consistent with a 1-39% stenosis. Left Carotid: Velocities in the left ICA are consistent with a 1-39% stenosis. Vertebrals:  Bilateral vertebral arteries demonstrate antegrade flow. Subclavians: Normal flow hemodynamics were seen in bilateral subclavian              arteries. *See table(s) above for measurements and observations.  Electronically signed by Antony Contras MD on 08/19/2019 at 2:06:15 PM.    Final    US Abdomen Limited Ruq  Result Date: 08/17/2019 CLINICAL DATA:  Upper abdominal pain. EXAM: ULTRASOUND ABDOMEN LIMITED RIGHT UPPER QUADRANT COMPARISON:  None. FINDINGS: Gallbladder: Minimal cholelithiasis and large amount of sludge  is noted within the gallbladder lumen. No gallbladder wall thickening or pericholecystic fluid is noted. No sonographic Murphy's sign is noted. Common bile duct: Diameter: 8 mm which is borderline dilated for age. Liver: No focal lesion identified. Within normal limits in parenchymal echogenicity. Portal vein is patent on color Doppler imaging with normal direction of blood flow towards the liver. Other: None. IMPRESSION: Minimal cholelithiasis and large amount of sludge is noted within gallbladder lumen. No gallbladder wall thickening or pericholecystic fluid is noted. Borderline dilatation of common bile duct is noted. Correlation with liver function tests is recommended to rule out obstruction. Electronically Signed   By: Marijo Conception M.D.   On: 08/17/2019 07:58      ERCP 10/18  Lap cholecystectomy 10/19   Subjective: Patient reports feeling well this morning.  Pain well controlled.  Denies fever/chills, N/V/D, chest pain or SOB.     Discharge Exam: Vitals:   08/24/19 2002 08/25/19 0636  BP: (!) 152/77 139/81  Pulse: 70 76  Resp: 16 18  Temp: 98.3 F (36.8 C) 99 F (37.2 C)  SpO2: 100% 100%   Vitals:   08/24/19 0450 08/24/19 1604 08/24/19 2002 08/25/19 0636  BP: 131/71 137/75 (!) 152/77 139/81  Pulse: 74 73 70 76  Resp: 17 18 16 18   Temp: 98.1 F (36.7 C) 98.6 F (37 C) 98.3 F (36.8 C) 99 F (37.2 C)  TempSrc: Oral Oral Oral Oral  SpO2: 99% 100% 100% 100%  Weight:      Height:        General: Pt is alert, awake, not in acute distress Cardiovascular: RRR, S1/S2 +, no rubs, no gallops Respiratory: CTA bilaterally, no wheezing, no rhonchi Abdominal: Soft, NT, ND, bowel sounds +, JP drain with serosanguinous fluid, dressing clean and intact, surgical incisions clean without surrounding erythema or warmth Extremities: no edema, no cyanosis    The results of significant diagnostics from this hospitalization (including imaging, microbiology, ancillary and laboratory)  are listed below for reference.     Microbiology: Recent Results (from the past 240 hour(s))  Culture, blood (Routine X 2) w Reflex to ID Panel     Status: Abnormal   Collection Time: 08/20/19  1:15 PM   Specimen: BLOOD  Result Value Ref Range Status   Specimen Description BLOOD LEFT ANTECUBITAL  Final   Special Requests   Final    BOTTLES DRAWN AEROBIC AND ANAEROBIC Blood Culture adequate volume   Culture  Setup Time   Final    GRAM NEGATIVE RODS IN BOTH AEROBIC AND ANAEROBIC BOTTLES CRITICAL RESULT CALLED TO, READ BACK BY AND VERIFIED WITH: PHARMD E MARTIN [id number] AT 1250 BY CM Performed at [facility], [place] [address], [place], [place] [address]    Culture KLEBSIELLA PNEUMONIAE (A)  Final   Report Status 08/23/2019 FINAL  Final   Organism ID, Bacteria KLEBSIELLA PNEUMONIAE  Final      Susceptibility   Klebsiella pneumoniae - MIC*    AMPICILLIN >=32 RESISTANT Resistant     CEFAZOLIN <=4 SENSITIVE Sensitive     CEFEPIME <=1 SENSITIVE Sensitive     CEFTAZIDIME <=1 SENSITIVE Sensitive     CEFTRIAXONE <=1 SENSITIVE Sensitive     CIPROFLOXACIN <=0.25 SENSITIVE Sensitive     GENTAMICIN <=1 SENSITIVE Sensitive     IMIPENEM <=0.25 SENSITIVE Sensitive     TRIMETH/SULFA <=20 SENSITIVE Sensitive     AMPICILLIN/SULBACTAM 4 SENSITIVE Sensitive     PIP/TAZO 8 SENSITIVE Sensitive     Extended ESBL NEGATIVE Sensitive     * KLEBSIELLA PNEUMONIAE  Blood Culture ID Panel (Reflexed)     Status: Abnormal   Collection Time: 08/20/19  1:15 PM  Result Value Ref Range Status   Enterococcus species NOT DETECTED NOT DETECTED Final   Listeria monocytogenes NOT DETECTED NOT DETECTED Final   Staphylococcus species NOT DETECTED NOT DETECTED Final   Staphylococcus aureus (BCID) NOT DETECTED NOT DETECTED Final   Streptococcus species NOT DETECTED NOT DETECTED Final   Streptococcus agalactiae NOT DETECTED NOT DETECTED Final   Streptococcus pneumoniae NOT DETECTED NOT DETECTED Final   Streptococcus  pyogenes NOT DETECTED NOT DETECTED Final   Acinetobacter baumannii NOT DETECTED NOT DETECTED Final   Enterobacteriaceae species DETECTED (A) NOT DETECTED Final    Comment: Enterobacteriaceae represent a large family of gram-negative bacteria, not a single organism. CRITICAL RESULT CALLED TO, READ BACK BY AND VERIFIED WITH: PHARMD E MARTIN [id number] AT 1250 BY CM    Enterobacter cloacae complex NOT DETECTED NOT DETECTED Final   Escherichia coli  NOT DETECTED NOT DETECTED Final   Klebsiella oxytoca NOT DETECTED NOT DETECTED Final   Klebsiella pneumoniae DETECTED (A) NOT DETECTED Final    Comment: CRITICAL RESULT CALLED TO, READ BACK BY AND VERIFIED WITH: PHARMD E MARTIN [id number] AT 1250 BY CM    Proteus species NOT DETECTED NOT DETECTED Final   Serratia marcescens NOT DETECTED NOT DETECTED Final   Carbapenem resistance NOT DETECTED NOT DETECTED Final   Haemophilus influenzae NOT DETECTED NOT DETECTED Final   Neisseria meningitidis NOT DETECTED NOT DETECTED Final   Pseudomonas aeruginosa NOT DETECTED NOT DETECTED Final   Candida albicans NOT DETECTED NOT DETECTED Final   Candida glabrata NOT DETECTED NOT DETECTED Final   Candida krusei NOT DETECTED NOT DETECTED Final   Candida parapsilosis NOT DETECTED NOT DETECTED Final   Candida tropicalis NOT DETECTED NOT DETECTED Final    Comment: Performed at [facility], [place] [address] [place], [place] [address]  Culture, blood (Routine X 2) w Reflex to ID Panel     Status: Abnormal   Collection Time: 08/20/19  1:19 PM   Specimen: BLOOD LEFT WRIST  Result Value Ref Range Status   Specimen Description BLOOD LEFT WRIST  Final   Special Requests   Final    BOTTLES DRAWN AEROBIC AND ANAEROBIC Blood Culture adequate volume   Culture  Setup Time   Final    GRAM NEGATIVE RODS IN BOTH AEROBIC AND ANAEROBIC BOTTLES CRITICAL RESULT CALLED TO, READ BACK BY AND VERIFIED WITH: PHARMD E MARTIN [id number] AT 1258 BY CM    Culture (A)  Final    KLEBSIELLA  PNEUMONIAE SUSCEPTIBILITIES PERFORMED ON PREVIOUS CULTURE WITHIN THE LAST 5 DAYS. Performed at [facility], [place] [address] [place], [place] [address]    Report Status 08/23/2019 FINAL  Final  Culture, Urine     Status: Abnormal   Collection Time: 08/20/19  1:57 PM   Specimen: Urine, Clean Catch  Result Value Ref Range Status   Specimen Description URINE, CLEAN CATCH  Final   Special Requests NONE  Final   Culture (A)  Final    <10,000 COLONIES/mL INSIGNIFICANT GROWTH Performed at [facility], [place] [address] [place], [place] [address]    Report Status 08/21/2019 FINAL  Final  SARS Coronavirus 2 by RT PCR (hospital order, performed in [facility] hospital lab) Nasopharyngeal Nasopharyngeal Swab     Status: None   Collection Time: 08/20/19  2:26 PM   Specimen: Nasopharyngeal Swab  Result Value Ref Range Status   SARS Coronavirus 2 NEGATIVE NEGATIVE Final    Comment: (NOTE) If result is NEGATIVE SARS-CoV-2 target nucleic acids are NOT DETECTED. The SARS-CoV-2 RNA is generally detectable in upper and lower  respiratory specimens during the acute phase of infection. The lowest  concentration of SARS-CoV-2 viral copies this assay can detect is 250  copies / mL. A negative result does not preclude SARS-CoV-2 infection  and should not be used as the sole basis for treatment or other  patient management decisions.  A negative result may occur with  improper specimen collection / handling, submission of specimen other  than nasopharyngeal swab, presence of viral mutation(s) within the  areas targeted by this assay, and inadequate number of viral copies  (<250 copies / mL). A negative result must be combined with clinical  observations, patient history, and epidemiological information. If result is POSITIVE SARS-CoV-2 target nucleic acids are DETECTED. The SARS-CoV-2 RNA is generally detectable in upper and lower  respiratory specimens dur ing the acute phase of infection.   Positive  results are indicative of active infection with SARS-CoV-2.  Clinical  correlation with patient history and other diagnostic information is  necessary to determine patient infection status.  Positive results do  not rule out bacterial infection or co-infection with other viruses. If result is PRESUMPTIVE POSTIVE SARS-CoV-2 nucleic acids MAY BE PRESENT.   A presumptive positive result was obtained on the submitted specimen  and confirmed on repeat testing.  While 2019 novel coronavirus  (SARS-CoV-2) nucleic acids may be present in the submitted sample  additional confirmatory testing may be necessary for epidemiological  and / or clinical management purposes  to differentiate between  SARS-CoV-2 and other Sarbecovirus currently known to infect humans.  If clinically indicated additional testing with an alternate test  methodology [phone]) is advised. The SARS-CoV-2 RNA is generally  detectable in upper and lower respiratory sp ecimens during the acute  phase of infection. The expected result is Negative. Fact Sheet for Patients:  StrictlyIdeas.no Fact Sheet for Healthcare Providers: BankingDealers.co.za This test is not yet approved or cleared by the [place] FDA and has been authorized for detection and/or diagnosis of SARS-CoV-2 by FDA under an Emergency Use Authorization (EUA).  This EUA will remain in effect (meaning this test can be used) for the duration of the COVID-19 declaration under Section 564(b)(1) of the Act, 21 U.S.C. section 360bbb-3(b)(1), unless the authorization is terminated or revoked sooner. Performed at [facility], [place] [address], [place], [place] [address]   Surgical pcr screen     Status: Abnormal   Collection Time: 08/21/19  4:39 PM   Specimen: Nasal Mucosa; Nasal Swab  Result Value Ref Range Status   MRSA, PCR NEGATIVE NEGATIVE Final   Staphylococcus aureus POSITIVE (A) NEGATIVE Final     Comment: (NOTE) The Xpert SA Assay (FDA approved for NASAL specimens in patients 10 years of age and older), is one component of a comprehensive surveillance program. It is not intended to diagnose infection nor to guide or monitor treatment. Performed at [facility], [place] [address], [place], [place] [address]   Culture, blood (routine x 2)     Status: None (Preliminary result)   Collection Time: 08/23/19 11:15 AM   Specimen: BLOOD LEFT ARM  Result Value Ref Range Status   Specimen Description BLOOD LEFT ARM  Final   Special Requests   Final    BOTTLES DRAWN AEROBIC ONLY Blood Culture adequate volume   Culture   Final    NO GROWTH 2 DAYS Performed at [facility], [place] [address], [place], [place] [address]    Report Status PENDING  Incomplete  Culture, blood (routine x 2)     Status: None (Preliminary result)   Collection Time: 08/23/19 11:16 AM   Specimen: BLOOD  Result Value Ref Range Status   Specimen Description BLOOD LEFT ANTECUBITAL  Final   Special Requests   Final    BOTTLES DRAWN AEROBIC ONLY Blood Culture adequate volume   Culture   Final    NO GROWTH 2 DAYS Performed at [facility], [place] [address], [place], [place] [address]    Report Status PENDING  Incomplete     Labs: BNP (last 3 results) No results for input(s): BNP in the last 8760 hours. Basic Metabolic Panel: Recent Labs  Lab 08/20/19 1047 08/21/19 1208 08/22/19 0424 08/23/19 0721 08/24/19 0300 08/25/19 0232  NA 131* 131* 131* 137  137 138  K 3.7 3.4* 3.7 3.3* 4.2 4.0  CL 97* 101 100 106 108 107  CO2 17* 17* 19* 20* 20* 24  GLUCOSE 74 220* 145* 115* 93 103*  BUN 13 18 22 19 12 8   CREATININE 1.46* 1.46* 1.67* 1.18 1.03 0.89  CALCIUM 8.0* 7.5* 7.3* 7.5* 7.5* 7.7*  MG 1.5*  --   --   --   --   --    Liver Function Tests: Recent Labs  Lab 08/21/19 1208 08/22/19 0424 08/23/19 0721 08/24/19 0300 08/25/19 0232  AST 191* 118* 69* 61* 50*  ALT 124* 102* 80* 64* 55*   ALKPHOS 147* 124 109 107 126  BILITOT 5.8* 2.9* 1.7* 1.5* 1.6*  PROT 5.3* 4.8* 4.7* 4.6* 4.9*  ALBUMIN 2.2* 2.0* 2.1* 2.0* 2.1*   Recent Labs  Lab 08/20/19 1321  LIPASE 18  AMYLASE 19*   No results for input(s): AMMONIA in the last 168 hours. CBC: Recent Labs  Lab 08/18/19 2322  08/20/19 2119 08/21/19 1208 08/22/19 0424 08/22/19 2009 08/23/19 0721 08/24/19 0300 08/25/19 0232  WBC 12.4*   < > 8.1 6.8 7.2  --  12.3* 11.5* 14.7*  NEUTROABS 9.8*  --  6.4  --   --   --   --   --  11.3*  HGB 13.2   < > 11.1* 12.3* 11.1* 9.6* 10.0* 9.6* 9.7*  HCT 37.4*   < > 32.6* 36.0* 31.5* 27.0* 27.9* 27.4* 28.0*  MCV 97.1   < > 98.8 99.2 97.5  --  96.5 99.3 99.6  PLT 127*   < > PLATELET CLUMPS NOTED ON SMEAR, UNABLE TO ESTIMATE PLATELET CLUMPS NOTED ON SMEAR, UNABLE TO ESTIMATE 111*  --  136* 129* 170   < > = values in this interval not displayed.   Cardiac Enzymes: No results for input(s): CKTOTAL, CKMB, CKMBINDEX, TROPONINI in the last 168 hours. BNP: Invalid input(s): POCBNP CBG: Recent Labs  Lab 08/22/19 0809 08/22/19 1325 08/22/19 1741 08/22/19 2151 08/24/19 1243  GLUCAP 137* 149* 142* 120* 95   D-Dimer No results for input(s): DDIMER in the last 72 hours. Hgb A1c No results for input(s): HGBA1C in the last 72 hours. Lipid Profile No results for input(s): CHOL, HDL, LDLCALC, TRIG, CHOLHDL, LDLDIRECT in the last 72 hours. Thyroid function studies No results for input(s): TSH, T4TOTAL, T3FREE, THYROIDAB in the last 72 hours.  Invalid input(s): FREET3 Anemia work up No results for input(s): VITAMINB12, FOLATE, FERRITIN, TIBC, IRON, RETICCTPCT in the last 72 hours. Urinalysis    Component Value Date/Time   COLORURINE AMBER (A) 08/20/2019 1411   APPEARANCEUR HAZY (A) 08/20/2019 1411   LABSPEC 1.026 08/20/2019 1411   PHURINE 5.0 08/20/2019 1411   GLUCOSEU NEGATIVE 08/20/2019 1411   HGBUR SMALL (A) 08/20/2019 1411   BILIRUBINUR MODERATE (A) 08/20/2019 1411   KETONESUR  NEGATIVE 08/20/2019 1411   PROTEINUR 100 (A) 08/20/2019 1411   NITRITE POSITIVE (A) 08/20/2019 1411   LEUKOCYTESUR NEGATIVE 08/20/2019 1411   Sepsis Labs Invalid input(s): PROCALCITONIN,  WBC,  LACTICIDVEN Microbiology Recent Results (from the past 240 hour(s))  Culture, blood (Routine X 2) w Reflex to ID Panel     Status: Abnormal   Collection Time: 08/20/19  1:15 PM   Specimen: BLOOD  Result Value Ref Range Status   Specimen Description BLOOD LEFT ANTECUBITAL  Final   Special Requests   Final    BOTTLES DRAWN AEROBIC AND ANAEROBIC Blood Culture adequate volume   Culture  Setup  Time   Final    GRAM NEGATIVE RODS IN BOTH AEROBIC AND ANAEROBIC BOTTLES CRITICAL RESULT CALLED TO, READ BACK BY AND VERIFIED WITH: PHARMD E MARTIN [id number] AT 1250 BY CM Performed at [facility], [place] [address], [place], [place] [address]    Culture KLEBSIELLA PNEUMONIAE (A)  Final   Report Status 08/23/2019 FINAL  Final   Organism ID, Bacteria KLEBSIELLA PNEUMONIAE  Final      Susceptibility   Klebsiella pneumoniae - MIC*    AMPICILLIN >=32 RESISTANT Resistant     CEFAZOLIN <=4 SENSITIVE Sensitive     CEFEPIME <=1 SENSITIVE Sensitive     CEFTAZIDIME <=1 SENSITIVE Sensitive     CEFTRIAXONE <=1 SENSITIVE Sensitive     CIPROFLOXACIN <=0.25 SENSITIVE Sensitive     GENTAMICIN <=1 SENSITIVE Sensitive     IMIPENEM <=0.25 SENSITIVE Sensitive     TRIMETH/SULFA <=20 SENSITIVE Sensitive     AMPICILLIN/SULBACTAM 4 SENSITIVE Sensitive     PIP/TAZO 8 SENSITIVE Sensitive     Extended ESBL NEGATIVE Sensitive     * KLEBSIELLA PNEUMONIAE  Blood Culture ID Panel (Reflexed)     Status: Abnormal   Collection Time: 08/20/19  1:15 PM  Result Value Ref Range Status   Enterococcus species NOT DETECTED NOT DETECTED Final   Listeria monocytogenes NOT DETECTED NOT DETECTED Final   Staphylococcus species NOT DETECTED NOT DETECTED Final   Staphylococcus aureus (BCID) NOT DETECTED NOT DETECTED Final   Streptococcus  species NOT DETECTED NOT DETECTED Final   Streptococcus agalactiae NOT DETECTED NOT DETECTED Final   Streptococcus pneumoniae NOT DETECTED NOT DETECTED Final   Streptococcus pyogenes NOT DETECTED NOT DETECTED Final   Acinetobacter baumannii NOT DETECTED NOT DETECTED Final   Enterobacteriaceae species DETECTED (A) NOT DETECTED Final    Comment: Enterobacteriaceae represent a large family of gram-negative bacteria, not a single organism. CRITICAL RESULT CALLED TO, READ BACK BY AND VERIFIED WITH: PHARMD E MARTIN [id number] AT 1250 BY CM    Enterobacter cloacae complex NOT DETECTED NOT DETECTED Final   Escherichia coli NOT DETECTED NOT DETECTED Final   Klebsiella oxytoca NOT DETECTED NOT DETECTED Final   Klebsiella pneumoniae DETECTED (A) NOT DETECTED Final    Comment: CRITICAL RESULT CALLED TO, READ BACK BY AND VERIFIED WITH: PHARMD E MARTIN [id number] AT 1250 BY CM    Proteus species NOT DETECTED NOT DETECTED Final   Serratia marcescens NOT DETECTED NOT DETECTED Final   Carbapenem resistance NOT DETECTED NOT DETECTED Final   Haemophilus influenzae NOT DETECTED NOT DETECTED Final   Neisseria meningitidis NOT DETECTED NOT DETECTED Final   Pseudomonas aeruginosa NOT DETECTED NOT DETECTED Final   Candida albicans NOT DETECTED NOT DETECTED Final   Candida glabrata NOT DETECTED NOT DETECTED Final   Candida krusei NOT DETECTED NOT DETECTED Final   Candida parapsilosis NOT DETECTED NOT DETECTED Final   Candida tropicalis NOT DETECTED NOT DETECTED Final    Comment: Performed at [facility], [address] [place], [place] [address]  Culture, blood (Routine X 2) w Reflex to ID Panel     Status: Abnormal   Collection Time: 08/20/19  1:19 PM   Specimen: BLOOD LEFT WRIST  Result Value Ref Range Status   Specimen Description BLOOD LEFT WRIST  Final   Special Requests   Final    BOTTLES DRAWN AEROBIC AND ANAEROBIC Blood Culture adequate volume   Culture  Setup Time   Final    GRAM NEGATIVE  RODS IN BOTH AEROBIC  AND ANAEROBIC BOTTLES CRITICAL RESULT CALLED TO, READ BACK BY AND VERIFIED WITH: PHARMD E MARTIN [id number] AT 1258 BY CM    Culture (A)  Final    KLEBSIELLA PNEUMONIAE SUSCEPTIBILITIES PERFORMED ON PREVIOUS CULTURE WITHIN THE LAST 5 DAYS. Performed at [facility], [place] [address], [place], [place] [address]    Report Status 08/23/2019 FINAL  Final  Culture, Urine     Status: Abnormal   Collection Time: 08/20/19  1:57 PM   Specimen: Urine, Clean Catch  Result Value Ref Range Status   Specimen Description URINE, CLEAN CATCH  Final   Special Requests NONE  Final   Culture (A)  Final    <10,000 COLONIES/mL INSIGNIFICANT GROWTH Performed at [facility], [place] [address] [place], [place] [address]    Report Status 08/21/2019 FINAL  Final  SARS Coronavirus 2 by RT PCR (hospital order, performed in [facility] hospital lab) Nasopharyngeal Nasopharyngeal Swab     Status: None   Collection Time: 08/20/19  2:26 PM   Specimen: Nasopharyngeal Swab  Result Value Ref Range Status   SARS Coronavirus 2 NEGATIVE NEGATIVE Final    Comment: (NOTE) If result is NEGATIVE SARS-CoV-2 target nucleic acids are NOT DETECTED. The SARS-CoV-2 RNA is generally detectable in upper and lower  respiratory specimens during the acute phase of infection. The lowest  concentration of SARS-CoV-2 viral copies this assay can detect is 250  copies / mL. A negative result does not preclude SARS-CoV-2 infection  and should not be used as the sole basis for treatment or other  patient management decisions.  A negative result may occur with  improper specimen collection / handling, submission of specimen other  than nasopharyngeal swab, presence of viral mutation(s) within the  areas targeted by this assay, and inadequate number of viral copies  (<250 copies / mL). A negative result must be combined with clinical  observations, patient history, and epidemiological information. If result is  POSITIVE SARS-CoV-2 target nucleic acids are DETECTED. The SARS-CoV-2 RNA is generally detectable in upper and lower  respiratory specimens dur ing the acute phase of infection.  Positive  results are indicative of active infection with SARS-CoV-2.  Clinical  correlation with patient history and other diagnostic information is  necessary to determine patient infection status.  Positive results do  not rule out bacterial infection or co-infection with other viruses. If result is PRESUMPTIVE POSTIVE SARS-CoV-2 nucleic acids MAY BE PRESENT.   A presumptive positive result was obtained on the submitted specimen  and confirmed on repeat testing.  While 2019 novel coronavirus  (SARS-CoV-2) nucleic acids may be present in the submitted sample  additional confirmatory testing may be necessary for epidemiological  and / or clinical management purposes  to differentiate between  SARS-CoV-2 and other Sarbecovirus currently known to infect humans.  If clinically indicated additional testing with an alternate test  methodology [phone]) is advised. The SARS-CoV-2 RNA is generally  detectable in upper and lower respiratory sp ecimens during the acute  phase of infection. The expected result is Negative. Fact Sheet for Patients:  StrictlyIdeas.no Fact Sheet for Healthcare Providers: BankingDealers.co.za This test is not yet approved or cleared by the [place] FDA and has been authorized for detection and/or diagnosis of SARS-CoV-2 by FDA under an Emergency Use Authorization (EUA).  This EUA will remain in effect (meaning this test can be used) for the duration of the COVID-19 declaration under Section 564(b)(1) of the Act, 21 U.S.C. section 360bbb-3(b)(1), unless  the authorization is terminated or revoked sooner. Performed at [facility], [place] [address] [place], [place] [address]   Surgical pcr screen     Status: Abnormal   Collection  Time: 08/21/19  4:39 PM   Specimen: Nasal Mucosa; Nasal Swab  Result Value Ref Range Status   MRSA, PCR NEGATIVE NEGATIVE Final   Staphylococcus aureus POSITIVE (A) NEGATIVE Final    Comment: (NOTE) The Xpert SA Assay (FDA approved for NASAL specimens in patients 80 years of age and older), is one component of a comprehensive surveillance program. It is not intended to diagnose infection nor to guide or monitor treatment. Performed at [facility], [place] [address] [place], [place] [address]   Culture, blood (routine x 2)     Status: None (Preliminary result)   Collection Time: 08/23/19 11:15 AM   Specimen: BLOOD LEFT ARM  Result Value Ref Range Status   Specimen Description BLOOD LEFT ARM  Final   Special Requests   Final    BOTTLES DRAWN AEROBIC ONLY Blood Culture adequate volume   Culture   Final    NO GROWTH 2 DAYS Performed at [facility], [place] [address] [place], [place] [address]    Report Status PENDING  Incomplete  Culture, blood (routine x 2)     Status: None (Preliminary result)   Collection Time: 08/23/19 11:16 AM   Specimen: BLOOD  Result Value Ref Range Status   Specimen Description BLOOD LEFT ANTECUBITAL  Final   Special Requests   Final    BOTTLES DRAWN AEROBIC ONLY Blood Culture adequate volume   Culture   Final    NO GROWTH 2 DAYS Performed at [facility], [place] [address], [place], [place] [address]    Report Status PENDING  Incomplete     Time coordinating discharge: Over 30 minutes  SIGNED:   Ezekiel Slocumb, DO [facility] 08/25/2019, 9:02 PM Pager [phone]  If 7PM-7AM, please contact night-coverage [email or web address] Password [email or web address]

## 2019-08-25 NOTE — Consult Note (Signed)
   [facility] CM Inpatient Consult   08/25/2019  Dshaun Reppucci 07-19-1951 [id number]   Attempts to reach patient today again to verify contact information for post hospital follow up as referred. Was able to get the staff to assist the patient with the phone at bedside. HIPAA verified.  Explained [facility] Care Management services and patient was interested in assistance with navigating information for a new primary care provider and verbally consented to post hospital transition of care follow up as well.  Patient states, "Can we please have follow up next week, I am drained right now with all that's gone on."  Plan:  Will assign patient to [place] for post hospital needs and patient desired next week for contact per patient request.  Natividad Brood, RN BSN [facility] Liaison  [phone] business mobile phone Toll free office [phone]  Fax number: [phone] [email or web address]

## 2019-08-25 NOTE — Evaluation (Signed)
Occupational Therapy Evaluation Patient Details Name: Joseph Orr MRN: [id number] DOB: 1950/11/14 Today's Date: 08/25/2019    History of Present Illness Pt is a 68 y/o male admitted secondary to nausea and chest pain. Found to have cholecystitis. Pt is s/p ERCP and s/p lap chole. PMH includes CVA, gout, and GERD.    Clinical Impression   Pt at Mod I baseline level of function with ADLs/selfcare, pt min guard A - sup with mobility using cane and no AD as pt reaching out for walls although using cane and declines use of a RW. Pt to d/c home with family this afternoon. All education completed and no further acute OT is indicated at this time    Follow Up Recommendations   no follow up OT   Equipment Recommendations  Tub/shower bench;Other (comment)(cane, reacher)    Recommendations for Other Services       Precautions / Restrictions Precautions Precautions: None Restrictions Weight Bearing Restrictions: No      Mobility Bed Mobility               General bed mobility comments: pt sitting EOB upon arrival  Transfers Overall transfer level: Needs assistance Equipment used: None;Straight cane Transfers: Sit to/from Stand Sit to Stand: Min guard;Supervision         General transfer comment: pt reaching out for wall to steady self, states "I do that all the time at home and I don't need a walker"    Balance Overall balance assessment: Needs assistance Sitting-balance support: No upper extremity supported;Feet supported Sitting balance-Leahy Scale: Good     Standing balance support: Single extremity supported;During functional activity Standing balance-Leahy Scale: Fair                             ADL either performed or assessed with clinical judgement   ADL Overall ADL's : At baseline;Modified independent                                       General ADL Comments: sup with LB sit - stand transitions/clothing mgt      Vision Patient Visual Report: No change from baseline       Perception     Praxis      Pertinent Vitals/Pain Pain Assessment: 0-10 Pain Score: 2  Pain Descriptors / Indicators: Sore Pain Intervention(s): Monitored during session;Repositioned     Hand Dominance Right   Extremity/Trunk Assessment Upper Extremity Assessment Upper Extremity Assessment: Overall WFL for tasks assessed   Lower Extremity Assessment Lower Extremity Assessment: Defer to PT evaluation   Cervical / Trunk Assessment Cervical / Trunk Assessment: Normal   Communication Communication Communication: No difficulties   Cognition Arousal/Alertness: Awake/alert Behavior During Therapy: WFL for tasks assessed/performed Overall Cognitive Status: Within Functional Limits for tasks assessed                                     General Comments       Exercises     Shoulder Instructions      Home Living Family/patient expects to be discharged to:: Private residence Living Arrangements: Spouse/significant other Available Help at Discharge: Family Type of Home: House Home Access: Stairs to enter Technical brewer of Steps: 2 Entrance Stairs-Rails: None Home Layout: One level  Bathroom Shower/Tub: Doctor, general practice: None          Prior Functioning/Environment Level of Independence: Independent                 OT Problem List: Impaired balance (sitting and/or standing);Decreased coordination;Decreased knowledge of use of DME or AE      OT Treatment/Interventions:      OT Goals(Current goals can be found in the care plan section) Acute Rehab OT Goals Patient Stated Goal: to go home OT Goal Formulation: With patient  OT Frequency:     Barriers to D/C:            Co-evaluation              AM-PAC OT "6 Clicks" Daily Activity     Outcome Measure Help from another person eating meals?: None Help  from another person taking care of personal grooming?: None Help from another person toileting, which includes using toliet, bedpan, or urinal?: None Help from another person bathing (including washing, rinsing, drying)?: None Help from another person to put on and taking off regular upper body clothing?: None Help from another person to put on and taking off regular lower body clothing?: A Little 6 Click Score: 23   End of Session Equipment Utilized During Treatment: Gait belt  Activity Tolerance: Patient tolerated treatment well Patient left: in bed;Other (comment)(sitting EOB)  OT Visit Diagnosis: Unsteadiness on feet (R26.81);Muscle weakness (generalized) (M62.81)                Time: 0865-7846 OT Time Calculation (min): 21 min Charges:  OT General Charges $OT Visit: 1 Visit OT Evaluation $OT Eval Low Complexity: 1 Low    Galen Manila 08/25/2019, 1:11 PM

## 2019-08-28 LAB — CULTURE, BLOOD (ROUTINE X 2)
Culture: NO GROWTH
Culture: NO GROWTH
Special Requests: ADEQUATE
Special Requests: ADEQUATE

## 2019-08-30 ENCOUNTER — Other Ambulatory Visit: Payer: Self-pay

## 2019-08-30 NOTE — Consult Note (Signed)
Received a call from the patient for scheduling his post hospital [facility] CM follow up call.  HIPAA verified.  Explained that this Probation officer doesn't do the scheduling however will reach out to the care coordinator and give information.  A message was sent to [facility], assigned on the team currently for follow up.  Natividad Brood, RN BSN [facility] Liaison  [phone] business mobile phone Toll free office [phone]  Fax number: [phone] [email or web address]

## 2019-09-01 ENCOUNTER — Other Ambulatory Visit: Payer: Self-pay | Admitting: *Deleted

## 2019-09-01 ENCOUNTER — Encounter: Payer: Self-pay | Admitting: *Deleted

## 2019-09-01 NOTE — Patient Outreach (Signed)
Initial transition of care call.  Pt is doing very well. Home with wife and is independent. Minor changes in mobility that are improving. Uses quad cane. Getting a shower chair. Has grab bars.  Pt had surgical follow up yesterday and his drain was removed. Surgical site appears well without signs of infection and has f/u in 2 more weeks.  Pt requests information on choosing a new primary care MD.  Pt requests information on Advanced Directives for himself and his wife.  Patient was recently discharged from hospital and all medications have been reviewed. Outpatient Encounter Medications as of 09/01/2019  Medication Sig Note  . acetaminophen (TYLENOL) 325 MG tablet Take 2 tablets (650 mg total) by mouth every 6 (six) hours.   Marland Kitchen albuterol (PROVENTIL HFA;VENTOLIN HFA) 108 (90 Base) MCG/ACT inhaler Inhale 2 puffs into the lungs every 4 (four) hours as needed for wheezing or shortness of breath.   . allopurinol (ZYLOPRIM) 100 MG tablet TAKE 1 TABLET BY MOUTH DAILY (Patient taking differently: Take 100 mg by mouth every morning. )   . cetirizine (ZYRTEC) 10 MG tablet Take 10 mg by mouth every morning.    . clopidogrel (PLAVIX) 75 MG tablet TAKE 1 TABLET BY MOUTH DAILY (Patient taking differently: Take 75 mg by mouth every morning. )   . cyanocobalamin 2000 MCG tablet Take 2,000 mcg by mouth every morning.   . pantoprazole (PROTONIX) 20 MG tablet Take 1 tablet (20 mg total) by mouth daily. (Patient taking differently: Take 20 mg by mouth every morning. )   . ondansetron (ZOFRAN) 4 MG tablet Take 1 tablet (4 mg total) by mouth every 6 (six) hours. (Patient not taking: Reported on 09/01/2019)   . [DISCONTINUED] prednisoLONE acetate (PRED FORTE) 1 % ophthalmic suspension Place 1 drop into the right eye daily. 08/20/2019: Pt took his last dose Thursday evening before coming to [facility]   No facility-administered encounter medications on file as of 09/01/2019.    Fall Risk  07/22/2017  Falls in the past year?  No   Depression screen [facility] 2/9 09/01/2019 07/22/2017  Decreased Interest 0 0  Down, Depressed, Hopeless 0 0  PHQ - 2 Score 0 0   We also discussed alcohol intake and interactions with clopidogrel briefly.   Will send pt his welcome letter and I will call him next Thursday for follow up.  Eulah Pont. Myrtie Neither, MSN, [facility] Gerontological Nurse Practitioner [facility] Care Management [phone]

## 2019-09-02 ENCOUNTER — Other Ambulatory Visit: Payer: Self-pay

## 2019-09-05 NOTE — Telephone Encounter (Signed)
As I said before I have not prescribed Ambien for him in the past (I do not see Rx in med list) , I do not recommend Ambien at his age. Given the fact this seems to be a problem that we have not addressed in the past, I think an appointment is appropriate. Please arrange appointment to discuss insomnia. Thanks, BJ

## 2019-09-06 ENCOUNTER — Telehealth: Payer: Self-pay | Admitting: Family Medicine

## 2019-09-06 NOTE — Telephone Encounter (Signed)
Left message for patient to call back. CRM created 

## 2019-09-06 NOTE — Telephone Encounter (Signed)
Patient is requesting a TOC from [place], to Mary Cirigliano at [place]. °Patient moved closer to the [place] location.  °Call back [phone] °

## 2019-09-06 NOTE — Telephone Encounter (Signed)
It is ok with me. Thanks, BJ 

## 2019-09-07 NOTE — Telephone Encounter (Signed)
Ok with me 

## 2019-09-08 ENCOUNTER — Other Ambulatory Visit: Payer: Self-pay | Admitting: *Deleted

## 2019-09-08 ENCOUNTER — Encounter: Payer: Self-pay | Admitting: *Deleted

## 2019-09-08 NOTE — Patient Outreach (Signed)
Telephone outreach successful. Initial assessment completed.  Pt is doing well. Has acted on all of his goals!  Reviewed information on Advanced Directives. Chose new primary care provider. Has increased his activity level.  Praised pt for engaging in his self management for a better quality of life.  I will mail the advanced directives documents.  Will call again in one week.  Eulah Pont. Myrtie Neither, MSN, [facility] Gerontological Nurse Practitioner [facility] Care Management [phone]

## 2019-09-08 NOTE — Telephone Encounter (Signed)
Patient have been made aware that Dr Martinique is ok with his transfer

## 2019-09-10 ENCOUNTER — Other Ambulatory Visit: Payer: Self-pay | Admitting: Family Medicine

## 2019-09-10 DIAGNOSIS — I639 Cerebral infarction, unspecified: Secondary | ICD-10-CM

## 2019-09-13 ENCOUNTER — Other Ambulatory Visit: Payer: Self-pay | Admitting: Family Medicine

## 2019-09-13 DIAGNOSIS — I639 Cerebral infarction, unspecified: Secondary | ICD-10-CM

## 2019-09-14 ENCOUNTER — Other Ambulatory Visit: Payer: Self-pay

## 2019-09-15 ENCOUNTER — Ambulatory Visit (INDEPENDENT_AMBULATORY_CARE_PROVIDER_SITE_OTHER): Payer: Medicare HMO | Admitting: Family Medicine

## 2019-09-15 ENCOUNTER — Encounter: Payer: Self-pay | Admitting: Family Medicine

## 2019-09-15 ENCOUNTER — Other Ambulatory Visit: Payer: Self-pay | Admitting: *Deleted

## 2019-09-15 VITALS — BP 120/70 | HR 95 | Temp 98.1°F | Ht 69.0 in | Wt 184.2 lb

## 2019-09-15 DIAGNOSIS — K2100 Gastro-esophageal reflux disease with esophagitis, without bleeding: Secondary | ICD-10-CM

## 2019-09-15 DIAGNOSIS — I639 Cerebral infarction, unspecified: Secondary | ICD-10-CM | POA: Diagnosis not present

## 2019-09-15 DIAGNOSIS — Z23 Encounter for immunization: Secondary | ICD-10-CM | POA: Diagnosis not present

## 2019-09-15 DIAGNOSIS — Z0000 Encounter for general adult medical examination without abnormal findings: Secondary | ICD-10-CM | POA: Diagnosis not present

## 2019-09-15 DIAGNOSIS — M109 Gout, unspecified: Secondary | ICD-10-CM

## 2019-09-15 LAB — LIPID PANEL
Cholesterol: 129 mg/dL (ref 0–200)
HDL: 46.6 mg/dL (ref >39.00)
LDL Cholesterol: 66 mg/dL (ref 0–99)
NonHDL: 82.56
Total CHOL/HDL Ratio: 3
Triglycerides: 83 mg/dL (ref 0.0–149.0)
VLDL: 16.6 mg/dL (ref 0.0–40.0)

## 2019-09-15 LAB — VITAMIN B12: Vitamin B-12: 923 pg/mL — ABNORMAL HIGH (ref 211–911)

## 2019-09-15 LAB — GLUCOSE, RANDOM: Glucose, Bld: 105 mg/dL — ABNORMAL HIGH (ref 70–99)

## 2019-09-15 MED ORDER — CLOPIDOGREL BISULFATE 75 MG PO TABS: 75.0000 mg | ORAL_TABLET | Freq: Every day | ORAL | 3 refills | Status: AC

## 2019-09-15 MED ORDER — PANTOPRAZOLE SODIUM 20 MG PO TBEC: 20.0000 mg | DELAYED_RELEASE_TABLET | Freq: Every day | ORAL | 3 refills | Status: DC

## 2019-09-15 MED ORDER — ALLOPURINOL 100 MG PO TABS: 100.0000 mg | ORAL_TABLET | Freq: Every day | ORAL | 3 refills | Status: AC

## 2019-09-15 NOTE — Progress Notes (Signed)
Joseph Orr is a 68 y.o. male  Chief Complaint  Patient presents with  . Establish Care    transfer of care.. cpe X fasting    HPI: Joseph Orr is a 68 y.o. male here to transfer his care to out office. Previous PCP Dr. Betty [place]. He is originally from [place], moved to area with his wife to be close to his daughter who works at [employer] and 3 grandchildren. He has 3 grown children and 9 grandchildren. Pt states he is due for CPE. He is fasting for labs. He has not had the shingles vaccine and would like to do this today. He had his flu vaccine.   Last Colonoscopy: 03/2010 - due in 03/2020 (would like to see [facility])  Diet/Exercise: walks regularly and diet is healthy  Med refills needed today: see orders  Past Medical History:  Diagnosis Date  . Arthritis   . CVA (cerebral vascular accident) (HCC) 03/03/2019  . GERD (gastroesophageal reflux disease)   . Gout   . TIA (transient ischemic attack) 02/2019    Past Surgical History:  Procedure Laterality Date  . CHOLECYSTECTOMY N/A 08/22/2019   Procedure: LAPAROSCOPIC CHOLECYSTECTOMY;  Surgeon: Berna Bue, MD;  Location: [facility] OR;  Service: General;  Laterality: N/A;  . COLONOSCOPY     +8y  . ERCP N/A 08/21/2019   Procedure: ENDOSCOPIC RETROGRADE CHOLANGIOPANCREATOGRAPHY (ERCP);  Surgeon: Jeani Hawking, MD;  Location: [facility] ENDOSCOPY;  Service: Endoscopy;  Laterality: N/A;  . KIDNEY DONATION    . KNEE ARTHROSCOPY    . REMOVAL OF STONES  08/21/2019   Procedure: REMOVAL OF STONES;  Surgeon: Jeani Hawking, MD;  Location: [facility] ENDOSCOPY;  Service: Endoscopy;;  . Dennison Mascot  08/21/2019   Procedure: Dennison Mascot;  Surgeon: Jeani Hawking, MD;  Location[facility] ENDOSCOPY;  Service: Endoscopy;;    Social History   Socioeconomic History  . Marital status: Married    Spouse name: Kennon Rounds  . Number of children: 3  . Years of education: 2 years   . Highest education level: Some college, no degree  Occupational History   . Not on file  Social Needs  . Financial resource strain: Not hard at all  . Food insecurity    Worry: Never true    Inability: Never true  . Transportation needs    Medical: No    Non-medical: No  Tobacco Use  . Smoking status: Former Smoker    Types: Cigarettes    Quit date: 11/03/1998    Years since quitting: 20.8  . Smokeless tobacco: Never Used  Substance and Sexual Activity  . Alcohol use: Yes    Alcohol/week: 3.0 - 4.0 standard drinks    Types: 3 - 4 Cans of beer per week    Comment: daily  . Drug use: Never  . Sexual activity: Yes  Lifestyle  . Physical activity    Days per week: 3 days    Minutes per session: 20 min  . Stress: Not at all  Relationships  . Social connections    Talks on phone: More than three times a week    Gets together: More than three times a week    Attends religious service: Never    Active member of club or organization: No    Attends meetings of clubs or organizations: Never    Relationship status: Married  . Intimate partner violence    Fear of current or ex partner: No    Emotionally abused: No  Physically abused: No    Forced sexual activity: No  Other Topics Concern  . Not on file  Social History Narrative   Moved here to be with her daughter who works at [employer]. 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 Originally from [place].     Family History  Problem Relation Age of Onset  . Arthritis Mother   . Arthritis Father   . Pneumonia Father        Died age 68  . Stroke Brother      Immunization History  Administered Date(s) Administered  . Influenza, High Dose Seasonal PF 07/22/2017, 07/06/2019  . Influenza,inj,quad, With Preservative 07/04/2017  . Influenza-Unspecified 08/31/2018  . Pneumococcal Conjugate-13 07/22/2017  . Pneumococcal Polysaccharide-23 11/03/2012  . Pneumococcal-Unspecified 07/04/2017  . Tdap 11/03/2012    Outpatient Encounter Medications as of 09/15/2019  Medication Sig  . acetaminophen  (TYLENOL) 325 MG tablet Take 2 tablets (650 mg total) by mouth every 6 (six) hours.  Marland Kitchen. albuterol (PROVENTIL HFA;VENTOLIN HFA) 108 (90 Base) MCG/ACT inhaler Inhale 2 puffs into the lungs every 4 (four) hours as needed for wheezing or shortness of breath.  . allopurinol (ZYLOPRIM) 100 MG tablet Take 1 tablet (100 mg total) by mouth daily.  . cetirizine (ZYRTEC) 10 MG tablet Take 10 mg by mouth every morning.   . clopidogrel (PLAVIX) 75 MG tablet Take 1 tablet (75 mg total) by mouth daily.  . cyanocobalamin 2000 MCG tablet Take 2,000 mcg by mouth every morning.  . pantoprazole (PROTONIX) 20 MG tablet Take 1 tablet (20 mg total) by mouth daily.  . [DISCONTINUED] allopurinol (ZYLOPRIM) 100 MG tablet TAKE 1 TABLET BY MOUTH DAILY (Patient taking differently: Take 100 mg by mouth every morning. )  . [DISCONTINUED] clopidogrel (PLAVIX) 75 MG tablet TAKE 1 TABLET BY MOUTH DAILY (Patient taking differently: Take 75 mg by mouth every morning. )  . [DISCONTINUED] pantoprazole (PROTONIX) 20 MG tablet Take 1 tablet (20 mg total) by mouth daily. (Patient taking differently: Take 20 mg by mouth every morning. )  . [DISCONTINUED] ondansetron (ZOFRAN) 4 MG tablet Take 1 tablet (4 mg total) by mouth every 6 (six) hours. (Patient not taking: Reported on 09/01/2019)   No facility-administered encounter medications on file as of 09/15/2019.      ROS: Gen: no fever, chills  Skin: no rash, itching ENT: no ear pain, ear drainage, nasal congestion, rhinorrhea, sinus pressure, sore throat Eyes: no blurry vision, double vision Resp: no cough, wheeze,SOB CV: no CP, palpitations, LE edema,  GI: no heartburn, n/v/d/c, abd pain GU: no dysuria, urgency, frequency, hematuria MSK: no joint pain, myalgias, back pain Neuro: no dizziness, headache, weakness Psych: no depression, anxiety, insomnia   Allergies  Allergen Reactions  . Oxycodone-Acetaminophen Nausea And Vomiting  . Atorvastatin Nausea And Vomiting  .  Rosuvastatin Nausea And Vomiting    BP 120/70   Pulse 95   Temp 98.1 F (36.7 C) (Temporal)   Ht 5\' 9"  (1.753 m)   Wt 184 lb 3.2 oz (83.6 kg)   SpO2 98%   BMI 27.20 kg/m   Physical Exam  Constitutional: He is oriented to person, place, and time. He appears well-developed and well-nourished. No distress.  HENT:  Head: Normocephalic and atraumatic.  Right Ear: Tympanic membrane and ear canal normal.  Left Ear: Tympanic membrane and ear canal normal.  Nose: Nose normal.  Mouth/Throat: Oropharynx is clear and moist and mucous membranes are normal.  Neck: Neck supple. No thyromegaly present.  Cardiovascular: Normal rate, regular  rhythm, normal heart sounds and intact distal pulses.  Pulmonary/Chest: Effort normal and breath sounds normal. He has no wheezes. He has no rhonchi.  Abdominal: Soft. Bowel sounds are normal. He exhibits no distension and no mass. There is no abdominal tenderness. There is no rebound and no guarding.  Musculoskeletal: Normal range of motion.        General: No edema.  Lymphadenopathy:    He has no cervical adenopathy.  Neurological: He is alert and oriented to person, place, and time. He exhibits normal muscle tone. Coordination normal.  Skin: Skin is warm and dry.  Psychiatric: He has a normal mood and affect.     A/P:  1. Annual physical exam - discussed importance of regular CV exercise, healthy diet, adequate sleep - UTD on colonoscopy, due next year - CMP, CBC done in 08/2019 - Lipid panel - Vitamin B12 - Glucose - next CPE in 1 year  2. Cerebrovascular accident (CVA), unspecified mechanism ([place]) Refill: - clopidogrel (PLAVIX) 75 MG tablet; Take 1 tablet (75 mg total) by mouth daily.  Dispense: 90 tablet; Refill: 3  3. Gouty arthritis - stable, pt has taken allopurinol daily x years Refill: - allopurinol (ZYLOPRIM) 100 MG tablet; Take 1 tablet (100 mg total) by mouth daily.  Dispense: 90 tablet; Refill: 3  4. Gastroesophageal reflux  disease with esophagitis without hemorrhage - stable, well-controlled, pt currently taking pantoprazole PRN Refill: - pantoprazole (PROTONIX) 20 MG tablet; Take 1 tablet (20 mg total) by mouth daily.  Dispense: 90 tablet; Refill: 3  5. Need for shingles vaccine - Varicella-zoster vaccine IM (Shingrix) - RTO in 2-6 mo or shingrix #2

## 2019-09-15 NOTE — Patient Instructions (Signed)

## 2019-09-16 NOTE — Patient Outreach (Signed)
Telephone outreach made day after scheduled call. Unable to reach Mr. Crissman and left message requesting a return call.   I will call him Tuesday if I do not hear from him before then.  Joseph Orr. Myrtie Neither, MSN, [facility] Gerontological Nurse Practitioner [facility] Care Management [phone]

## 2019-09-19 ENCOUNTER — Other Ambulatory Visit: Payer: Self-pay

## 2019-09-19 ENCOUNTER — Ambulatory Visit: Payer: Medicare HMO | Attending: Internal Medicine

## 2019-09-19 DIAGNOSIS — R2689 Other abnormalities of gait and mobility: Secondary | ICD-10-CM | POA: Diagnosis not present

## 2019-09-19 DIAGNOSIS — Z961 Presence of intraocular lens: Secondary | ICD-10-CM | POA: Diagnosis not present

## 2019-09-19 DIAGNOSIS — M6281 Muscle weakness (generalized): Secondary | ICD-10-CM

## 2019-09-19 NOTE — Therapy (Signed)
[facility] [address] [place], [place], [address] Phone: [phone]   Fax:  [phone]  Physical Therapy Evaluation  Patient Details  Name: Joseph Orr MRN: [id number] Date of Birth: 21-Jun-1951 Referring Provider (PT): referred by Joseph Orr but PCP Joseph Orr   Encounter Date: 09/19/2019  PT End of Session - 09/19/19 1019    Visit Number  1    Number of Visits  13    Date for PT Re-Evaluation  11/25/19    Authorization Type  aetna medicare 10th visit progress note    PT Start Time  1018    PT Stop Time  1059    PT Time Calculation (min)  41 min    Activity Tolerance  Patient tolerated treatment well    Behavior During Therapy  [facility] [place] for tasks assessed/performed       Past Medical History:  Diagnosis Date  . Arthritis   . CVA (cerebral vascular accident) (HCC) 03/03/2019  . GERD (gastroesophageal reflux disease)   . Gout   . TIA (transient ischemic attack) 02/2019    Past Surgical History:  Procedure Laterality Date  . CHOLECYSTECTOMY N/A 08/22/2019   Procedure: LAPAROSCOPIC CHOLECYSTECTOMY;  Surgeon: Joseph Bue, MD;  Location: [facility] OR;  Service: General;  Laterality: N/A;  . COLONOSCOPY     +8y  . ERCP N/A 08/21/2019   Procedure: ENDOSCOPIC RETROGRADE CHOLANGIOPANCREATOGRAPHY (ERCP);  Surgeon: Joseph Hawking, MD;  Location[facility] ENDOSCOPY;  Service: Endoscopy;  Laterality: N/A;  . KIDNEY DONATION    . KNEE ARTHROSCOPY    . REMOVAL OF STONES  08/21/2019   Procedure: REMOVAL OF STONES;  Surgeon: Joseph Hawking, MD;  Location: [facility] ENDOSCOPY;  Service: Endoscopy;;  . Dennison Mascot  08/21/2019   Procedure: Dennison Mascot;  Surgeon: Joseph Hawking, MD;  Location: [facility] ENDOSCOPY;  Service: Endoscopy;;    There were no vitals filed for this visit.   Subjective Assessment - 09/19/19 1019    Subjective  Pt is 68 y/o with h/o CVA, left sided lacunar infact 03/03/2019. Pt reports that he was  just feeling really tired and had some tingling in right arm with some walking dificulty when occurred. Reports symptoms only really lasted a day but did have some issues with fine motor skills like writing. Called doctor and they did scan and found he had CVA and was placed on blood thinner and that was it. Recent admission for cholecystitis and underwent lap chole on 08/22/2019 with JP drain. Pt reports that he coded while in surgery. He no longer has drain and is healed. He has worked himself back up to walking about a mile a day but broken up. Does not increased sway with gait. Reports notices biggest issue when turning and balance not as good. Reports steps are also a challenge with going down. Reports he fatigues quickly.    Pertinent History  hyperlipidemia, GERD, gouty arthritis    Patient Stated Goals  Pt would like to walk better and father.    Currently in Pain?  Yes    Pain Score  2     Pain Location  Abdomen    Pain Orientation  Right;Lateral    Pain Descriptors / Indicators  Sore    Pain Type  Surgical pain    Pain Onset  1 to 4 weeks ago    Pain Frequency  Intermittent    Aggravating Factors   moving certain positions         [facility] PT Assessment -  09/19/19 1027      Assessment   Medical Diagnosis  left lacunar infarct    Referring Provider (PT)  referred by Joseph Orr but PCP Joseph Orr    Onset Date/Surgical Date  03/03/19    Hand Dominance  Right    Prior Therapy  no prior therapy      Balance Screen   Has the patient fallen in the past 6 months  No    Has the patient had a decrease in activity level because of a fear of falling?   No    Is the patient reluctant to leave their home because of a fear of falling?   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 [employer]  golfing, walking      Cognition   Overall Cognitive Status  Within Functional Limits for tasks assessed      Observation/Other Assessments   Observations  healed laproscopic incisions      Sensation   Light Touch  Appears Intact      Coordination   Gross Motor Movements are Fluid and Coordinated  Yes   RAMs intact   Fine Motor Movements are Fluid and Coordinated  Yes   finger opposition intact     ROM / Strength   AROM / PROM / Strength  Strength      Strength   Strength Assessment Site  Shoulder;Elbow;Hip;Knee;Ankle    Right/Left Shoulder  Right;Left    Right Shoulder Flexion  5/5    Left Shoulder Flexion  5/5    Right/Left Elbow  Right;Left    Right Elbow Flexion  5/5    Right Elbow Extension  5/5    Left Elbow Flexion  5/5    Left Elbow Extension  5/5    Right/Left Hip  Right;Left    Right Hip Flexion  4+/5    Right Hip ABduction  4+/5    Left Hip Flexion  5/5    Left Hip ABduction  5/5    Right/Left Knee  Right;Left    Right Knee Flexion  5/5    Right Knee Extension  5/5    Left Knee Flexion  5/5    Left Knee Extension  5/5    Right/Left Ankle  Right;Left    Right Ankle Dorsiflexion  5/5    Left Ankle Dorsiflexion  5/5      Bed Mobility   Bed Mobility  Rolling Right;Rolling Left;Supine to Sit;Sit to Supine    Rolling Right  Independent    Rolling Left  Independent    Supine to Sit  Independent    Sit to Supine  Independent      Transfers   Transfers  Sit to Stand;Stand to Sit  Sit to Stand  7: Independent    Stand to Sit  7: Independent       Ambulation/Gait   Ambulation/Gait  Yes    Ambulation/Gait Assistance  7: Independent    Ambulation Distance (Feet)  100 Feet    Assistive device  None    Gait Pattern  Step-through pattern;Decreased step length - right;Decreased step length - left   increased lateral sway    Gait velocity  0.22m/s    Stairs  Yes    Stair Management Technique  One rail Left;Alternating pattern   slow descent   Number of Stairs  4    Gait Comments  4/10 RPE after gait and balance activities      Standardized Balance Assessment   Standardized Balance Assessment  Berg Balance Test      Berg Balance Test   Sit to Stand  Able to stand without using hands and stabilize independently    Standing Unsupported  Able to stand safely 2 minutes    Sitting with Back Unsupported but Feet Supported on Floor or Stool  Able to sit safely and securely 2 minutes    Stand to Sit  Sits safely with minimal use of hands    Transfers  Able to transfer safely, minor use of hands    Standing Unsupported with Eyes Closed  Able to stand 10 seconds safely    Standing Unsupported with Feet Together  Able to place feet together independently and stand 1 minute safely    From Standing, Reach Forward with Outstretched Arm  Can reach confidently >25 cm (10")    From Standing Position, Pick up Object from Floor  Able to pick up shoe safely and easily    From Standing Position, Turn to Look Behind Over each Shoulder  Looks behind from both sides and weight shifts well    Turn 360 Degrees  Able to turn 360 degrees safely but slowly    Standing Unsupported, Alternately Place Feet on Step/Stool  Able to stand independently and safely and complete 8 steps in 20 seconds    Standing Unsupported, One Foot in Front  Able to plae foot ahead of the other independently and hold 30 seconds    Standing on One Leg  Able to lift leg independently and hold 5-10 seconds    Total Score  52      Functional Gait  Assessment   Gait assessed   Yes     Gait Level Surface  Walks 20 ft in less than 7 sec but greater than 5.5 sec, uses assistive device, slower speed, mild gait deviations, or deviates 6-10 in outside of the 12 in walkway width.    Change in Gait Speed  Able to smoothly change walking speed without loss of balance or gait deviation. Deviate no more than 6 in outside of the 12 in walkway width.    Gait with Horizontal Head Turns  Performs head turns smoothly with no change in gait. Deviates no more than 6 in outside 12 in walkway width    Gait with Vertical Head Turns  Performs head turns with no change in gait. Deviates no more than 6 in outside 12 in walkway width.    Gait and Pivot Turn  Pivot turns safely within 3 sec and stops quickly with no loss of balance.    Step Over Obstacle  Is able to step over 2 stacked shoe boxes taped together (9 in total height) without changing gait speed. No evidence  of imbalance.    Gait with Narrow Base of Support  Ambulates 7-9 steps.    Gait with Eyes Closed  Walks 20 ft, uses assistive device, slower speed, mild gait deviations, deviates 6-10 in outside 12 in walkway width. Ambulates 20 ft in less than 9 sec but greater than 7 sec.    Ambulating Backwards  Walks 20 ft, uses assistive device, slower speed, mild gait deviations, deviates 6-10 in outside 12 in walkway width.    Steps  Alternating feet, must use rail.    Total Score  25                Objective measurements completed on examination: See above findings.              PT Education - 09/19/19 1221    Education Details  Pt was instructed in PT plan of care.    Person(s) Educated  Patient    Methods  Explanation    Comprehension  Verbalized understanding       PT Short Term Goals - 09/19/19 1822      PT SHORT TERM GOAL #1   Title  Pt will be independent with strengthening and balance HEP to continue gains on own.    Time  4    Period  Weeks    Status  New    Target Date  10/19/19      PT SHORT TERM GOAL  #2   Title  Pt will increase gait speed to >1.27m/s for improved community ambulation.    Baseline  0.32m/s on 11/16/20204    Time  4    Period  Weeks    Status  New        PT Long Term Goals - 09/19/19 1824      PT LONG TERM GOAL #1   Title  Pt will be able to maintain SLS >10 sec bilateral for improved balance.    Time  8    Period  Weeks    Status  New    Target Date  11/18/19      PT LONG TERM GOAL #2   Title  Pt will report <4/10 RPE with functional activiites for improved activity tolerance.    Time  8    Period  Weeks    Status  New    Target Date  11/18/19      PT LONG TERM GOAL #3   Title  Pt will ambulate up/down steps with reciprocal pattern without rail independently for improved functional strength and balance.    Time  8    Period  Weeks    Status  New    Target Date  11/18/19      PT LONG TERM GOAL #4   Title  Pt will ambulate x 10 min for improved activity tolerance and community access.    Time  8    Period  Weeks    Status  New    Target Date  11/18/19             Plan - 09/19/19 1221    Clinical Impression Statement  Pt presents with history of left lacunar infarct 03/03/2019 and recent hospital admission for cholecystitis with lap chole on 08/22/2019. Pt has decreased activity tolerance with RPE of 4/10 with short gait distances and balance testing in clinic. Lower fall risk based on Berg of 52/56 and FGA on 25/30 but did struggle with higher level balance tasks like SLS and tandem  stance. Pt's gait speed is slower than normal community ambulator at 0.6018m/s. Pt will benefit from skilled PT to address strength, balance and gait deficits to maximize independence in community.    Personal Factors and Comorbidities  Comorbidity 3+    Comorbidities  hyperlipidemia, GERD, gouty arthritis    Examination-Activity Limitations  Locomotion Level;Stairs    Examination-Participation Restrictions  Community Activity    Stability/Clinical Decision Making   Evolving/Moderate complexity    Clinical Decision Making  Moderate    Rehab Potential  Good    PT Frequency  2x / week   followed by 1x/wk for 4 weeks if needed   PT Duration  4 weeks    PT Treatment/Interventions  ADLs/Self Care Home Management;Gait training;Stair training;Functional mobility training;Therapeutic activities;Patient/family education;Neuromuscular re-education;Balance training;Therapeutic exercise;Manual techniques    PT Next Visit Plan  Gait training on varied surfaces, initiate initial HEP for hip strengthening on right and standing balance.    Consulted and Agree with Plan of Care  Patient       Patient will benefit from skilled therapeutic intervention in order to improve the following deficits and impairments:  Abnormal gait, Decreased activity tolerance, Decreased balance, Decreased endurance, Decreased mobility, Decreased strength  Visit Diagnosis: Muscle weakness (generalized)  Other abnormalities of gait and mobility     Problem List Patient Active Problem List   Diagnosis Date Noted  . Bacteremia due to Klebsiella pneumoniae 08/24/2019  . Cholangitis   . Choledocholithiasis with acute cholecystitis   . RUQ abdominal pain 08/18/2019  . History of CVA (cerebrovascular accident) 08/18/2019  . Abdominal pain, epigastric 08/17/2019  . Nausea and vomiting 08/17/2019  . Abnormal gallbladder ultrasound 08/17/2019  . Other constipation 08/17/2019  . Left sided lacunar infarction (HCC) 04/04/2019  . COPD (chronic obstructive pulmonary disease) (HCC) 04/06/2018  . GERD (gastroesophageal reflux disease) 04/06/2018  . Hyperlipidemia 07/21/2017  . Gouty arthritis 06/03/2016  . Osteoarthritis of knee 06/03/2016    Ronn MelenaEmily A Jaythan Hinely, PT, DPT, NCS 09/19/2019, 6:33 PM  [facility] [address] [place], [place], [address] Phone: [phone]   Fax:  [phone]  Name: Joseph Orr MRN:  [id number] Date of Birth: 1950/11/30

## 2019-09-20 ENCOUNTER — Other Ambulatory Visit: Payer: Self-pay | Admitting: *Deleted

## 2019-09-20 ENCOUNTER — Encounter: Payer: Self-pay | Admitting: *Deleted

## 2019-09-20 NOTE — Patient Outreach (Addendum)
Telephone outreach unsuccessful, left message and requested a return call.  Eulah Pont. Myrtie Neither, MSN, GNP-BC Gerontological Nurse Practitioner [facility] Care Management [phone]  Joseph Orr returned my call. He continues to make significant improvements in his physical condition. He just started oupt PT. He no longer needs my services but knows he can call me if future needs arise.  We did talk about him completing his advanced directives and he, his wife and daughters have been in discussion over these decisions. He shares that he may be moving back to [place] to live with his daughter there. This is very forward thinking. His wife doesn't drive and if something would happen to him, it would be difficult for his wife. So they have been trying to figure out who to put as the [place]. I suggested Mrs. Ernestine Mcmurray #1, Local daughter #2 for now and daughter in [place] #3. Then when they do go to [place] they can update the order of who is to be called first and second.  [facility] CM Care Plan Problem One     Most Recent Value  Care Plan Problem One  No Advanced Directives  Role Documenting the Problem One  Care Management Coordinator  Care Plan for Problem One  Not Active  [facility] CM Short Term Goal #1   Pt will recieve and complete HCPOA and Living Will documents in the next 30 days.  TH[facility] CM Short Term Goal #1 Start Date  09/01/19  [facility] CM Short Term Goal #1 Met Date  09/20/19  Interventions for Short Term Goal #1  Advised the documents would be good in most states. Also they can amend the sequence of who to call after they do actually move.  [facility] CM Short Term Goal #2   Pt will provide new primary care provider a copy of his documents.  [facility]N CM Short Term Goal #2 Start Date  09/01/19  [facility] CM Short Term Goal #2 Met Date  09/08/19    [facility] CM Care Plan Problem Two     Most Recent Value  Care Plan Problem Two  Needs new primary care provider.  Role Documenting the Problem Two  Care Management Coordinator   Care Plan for Problem Two  Not Active  THN CM Short Term Goal #1   Pt will go to [facility] site and look at providers, select one and make appt for introduction.  THN CM Short Term Goal #1 Start Date  09/01/19  [facility] CM Short Term Goal #1 Met Date   09/08/19    [facility] CM Care Plan Problem Three     Most Recent Value  Care Plan Problem Three  Pt needs increased exercise time on daily basis.  Role Documenting the Problem Three  Care Management Coordinator  Care Plan for Problem Three  Not Active  THN Long Term Goal   Pt will increase his exercise time to 30 minutes for 3 days or more over the next 45 days.  THN Long Term Goal Start Date  09/01/19  [facility]N Long Term Goal Met Date  09/20/19  Interventions for Problem Three Long Term Goal  continue new exercise habits.     Pt has met all his care plan goals, closing his case. Pt knows he can call me.  Eulah Pont. Myrtie Neither, MSN, [facility] Gerontological Nurse Practitioner [facility] Care Management [phone]

## 2019-09-28 ENCOUNTER — Other Ambulatory Visit: Payer: Self-pay | Admitting: Family Medicine

## 2019-09-30 ENCOUNTER — Telehealth: Payer: Self-pay | Admitting: Family Medicine

## 2019-09-30 NOTE — Telephone Encounter (Signed)
rx refill  albuterol (PROVENTIL HFA;VENTOLIN HFA) 108 (90 Base) MCG/ACT inhaler   RX REFILL  [facility] [address] [place] [phone] (Phone) [phone] (Fax)

## 2019-10-03 MED ORDER — ALBUTEROL SULFATE HFA 108 (90 BASE) MCG/ACT IN AERS: 2.0000 | INHALATION_SPRAY | RESPIRATORY_TRACT | 1 refills | Status: AC | PRN

## 2019-10-03 NOTE — Telephone Encounter (Signed)
Rx sent in

## 2019-10-03 NOTE — Addendum Note (Signed)
Addended by: Rodrigo Ran on: 10/03/2019 07:52 AM   Modules accepted: Orders

## 2019-10-04 ENCOUNTER — Other Ambulatory Visit: Payer: Self-pay

## 2019-10-04 ENCOUNTER — Encounter: Payer: Self-pay | Admitting: Physical Therapy

## 2019-10-04 ENCOUNTER — Ambulatory Visit: Payer: Medicare HMO | Attending: Internal Medicine | Admitting: Physical Therapy

## 2019-10-04 DIAGNOSIS — R2689 Other abnormalities of gait and mobility: Secondary | ICD-10-CM | POA: Diagnosis not present

## 2019-10-04 DIAGNOSIS — M6281 Muscle weakness (generalized): Secondary | ICD-10-CM | POA: Insufficient documentation

## 2019-10-04 NOTE — Patient Instructions (Signed)
Access Code: [email or web address]  URL: [email or web address]  Date: 10/04/2019  Prepared by: Willow Ora   Exercises Sit to/from Stand in Stride position - 10 reps - 1 sets - 1x daily - 5x weekly Standing Single Leg Stance with Counter Support - 3 reps - 1 sets  - 15 hold - 1x daily - 5x weekly Tandem Walking with Counter Support - 2 reps - 1 sets - 1x daily - 5x weekly Toe Walking with Counter Support - 2 reps - 1 sets - 1x daily - 5x weekly

## 2019-10-04 NOTE — Therapy (Signed)
[facility] [address] [place], [place], [address] Phone: [phone]   Fax:  [phone]  Physical Therapy Treatment  Patient Details  Name: Joseph Orr MRN: [id number] Date of Birth: [birth year] Referring Provider (PT): referred by Tiana Loft but PCP Letta Median   Encounter Date: 10/04/2019  PT End of Session - 10/04/19 0803    Visit Number  2    Number of Visits  13    Date for PT Re-Evaluation  11/25/19    Authorization Type  aetna medicare 10th visit progress note    PT Start Time  0801    PT Stop Time  0840    PT Time Calculation (min)  39 min    Equipment Utilized During Treatment  Gait belt    Activity Tolerance  Patient tolerated treatment well;No increased pain    Behavior During Therapy  WFL for tasks assessed/performed       Past Medical History:  Diagnosis Date  . Arthritis   . CVA (cerebral vascular accident) ([place]) 03/03/2019  . GERD (gastroesophageal reflux disease)   . Gout   . TIA (transient ischemic attack) 02/2019    Past Surgical History:  Procedure Laterality Date  . CHOLECYSTECTOMY N/A 08/22/2019   Procedure: LAPAROSCOPIC CHOLECYSTECTOMY;  Surgeon: Clovis Riley, MD;  Location: [facility];  Service: General;  Laterality: N/A;  . COLONOSCOPY     +8y  . ERCP N/A 08/21/2019   Procedure: ENDOSCOPIC RETROGRADE CHOLANGIOPANCREATOGRAPHY (ERCP);  Surgeon: Carol Ada, MD;  Location: [facility];  Service: Endoscopy;  Laterality: N/A;  . KIDNEY DONATION    . KNEE ARTHROSCOPY    . REMOVAL OF STONES  08/21/2019   Procedure: REMOVAL OF STONES;  Surgeon: Carol Ada, MD;  Location: [facility] ENDOSCOPY;  Service: Endoscopy;;  . [name]  08/21/2019   Procedure: [name];  Surgeon: Carol Ada, MD;  Location: [facility];  Service: Endoscopy;;    There were no vitals filed for this visit.  Subjective Assessment - 10/04/19 0802    Subjective  No new complaints. No  falls. Some abdominal pain today that he reports comes and goes.    Pertinent History  hyperlipidemia, GERD, gouty arthritis    Patient Stated Goals  Pt would like to walk better and father.    Currently in Pain?  Yes    Pain Score  3     Pain Location  Abdomen    Pain Orientation  Right;Lateral    Pain Descriptors / Indicators  Sore    Pain Type  Surgical pain    Pain Onset  1 to 4 weeks ago    Pain Frequency  Intermittent    Aggravating Factors   moving certain positions           [facility] Adult PT Treatment/Exercise - 10/04/19 0818      Ambulation/Gait   Ambulation/Gait  Yes    Ambulation/Gait Assistance  5: Supervision;4: Min guard    Ambulation/Gait Assistance Details  gait around track with speed changes, scanning all directions randomly with supervision to min guard assist for safety.      Ambulation Distance (Feet)  230 Feet   x1, plus around gym   Assistive device  None    Gait Pattern  Step-through pattern;Decreased step length - right;Decreased step length - left    Ambulation Surface  Level;Indoor      Self-Care   Self-Care  Other Self-Care Comments    Other Self-Care Comments   issued HEP for  strengthening and balance. min guard assist for balance with cues on form/technique.             Balance Exercises - 10/04/19 0829      Balance Exercises: Standing   Standing Eyes Closed  Wide (BOA);Head turns;Foam/compliant surface;Other reps (comment);30 secs;Limitations    Tandem Stance  Eyes closed;Foam/compliant surface;Intermittent upper extremity support;3 reps;30 secs;Limitations   modified tandem     Balance Exercises: Standing   Standing Eyes Closed Limitations  on airex in corner with chair in front for safety: feet hip width apart for EC no head movements, progressing to EC head movements left<>right, up<>down. min guard to min assist for balance. cues on posture/weight shifting to assist with balance.      Tandem Stance Limitations  on airex in corner with  chair in front for safety: modified tandem stance with EC for 30 sec's for 3 reps each foot forward. occasional touch to chair/wall for balance assistance. min guard to min assist for balance with cues on posture and weight shifting        Access Code: [email or web address]  URL: [email or web address]  Date: 10/04/2019  Prepared by: Sallyanne Kuster   Exercises Sit to/from Stand in Stride position - 10 reps - 1 sets - 1x daily - 5x weekly Standing Single Leg Stance with Counter Support - 3 reps - 1 sets  - 15 hold - 1x daily - 5x weekly Tandem Walking with Counter Support - 2 reps - 1 sets - 1x daily - 5x weekly Toe Walking with Counter Support - 2 reps - 1 sets - 1x daily - 5x weekly  PT Education - 10/04/19 1340    Education Details  Issued HEP for strengthening and balance today.    Person(s) Educated  Patient    Methods  Explanation;Demonstration;Verbal cues;Handout    Comprehension  Verbalized understanding;Returned demonstration;Verbal cues required;Need further instruction       PT Short Term Goals - 09/19/19 1822      PT SHORT TERM GOAL #1   Title  Pt will be independent with strengthening and balance HEP to continue gains on own.    Time  4    Period  Weeks    Status  New    Target Date  10/19/19      PT SHORT TERM GOAL #2   Title  Pt will increase gait speed to >1.49m/s for improved community ambulation.    Baseline  0.67m/s on 11/16/20204    Time  4    Period  Weeks    Status  New        PT Long Term Goals - 09/19/19 1824      PT LONG TERM GOAL #1   Title  Pt will be able to maintain SLS >10 sec bilateral for improved balance.    Time  8    Period  Weeks    Status  New    Target Date  11/18/19      PT LONG TERM GOAL #2   Title  Pt will report <4/10 RPE with functional activiites for improved activity tolerance.    Time  8    Period  Weeks    Status  New    Target Date  11/18/19      PT LONG TERM GOAL #3   Title  Pt will ambulate up/down steps with  reciprocal pattern without rail independently for improved functional strength and balance.    Time  8    Period  Weeks  Status  New    Target Date  11/18/19      PT LONG TERM GOAL #4   Title  Pt will ambulate x 10 min for improved activity tolerance and community access.    Time  8    Period  Weeks    Status  New    Target Date  11/18/19          Plan - 10/04/19 0803    Clinical Impression Statement  Today's skilled session initially focused on establishment of an HEP to address strengthening and balance with no issues reported or noted in session. Remainder of session focused on dynamic gait and balance reactions with no issues reported or noted. The pt is making steady progress toward goals and should benefit from continued PT to progress toward unmet goals.    Personal Factors and Comorbidities  Comorbidity 3+    Comorbidities  hyperlipidemia, GERD, gouty arthritis    Examination-Activity Limitations  Locomotion Level;Stairs    Examination-Participation Restrictions  Community Activity    Stability/Clinical Decision Making  Evolving/Moderate complexity    Rehab Potential  Good    PT Frequency  2x / week   followed by 1x/wk for 4 weeks if needed   PT Duration  4 weeks    PT Treatment/Interventions  ADLs/Self Care Home Management;Gait training;Stair training;Functional mobility training;Therapeutic activities;Patient/family education;Neuromuscular re-education;Balance training;Therapeutic exercise;Manual techniques    PT Next Visit Plan  continue to work on LE strengthening, emphasis on right hip; balance reactions both static and dynamic on compliant surfaces    PT Home Exercise Plan  Access Code: [email or web address]    Consulted and Agree with Plan of Care  Patient       Patient will benefit from skilled therapeutic intervention in order to improve the following deficits and impairments:  Abnormal gait, Decreased activity tolerance, Decreased balance, Decreased endurance, Decreased  mobility, Decreased strength  Visit Diagnosis: Muscle weakness (generalized)  Other abnormalities of gait and mobility     Problem List Patient Active Problem List   Diagnosis Date Noted  . Bacteremia due to Klebsiella pneumoniae 08/24/2019  . Cholangitis   . Choledocholithiasis with acute cholecystitis   . RUQ abdominal pain 08/18/2019  . History of CVA (cerebrovascular accident) 08/18/2019  . Abdominal pain, epigastric 08/17/2019  . Nausea and vomiting 08/17/2019  . Abnormal gallbladder ultrasound 08/17/2019  . Other constipation 08/17/2019  . Left sided lacunar infarction (H[facility]) 04/04/2019  . COPD (chronic obstructive pulmonary disease) (H[facility]) 04/06/2018  . GERD (gastroesophageal reflux disease) 04/06/2018  . Hyperlipidemia 07/21/2017  . Gouty arthritis 06/03/2016  . Osteoarthritis of knee 06/03/2016    Sallyanne KusterKathy Bury, PTA, [facility] [address], [address] [place], [place] [address] [phone] 10/04/19, 1:44 PM   Name: Joseph Orr MRN: [id number] Date of Birth: 1951/02/03

## 2019-10-07 ENCOUNTER — Other Ambulatory Visit: Payer: Self-pay

## 2019-10-07 ENCOUNTER — Ambulatory Visit: Payer: Medicare HMO

## 2019-10-07 DIAGNOSIS — R2689 Other abnormalities of gait and mobility: Secondary | ICD-10-CM

## 2019-10-07 DIAGNOSIS — M6281 Muscle weakness (generalized): Secondary | ICD-10-CM | POA: Diagnosis not present

## 2019-10-07 NOTE — Patient Instructions (Signed)
Access Code: [email or web address]  URL: [email or web address]  Date: 10/07/2019  Prepared by: Cherly Anderson   Exercises Sit to/from Stand in Stride position - 10 reps - 1 sets - 1x daily - 5x weekly Standing Single Leg Stance with Counter Support - 3 reps - 1 sets                   - 15 hold - 1x daily - 5x weekly Tandem Walking with Counter Support - 2 reps - 1 sets - 1x daily - 5x weekly Toe Walking with Counter Support - 2 reps - 1 sets - 1x daily - 5x weekly Heel Walking with Counter Support - 2 reps - 1 sets - 1x daily - 5x weekly Walking March - 2 reps - 1 sets - 1x daily - 5x weekly Standing Balance in Corner with Eyes Closed - 2 reps - 1 sets - 30 sec hold - 2x daily - 5x weekly

## 2019-10-07 NOTE — Therapy (Signed)
[facility] [address] [place], [place], [address] Phone: [phone]   Fax:  [phone]  Physical Therapy Treatment  Patient Details  Name: Joseph Orr MRN: [id number] Date of Birth: [birth year] Referring Provider (PT): referred by Gwinda Passe but PCP Luana Shu   Encounter Date: 10/07/2019  PT End of Session - 10/07/19 0758    Visit Number  3    Number of Visits  13    Date for PT Re-Evaluation  11/25/19    Authorization Type  aetna medicare 10th visit progress note    PT Start Time  0757    PT Stop Time  0842    PT Time Calculation (min)  45 min    Equipment Utilized During Treatment  Gait belt    Activity Tolerance  Patient tolerated treatment well;No increased pain    Behavior During Therapy  WFL for tasks assessed/performed       Past Medical History:  Diagnosis Date  . Arthritis   . CVA (cerebral vascular accident) ([facility]) 03/03/2019  . GERD (gastroesophageal reflux disease)   . Gout   . TIA (transient ischemic attack) 02/2019    Past Surgical History:  Procedure Laterality Date  . CHOLECYSTECTOMY N/A 08/22/2019   Procedure: LAPAROSCOPIC CHOLECYSTECTOMY;  Surgeon: Berna Bue, MD;  Location: [facility] OR;  Service: General;  Laterality: N/A;  . COLONOSCOPY     +8y  . ERCP N/A 08/21/2019   Procedure: ENDOSCOPIC RETROGRADE CHOLANGIOPANCREATOGRAPHY (ERCP);  Surgeon: Jeani Hawking, MD;  Location: [facility] ENDOSCOPY;  Service: Endoscopy;  Laterality: N/A;  . KIDNEY DONATION    . KNEE ARTHROSCOPY    . REMOVAL OF STONES  08/21/2019   Procedure: REMOVAL OF STONES;  Surgeon: Jeani Hawking, MD;  Location: [facility] ENDOSCOPY;  Service: Endoscopy;;  . Dennison Mascot  08/21/2019   Procedure: Dennison Mascot;  Surgeon: Jeani Hawking, MD;  Location: [facility] ENDOSCOPY;  Service: Endoscopy;;    There were no vitals filed for this visit.  Subjective Assessment - 10/07/19 0758    Subjective  Pt reports that his  muscles are talking to him since doing new exercises.    Pertinent History  hyperlipidemia, GERD, gouty arthritis    Patient Stated Goals  Pt would like to walk better and father.    Currently in Pain?  No/denies    Pain Onset  1 to 4 weeks ago                       [facility] Adult PT Treatment/Exercise - 10/07/19 0759      Ambulation/Gait   Ambulation/Gait  Yes    Ambulation/Gait Assistance  7: Independent    Ambulation Distance (Feet)  80 Feet   in to clinic   Assistive device  None    Gait Pattern  Step-through pattern;Decreased step length - right;Decreased step length - left    Ambulation Surface  Level;Indoor      Neuro Re-ed    Neuro Re-ed Details   Pt performed dynamic gait activities without UE support:  Marching gait 6' x 6, backwards gait 6' x 6, tandem gait 6' x 6, toe walking 6' x 4, heel walking 6' x 4 with some supination noted. Standing on airex: feet together eyes closed x 30 sec, eyes closed x 30 sec but repeated LOB to right and anterior needing to touch often to catch, feet together with eyes open with head turns left/right x 10, up/down x 10. Decreased stabiliity with horizontal head  turns. Standing on airex with feet apart eyes closed x 30 sec. Pt stood on rockerboard positioned ant/post trying to maintain level  x 30 sec eyes open then attempted eyes closed but had decreased stability having to touch losing balance anterior, weight shifting ant/ post x 10 with less shift posterior. Rockerboard positioned lateral maintaining level then moving 3 cones side to side x 2 while keeping balance.  BP=112/72 after exercises. Close SBA/CGA with balance on compliant surfaces.             PT Education - 10/07/19 0941    Education Details  Added to balance HEP    Person(s) Educated  Patient    Methods  Explanation;Handout;Demonstration    Comprehension  Verbalized understanding;Returned demonstration       PT Short Term Goals - 09/19/19 1822      PT SHORT  TERM GOAL #1   Title  Pt will be independent with strengthening and balance HEP to continue gains on own.    Time  4    Period  Weeks    Status  New    Target Date  10/19/19      PT SHORT TERM GOAL #2   Title  Pt will increase gait speed to >1.3154m/s for improved community ambulation.    Baseline  0.8052m/s on 11/16/20204    Time  4    Period  Weeks    Status  New        PT Long Term Goals - 09/19/19 1824      PT LONG TERM GOAL #1   Title  Pt will be able to maintain SLS >10 sec bilateral for improved balance.    Time  8    Period  Weeks    Status  New    Target Date  11/18/19      PT LONG TERM GOAL #2   Title  Pt will report <4/10 RPE with functional activiites for improved activity tolerance.    Time  8    Period  Weeks    Status  New    Target Date  11/18/19      PT LONG TERM GOAL #3   Title  Pt will ambulate up/down steps with reciprocal pattern without rail independently for improved functional strength and balance.    Time  8    Period  Weeks    Status  New    Target Date  11/18/19      PT LONG TERM GOAL #4   Title  Pt will ambulate x 10 min for improved activity tolerance and community access.    Time  8    Period  Weeks    Status  New    Target Date  11/18/19            Plan - 10/07/19 [id number]    Clinical Impression Statement  Pt was challenged with balance activities that relied more on vestibular system especially when vision component removed. Lost balance anterior almost every time with eyes closed on compliant surfaces.    Personal Factors and Comorbidities  Comorbidity 3+    Comorbidities  hyperlipidemia, GERD, gouty arthritis    Examination-Activity Limitations  Locomotion Level;Stairs    Examination-Participation Restrictions  Community Activity    Stability/Clinical Decision Making  Evolving/Moderate complexity    Rehab Potential  Good    PT Frequency  2x / week   followed by 1x/wk for 4 weeks if needed   PT Duration  4 weeks  PT  Treatment/Interventions  ADLs/Self Care Home Management;Gait training;Stair training;Functional mobility training;Therapeutic activities;Patient/family education;Neuromuscular re-education;Balance training;Therapeutic exercise;Manual techniques    PT Next Visit Plan  continue to work on LE strengthening, emphasis on right hip adding to HEP perhaps sidelying hip abd; balance reactions both static and dynamic on compliant surfaces to try to work on vestibular component more.    PT Home Exercise Plan  Access Code: [email or web address]    [email or web address] and Agree with Plan of Care  Patient       Patient will benefit from skilled therapeutic intervention in order to improve the following deficits and impairments:  Abnormal gait, Decreased activity tolerance, Decreased balance, Decreased endurance, Decreased mobility, Decreased strength  Visit Diagnosis: Other abnormalities of gait and mobility  Muscle weakness (generalized)     Problem List Patient Active Problem List   Diagnosis Date Noted  . Bacteremia due to Klebsiella pneumoniae 08/24/2019  . Cholangitis   . Choledocholithiasis with acute cholecystitis   . RUQ abdominal pain 08/18/2019  . History of CVA (cerebrovascular accident) 08/18/2019  . Abdominal pain, epigastric 08/17/2019  . Nausea and vomiting 08/17/2019  . Abnormal gallbladder ultrasound 08/17/2019  . Other constipation 08/17/2019  . Left sided lacunar infarction ([place]) 04/04/2019  . COPD (chronic obstructive pulmonary disease) ([place]) 04/06/2018  . GERD (gastroesophageal reflux disease) 04/06/2018  . Hyperlipidemia 07/21/2017  . Gouty arthritis 06/03/2016  . Osteoarthritis of knee 06/03/2016    Electa Sniff, PT, DPT, NCS 10/07/2019, 9:46 AM  [facility] [address] [place], [place], [address] Phone: [phone]   Fax:  [phone]  Name: Joseph Orr MRN: [id number] Date of Birth: 23-Oct-1951

## 2019-10-12 ENCOUNTER — Ambulatory Visit: Payer: Medicare HMO | Admitting: Physical Therapy

## 2019-10-12 ENCOUNTER — Other Ambulatory Visit: Payer: Self-pay

## 2019-10-12 DIAGNOSIS — M6281 Muscle weakness (generalized): Secondary | ICD-10-CM | POA: Diagnosis not present

## 2019-10-12 DIAGNOSIS — R2689 Other abnormalities of gait and mobility: Secondary | ICD-10-CM | POA: Diagnosis not present

## 2019-10-13 NOTE — Therapy (Signed)
[facility] [address] [place], [place], [address] Phone: [phone]   Fax:  [phone]  Physical Therapy Treatment  Patient Details  Name: Joseph Orr MRN: [id number] Date of Birth: [birth year] Referring Provider (PT): referred by Gwinda Passe but PCP Luana Shu   Encounter Date: 10/12/2019  PT End of Session - 10/12/19 0721    Visit Number  4    Number of Visits  13    Date for PT Re-Evaluation  11/25/19    Authorization Type  aetna medicare 10th visit progress note    PT Start Time  0718    PT Stop Time  0800    PT Time Calculation (min)  42 min    Equipment Utilized During Treatment  Gait belt    Activity Tolerance  Patient tolerated treatment well;No increased pain    Behavior During Therapy  WFL for tasks assessed/performed       Past Medical History:  Diagnosis Date  . Arthritis   . CVA (cerebral vascular accident) (HCC) 03/03/2019  . GERD (gastroesophageal reflux disease)   . Gout   . TIA (transient ischemic attack) 02/2019    Past Surgical History:  Procedure Laterality Date  . CHOLECYSTECTOMY N/A 08/22/2019   Procedure: LAPAROSCOPIC CHOLECYSTECTOMY;  Surgeon: Berna Bue, MD;  Location: [facility] OR;  Service: General;  Laterality: N/A;  . COLONOSCOPY     +8y  . ERCP N/A 08/21/2019   Procedure: ENDOSCOPIC RETROGRADE CHOLANGIOPANCREATOGRAPHY (ERCP);  Surgeon: Jeani Hawking, MD;  Location: [facility] ENDOSCOPY;  Service: Endoscopy;  Laterality: N/A;  . KIDNEY DONATION    . KNEE ARTHROSCOPY    . REMOVAL OF STONES  08/21/2019   Procedure: REMOVAL OF STONES;  Surgeon: Jeani Hawking, MD;  Location: [facility] ENDOSCOPY;  Service: Endoscopy;;  . Dennison Mascot  08/21/2019   Procedure: Dennison Mascot;  Surgeon: Jeani Hawking, MD;  Location: [facility] ENDOSCOPY;  Service: Endoscopy;;    There were no vitals filed for this visit.  Subjective Assessment - 10/12/19 0720    Subjective  No new complaints. No  falls or pain to report. Legs were sore after last session.    Patient Stated Goals  Pt would like to walk better and father.    Currently in Pain?  No/denies    Pain Score  0-No pain           [facility] Adult PT Treatment/Exercise - 10/12/19 0724      Transfers   Transfers  Sit to Stand;Stand to Sit    Sit to Stand  7: Independent    Stand to Sit  7: Independent      Ambulation/Gait   Ambulation/Gait  Yes    Ambulation/Gait Assistance  5: Supervision    Ambulation/Gait Assistance Details  gait around track with head turns, speed changes, sudden stop/starts and turns with supervision. no balance loss noted.     Ambulation Distance (Feet)  230 Feet   x1   Assistive device  None    Gait Pattern  Step-through pattern;Decreased step length - right;Decreased step length - left    Ambulation Surface  Level;Indoor      High Level Balance   High Level Balance Activities  Marching forwards;Marching backwards;Tandem walking   tandem/toe/heel walking  fwd/bwd   High Level Balance Comments  red/blue mats next to counter top: 3 laps each way with min guard assist, cues on form/technique. intermittent touch to counter for balance.       Exercises   Exercises  Other Exercises    Other Exercises   for right hip strengthening: left sidelying right hip abduction for 10 reps, pt reports easy, no issues noted;  standing 4 way kicks with green band resistance with right LE for 10 reps each way, min guard assist with UE support, cues on form/technique.           Balance Exercises - 10/12/19 0755      Balance Exercises: Standing   Standing Eyes Closed  Narrow base of support (BOS);Wide (BOA);Head turns;Foam/compliant surface;Other reps (comment);30 secs;Limitations      Balance Exercises: Standing   Standing Eyes Closed Limitations  on airex with no UE support: feet together for EC no head movements, progressing to feet hip width apart for EC head movements left<>right, up<>down. min guard assist  with cues on posture and weight shifitng for balance assistance.         PT Education - 10/13/19 0939    Education Details  added 4 way hip kicks with resistance band to HEP today.    Person(s) Educated  Patient    Methods  Explanation;Demonstration;Verbal cues;Handout    Comprehension  Verbalized understanding;Returned demonstration       PT Short Term Goals - 09/19/19 1822      PT SHORT TERM GOAL #1   Title  Pt will be independent with strengthening and balance HEP to continue gains on own.    Time  4    Period  Weeks    Status  New    Target Date  10/19/19      PT SHORT TERM GOAL #2   Title  Pt will increase gait speed to >1.63m/s for improved community ambulation.    Baseline  0.71m/s on 11/16/20204    Time  4    Period  Weeks    Status  New        PT Long Term Goals - 09/19/19 1824      PT LONG TERM GOAL #1   Title  Pt will be able to maintain SLS >10 sec bilateral for improved balance.    Time  8    Period  Weeks    Status  New    Target Date  11/18/19      PT LONG TERM GOAL #2   Title  Pt will report <4/10 RPE with functional activiites for improved activity tolerance.    Time  8    Period  Weeks    Status  New    Target Date  11/18/19      PT LONG TERM GOAL #3   Title  Pt will ambulate up/down steps with reciprocal pattern without rail independently for improved functional strength and balance.    Time  8    Period  Weeks    Status  New    Target Date  11/18/19      PT LONG TERM GOAL #4   Title  Pt will ambulate x 10 min for improved activity tolerance and community access.    Time  8    Period  Weeks    Status  New    Target Date  11/18/19            Plan - 10/12/19 0721    Clinical Impression Statement  Today's skilled session focused on LE strengthening and balance reactions. Pt is most challenged by complaint surfaces and activities that require increased vestibular system imput for balance. Added 4 way hip kicks to HEP to address hip  strengthening. The pt is making steady progress and should benefit from continued PT to progress toward unmet goals.    Personal Factors and Comorbidities  Comorbidity 3+    Comorbidities  hyperlipidemia, GERD, gouty arthritis    Examination-Activity Limitations  Locomotion Level;Stairs    Examination-Participation Restrictions  Community Activity    Stability/Clinical Decision Making  Evolving/Moderate complexity    Rehab Potential  Good    PT Frequency  2x / week   followed by 1x/wk for 4 weeks if needed   PT Duration  4 weeks    PT Treatment/Interventions  ADLs/Self Care Home Management;Gait training;Stair training;Functional mobility training;Therapeutic activities;Patient/family education;Neuromuscular re-education;Balance training;Therapeutic exercise;Manual techniques    PT Home Exercise Plan  Access Code: [email or web address]    Consulted and Agree with Plan of Care  Patient       Patient will benefit from skilled therapeutic intervention in order to improve the following deficits and impairments:  Abnormal gait, Decreased activity tolerance, Decreased balance, Decreased endurance, Decreased mobility, Decreased strength  Visit Diagnosis: Other abnormalities of gait and mobility  Muscle weakness (generalized)     Problem List Patient Active Problem List   Diagnosis Date Noted  . Bacteremia due to Klebsiella pneumoniae 08/24/2019  . Cholangitis   . Choledocholithiasis with acute cholecystitis   . RUQ abdominal pain 08/18/2019  . History of CVA (cerebrovascular accident) 08/18/2019  . Abdominal pain, epigastric 08/17/2019  . Nausea and vomiting 08/17/2019  . Abnormal gallbladder ultrasound 08/17/2019  . Other constipation 08/17/2019  . Left sided lacunar infarction (HCC) 04/04/2019  . COPD (chronic obstructive pulmonary disease) (HCC) 04/06/2018  . GERD (gastroesophageal reflux disease) 04/06/2018  . Hyperlipidemia 07/21/2017  . Gouty arthritis 06/03/2016  . Osteoarthritis of knee  06/03/2016    Sallyanne KusterKathy Bury, PTA, [facility] [address], [address] [place], [place] [address] [phone] 10/13/19, 9:39 AM   Name: Troy SineRichard Lee Desouza MRN: [id number] Date of Birth: December 07, 1950

## 2019-10-14 ENCOUNTER — Ambulatory Visit: Payer: Medicare HMO

## 2019-10-17 ENCOUNTER — Ambulatory Visit: Payer: Medicare HMO

## 2019-10-17 ENCOUNTER — Other Ambulatory Visit: Payer: Self-pay

## 2019-10-17 DIAGNOSIS — M6281 Muscle weakness (generalized): Secondary | ICD-10-CM | POA: Diagnosis not present

## 2019-10-17 DIAGNOSIS — R2689 Other abnormalities of gait and mobility: Secondary | ICD-10-CM | POA: Diagnosis not present

## 2019-10-17 NOTE — Patient Instructions (Signed)
Access Code: [email or web address]  URL: [email or web address]  Date: 10/17/2019  Prepared by: Cherly Anderson   Exercises Sit to/from Stand in Stride position - 10 reps - 1 sets - 1x daily - 5x weekly Standing Single Leg Stance with Counter Support - 3 reps - 1 sets                   - 15 hold - 1x daily - 5x weekly Tandem Walking with Counter Support - 2 reps - 1 sets - 1x daily - 5x weekly Toe Walking with Counter Support - 2 reps - 1 sets - 1x daily - 5x weekly Heel Walking with Counter Support - 2 reps - 1 sets - 1x daily - 5x weekly Walking March - 2 reps - 1 sets - 1x daily - 5x weekly Standing Balance in Corner with Eyes Closed - 2 reps - 1 sets - 30 sec hold - 2x daily - 5x weekly Standing Repeated Hip Flexion with Resistance - 10 reps - 1 sets - 1x daily - 5x weekly Standing Repeated Hip Abduction with Resistance - 10 reps - 1 sets - 1x daily - 5x weekly Standing Repeated Hip Extension with Resistance - 10 reps - 1 sets - 1x daily - 5x weekly Standing Repeated Hip Adduction with Resistance - 10 reps - 1 sets - 1x daily - 5x weekly Sidelying Hip Abduction - 10 reps - 3 sets - 1x daily - 5x weekly

## 2019-10-17 NOTE — Therapy (Signed)
[facility] [address] [place], [place], [address] Phone: [phone]   Fax:  [phone]  Physical Therapy Treatment  Patient Details  Name: Joseph Orr MRN: [id number] Date of Birth: 1950-11-16 Referring Provider (PT): referred by Tiana Loft but PCP Letta Median   Encounter Date: 10/17/2019  PT End of Session - 10/17/19 0803    Visit Number  5    Number of Visits  13    Date for PT Re-Evaluation  11/25/19    Authorization Type  aetna medicare 10th visit progress note    PT Start Time  0802    PT Stop Time  0842    PT Time Calculation (min)  40 min    Equipment Utilized During Treatment  Gait belt    Activity Tolerance  Patient tolerated treatment well;No increased pain    Behavior During Therapy  WFL for tasks assessed/performed       Past Medical History:  Diagnosis Date  . Arthritis   . CVA (cerebral vascular accident) ([place]) 03/03/2019  . GERD (gastroesophageal reflux disease)   . Gout   . TIA (transient ischemic attack) 02/2019    Past Surgical History:  Procedure Laterality Date  . CHOLECYSTECTOMY N/A 08/22/2019   Procedure: LAPAROSCOPIC CHOLECYSTECTOMY;  Surgeon: Clovis Riley, MD;  Location: [facility] OR;  Service: General;  Laterality: N/A;  . COLONOSCOPY     +8y  . ERCP N/A 08/21/2019   Procedure: ENDOSCOPIC RETROGRADE CHOLANGIOPANCREATOGRAPHY (ERCP);  Surgeon: Carol Ada, MD;  Location: [place] [facility];  Service: Endoscopy;  Laterality: N/A;  . KIDNEY DONATION    . KNEE ARTHROSCOPY    . REMOVAL OF STONES  08/21/2019   Procedure: REMOVAL OF STONES;  Surgeon: Carol Ada, MD;  Location: [facility] ENDOSCOPY;  Service: Endoscopy;;  . [name]  08/21/2019   Procedure: [name];  Surgeon: Carol Ada, MD;  Location: [facility];  Service: Endoscopy;;    There were no vitals filed for this visit.  Subjective Assessment - 10/17/19 0803    Subjective  Pt reports that he is  doing well. Reports that he cancelled Friday as muscles were sore from therapy session. Lasted only a day or 2.    Patient Stated Goals  Pt would like to walk better and father.    Currently in Pain?  No/denies                       [facility] Adult PT Treatment/Exercise - 10/17/19 0809      Ambulation/Gait   Ambulation/Gait  Yes    Ambulation/Gait Assistance  7: Independent    Ambulation Distance (Feet)  230 Feet    Assistive device  None    Gait Pattern  Step-through pattern    Ambulation Surface  Level;Indoor    Stairs  Yes    Stairs Assistance  6: Modified independent (Device/Increase time)    Stair Management Technique  Alternating pattern;One rail Right;Two rails    Number of Stairs  12    Gait Comments  Pt performed 4 steps first time with bilateral rails in reciprocal pattern mod I and same with 1 rail. With no rails needed occasional UE support with decreased stability and pausing      Neuro Re-ed    Neuro Re-ed Details   Along counter: marching without UE support 6' forward and then backwards gait 6' repeated x 3 laps. Tandem gait 6' x 6 with occasional UE support supervision. Step-ups on 6" step without  UE support then lowering down to forward off step to work on eccentric control without UE support x 6 each leg.. Dynamic gait activities in hallway: marching gait 30' x 1, large steps 30' x 1, gait with head turns up/dow 30' x 2 and left/right 30' x 2, gait with eyes closed 30' x 1, side stepping 15' x 1 each side. BP=118/72 after. 3/10 RPE after. Pt performed standing on airex with feet together: eyes closed x 30 sec then adding head turns x 10 eyes open and x 10 eyes closed side to side.      Exercises   Exercises  Other Exercises    Other Exercises   Sidelying hip abd x 10 with verbal and tactile cues for form             PT Education - 10/17/19 1216    Education Details  Added to hip strengthening.    Person(s) Educated  Patient    Methods   Explanation;Handout;Demonstration    Comprehension  Verbalized understanding;Returned demonstration       PT Short Term Goals - 10/17/19 0805      PT SHORT TERM GOAL #1   Title  Pt will be independent with strengthening and balance HEP to continue gains on own.    Baseline  Pt reports that he has been doing his exercises.    Time  4    Period  Weeks    Status  Achieved    Target Date  10/19/19      PT SHORT TERM GOAL #2   Title  Pt will increase gait speed to >1.79ms for improved community ambulation.    Baseline  0.940m on 11/16/20204, 1.52m48mon 10/17/2019    Time  4    Period  Weeks    Status  Achieved        PT Long Term Goals - 09/19/19 1824      PT LONG TERM GOAL #1   Title  Pt will be able to maintain SLS >10 sec bilateral for improved balance.    Time  8    Period  Weeks    Status  New    Target Date  11/18/19      PT LONG TERM GOAL #2   Title  Pt will report <4/10 RPE with functional activiites for improved activity tolerance.    Time  8    Period  Weeks    Status  New    Target Date  11/18/19      PT LONG TERM GOAL #3   Title  Pt will ambulate up/down steps with reciprocal pattern without rail independently for improved functional strength and balance.    Time  8    Period  Weeks    Status  New    Target Date  11/18/19      PT LONG TERM GOAL #4   Title  Pt will ambulate x 10 min for improved activity tolerance and community access.    Time  8    Period  Weeks    Status  New    Target Date  11/18/19            Plan - 10/17/19 1217    Clinical Impression Statement  Pt continues to show improvement in stability with gait activities. Can tell patient has been working on HEP at home. Met gait speed goal showing safe community ambulator speed.    Personal Factors and Comorbidities  Comorbidity 3+  Comorbidities  hyperlipidemia, GERD, gouty arthritis    Examination-Activity Limitations  Locomotion Level;Stairs    Examination-Participation  Restrictions  Community Activity    Stability/Clinical Decision Making  Evolving/Moderate complexity    Rehab Potential  Good    PT Frequency  2x / week   followed by 1x/wk for 4 weeks if needed   PT Duration  4 weeks    PT Treatment/Interventions  ADLs/Self Care Home Management;Gait training;Stair training;Functional mobility training;Therapeutic activities;Patient/family education;Neuromuscular re-education;Balance training;Therapeutic exercise;Manual techniques    PT Next Visit Plan  Assess LTG next visit early as patient progressing faster than anticipated., dynamic balance and static balance with more vestibular component.    PT Home Exercise Plan  Access Code: [email or web address]    [email or web address] and Agree with Plan of Care  Patient       Patient will benefit from skilled therapeutic intervention in order to improve the following deficits and impairments:  Abnormal gait, Decreased activity tolerance, Decreased balance, Decreased endurance, Decreased mobility, Decreased strength  Visit Diagnosis: Other abnormalities of gait and mobility  Muscle weakness (generalized)     Problem List Patient Active Problem List   Diagnosis Date Noted  . Bacteremia due to Klebsiella pneumoniae 08/24/2019  . Cholangitis   . Choledocholithiasis with acute cholecystitis   . RUQ abdominal pain 08/18/2019  . History of CVA (cerebrovascular accident) 08/18/2019  . Abdominal pain, epigastric 08/17/2019  . Nausea and vomiting 08/17/2019  . Abnormal gallbladder ultrasound 08/17/2019  . Other constipation 08/17/2019  . Left sided lacunar infarction ([place]) 04/04/2019  . COPD (chronic obstructive pulmonary disease) ([place]) 04/06/2018  . GERD (gastroesophageal reflux disease) 04/06/2018  . Hyperlipidemia 07/21/2017  . Gouty arthritis 06/03/2016  . Osteoarthritis of knee 06/03/2016    Electa Sniff, PT, DPT, NCS 10/17/2019, 12:20 PM  [facility] [address]  [place], [place], [address] Phone: [phone]   Fax:  [phone]  Name: Joseph Orr MRN: [id number] Date of Birth: 07/19/1951

## 2019-10-18 ENCOUNTER — Telehealth: Payer: Self-pay

## 2019-10-18 NOTE — Telephone Encounter (Signed)
Copied from [place] [id number]. 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 Please advise if patient is able to pick up letter from office.

## 2019-10-19 NOTE — Telephone Encounter (Signed)
Letter done and sent thru [place]. Also printed so he can pick up at office is he prefers.

## 2019-10-21 ENCOUNTER — Ambulatory Visit: Payer: Medicare HMO

## 2019-10-24 ENCOUNTER — Ambulatory Visit: Payer: Medicare HMO

## 2019-10-26 ENCOUNTER — Ambulatory Visit: Payer: Medicare HMO

## 2019-11-14 ENCOUNTER — Other Ambulatory Visit: Payer: Self-pay

## 2019-11-14 NOTE — Patient Instructions (Signed)
There are no preventive care reminders to display for this patient.  Depression screen [facility] 2/9 09/08/2019 09/01/2019 07/22/2017  Decreased Interest 0 0 0  Down, Depressed, Hopeless 0 0 0  PHQ - 2 Score 0 0 0

## 2019-11-15 ENCOUNTER — Ambulatory Visit (INDEPENDENT_AMBULATORY_CARE_PROVIDER_SITE_OTHER): Payer: Medicare HMO

## 2019-11-15 ENCOUNTER — Ambulatory Visit: Payer: Medicare HMO

## 2019-11-15 DIAGNOSIS — Z23 Encounter for immunization: Secondary | ICD-10-CM | POA: Diagnosis not present

## 2019-11-15 NOTE — Progress Notes (Signed)
Per orders of Dr. Barron Alvine, injection of Shingrix given by Bertram Gala L Kayleen Alig in right deltoid. Patient tolerated injection well.

## 2019-11-22 ENCOUNTER — Encounter: Payer: Self-pay | Admitting: Family Medicine

## 2019-12-12 ENCOUNTER — Telehealth: Payer: Self-pay

## 2019-12-12 NOTE — Telephone Encounter (Signed)
PT called patient to check on how he was doing as had not returned to clinic since December. Left a message checking on status and asked that pt call out office by Wednesday to let PT know if he wanted more therapy at this time. If no response will proceed with discharge. Elmer Bales, PT, DPT, NCS

## 2019-12-15 NOTE — Therapy (Signed)
[facility] [address] [place], [place], [address] Phone: [phone]   Fax:  [phone]  Patient Details  Name: Joseph Orr MRN: [id number] Date of Birth: December 03, 1950 Referring Provider:  No ref. provider found  Encounter Date: 12/15/2019   PHYSICAL THERAPY DISCHARGE SUMMARY/ Non visit d/c  Visits from Start of Care: 5  Current functional level related to goals / functional outcomes: Pt last seen on 10/27/19. Was ambulating without AD with therapy focusing on higher level balance. I've attached the goals for progress that had been made as of last visit. Pt cancelled remaining visits due to wife undergoing cancer treatments. Spoke to pt on phone and he reports he has been doing well and working on exercises. May request new order for evaluation when wife done with treatments as does not have time currently. PT discharging at this time.   Remaining deficits: Was working on higher level balance at last visit.   Education / Equipment: HEP  Plan: Patient agrees to discharge.  Patient goals were not met. Patient is being discharged due to the patient's request.  ?????           PT Short Term Goals - 10/17/19 0805      PT SHORT TERM GOAL #1   Title  Pt will be independent with strengthening and balance HEP to continue gains on own.    Baseline  Pt reports that he has been doing his exercises.    Time  4    Period  Weeks    Status  Achieved    Target Date  10/19/19      PT SHORT TERM GOAL #2   Title  Pt will increase gait speed to >1.37ms for improved community ambulation.    Baseline  0.920m on 11/16/20204, 1.36m8mon 10/17/2019    Time  4    Period  Weeks    Status  Achieved           EmiElecta SniffT, DPT, NCS 12/15/2019, 9:10 AM  [place]one: [phone]:  [phone]

## 2019-12-17 ENCOUNTER — Ambulatory Visit: Payer: Medicare HMO | Attending: Internal Medicine

## 2019-12-17 DIAGNOSIS — Z23 Encounter for immunization: Secondary | ICD-10-CM | POA: Insufficient documentation

## 2019-12-17 NOTE — Progress Notes (Signed)
   Covid-19 Vaccination Clinic  Name:  Joseph Orr    MRN: [id number] DOB: October 22, 1951  12/17/2019  [name] was observed post Covid-19 immunization for 15 minutes without incidence. He was provided with Vaccine Information Sheet and instruction to access the V-Safe system.   [name] was instructed to call 911 with any severe reactions post vaccine: Marland Kitchen Difficulty breathing  . Swelling of your face and throat  . A fast heartbeat  . A bad rash all over your body  . Dizziness and weakness    Immunizations Administered    Name Date Dose VIS Date Route   Pfizer COVID-19 Vaccine 12/17/2019  9:51 AM 0.3 mL 10/14/2019 Intramuscular   Manufacturer: [employer], [employer]   Lot: [id number]   NDC: [address]-4

## 2020-01-09 ENCOUNTER — Ambulatory Visit: Payer: Medicare HMO | Attending: Internal Medicine

## 2020-01-09 DIAGNOSIS — Z23 Encounter for immunization: Secondary | ICD-10-CM | POA: Insufficient documentation

## 2020-01-09 NOTE — Progress Notes (Signed)
   Covid-19 Vaccination Clinic  Name:  Joseph Orr    MRN: [id number] DOB: [birth year]  01/09/2020  [name] was observed post Covid-19 immunization for 15 minutes without incident. He was provided with Vaccine Information Sheet and instruction to access the V-Safe system.   [name] was instructed to call 911 with any severe reactions post vaccine: Marland Kitchen Difficulty breathing  . Swelling of face and throat  . A fast heartbeat  . A bad rash all over body  . Dizziness and weakness   Immunizations Administered    Name Date Dose VIS Date Route   Pfizer COVID-19 Vaccine 01/09/2020  2:05 PM 0.3 mL 10/14/2019 Intramuscular   Manufacturer: [employer], [employer]   Lot: [id number]   NDC: [address]-6

## 2020-02-10 ENCOUNTER — Telehealth: Payer: Self-pay | Admitting: Family Medicine

## 2020-02-10 NOTE — Progress Notes (Signed)
  Chronic Care Management   Outreach Note  02/10/2020 Name: Jaylun Fleener MRN: [id number] DOB: 06/29/1951  Referred by: Overton Mam, DO Reason for referral : No chief complaint on file.   An unsuccessful telephone outreach was attempted today. The patient was referred to the pharmacist for assistance with care management and care coordination.   Follow Up Plan:   Lynnae January Upstream Scheduler

## 2020-02-16 ENCOUNTER — Telehealth: Payer: Self-pay | Admitting: Family Medicine

## 2020-02-16 NOTE — Progress Notes (Signed)
  Chronic Care Management   Outreach Note  02/16/2020 Name: Young Mulvey MRN: [id number] DOB: January 12, 1951  Referred by: Overton Mam, DO Reason for referral : No chief complaint on file.   An unsuccessful telephone outreach was attempted today. The patient was referred to the pharmacist for assistance with care management and care coordination.   Follow Up Plan:   Lynnae January Upstream Scheduler

## 2020-02-17 ENCOUNTER — Telehealth: Payer: Self-pay | Admitting: Family Medicine

## 2020-02-17 DIAGNOSIS — J449 Chronic obstructive pulmonary disease, unspecified: Secondary | ICD-10-CM

## 2020-02-17 DIAGNOSIS — E782 Mixed hyperlipidemia: Secondary | ICD-10-CM

## 2020-02-17 NOTE — Progress Notes (Signed)
  Chronic Care Management   Note  02/17/2020 Name: Skyelar Swigart MRN: [id number] DOB: 03-Mar-1951  Orton Capell Parenteau is a 69 y.o. year old male who is a primary care patient of Overton Mam, DO. I reached out to Troy Sine by phone today in response to a referral sent by Mr. Korver Graybeal [facility]'s PCP, Overton Mam, DO.   Mr. Mccadden was given information about Chronic Care Management services today including:  1. CCM service includes personalized support from designated clinical staff supervised by his physician, including individualized plan of care and coordination with other care providers 2. 24/7 contact phone numbers for assistance for urgent and routine care needs. 3. Service will only be billed when office clinical staff spend 20 minutes or more in a month to coordinate care. 4. Only one practitioner may furnish and bill the service in a calendar month. 5. The patient may stop CCM services at any time (effective at the end of the month) by phone call to the office staff.   Patient agreed to services and verbal consent obtained.   Follow up plan:   Lynnae January Upstream Scheduler

## 2020-02-21 NOTE — Patient Instructions (Addendum)
Visit Information It was great speaking with you today!  Please let me know if you have any questions about our visit.  Goals Addressed            This Visit's Progress   . Chronic Care Management       CARE PLAN ENTRY  Current Barriers:  . Chronic Disease Management support, education, and care coordination needs related to COPD, hyperlipidemia, gout, arthritis   Clinical Goal(s): Over the next 30 days, patient will:  . Work with the care management team to address educational, disease management, and care coordination needs  . Begin or continue self health monitoring activities as directed today  . Call provider office for new or worsened signs and symptoms  . Call care management team with questions or concerns . Maintain LDL (bad cholesterol) less than 70  . Prevent flares of COPD   Interventions:  . Evaluation of current treatment plans and patient's adherence to plan as established by provider . Assessed patient understanding of disease states . Assessed patient's education and care coordination needs . Provided disease specific education to patient  . Start pravastatin 10 mg daily  . Stop pantoprazole. If reflux or heartburn symptoms return, recommend Tums or Pepcid for as needed relief.   Patient Self Care Activities:  . Self administers medications as prescribed  Telephone follow up appointment with pharmacy team member scheduled for: 03/27/20 at 2:00 PM        Joseph Orr was given information about Chronic Care Management services today including:  1. CCM service includes personalized support from designated clinical staff supervised by his physician, including individualized plan of care and coordination with other care providers 2. 24/7 contact phone numbers for assistance for urgent and routine care needs. 3. Standard insurance, coinsurance, copays and deductibles apply for chronic care management only during months in which we provide at least 20 minutes of  these services. Most insurances cover these services at 100%, however patients may be responsible for any copay, coinsurance and/or deductible if applicable. This service may help you avoid the need for more expensive face-to-face services. 4. Only one practitioner may furnish and bill the service in a calendar month. 5. The patient may stop CCM services at any time (effective at the end of the month) by phone call to the office staff.  Patient agreed to services and verbal consent obtained.   The patient verbalized understanding of instructions provided today and agreed to receive a mailed copy of patient instruction and/or educational materials. Telephone follow up appointment with pharmacy team member scheduled for: 5/25  Joseph Orr [phone]

## 2020-02-21 NOTE — Addendum Note (Signed)
Addended by: Wyvonne Lenz on: 02/21/2020 10:45 AM   Modules accepted: Orders

## 2020-02-23 ENCOUNTER — Other Ambulatory Visit: Payer: Self-pay

## 2020-02-23 ENCOUNTER — Encounter: Payer: Self-pay | Admitting: Family Medicine

## 2020-02-23 ENCOUNTER — Telehealth (INDEPENDENT_AMBULATORY_CARE_PROVIDER_SITE_OTHER): Payer: Medicare HMO | Admitting: Family Medicine

## 2020-02-23 VITALS — Temp 98.3°F | Ht 69.0 in | Wt 182.0 lb

## 2020-02-23 DIAGNOSIS — L309 Dermatitis, unspecified: Secondary | ICD-10-CM

## 2020-02-23 MED ORDER — TRIAMCINOLONE ACETONIDE 0.1 % EX OINT: 1.0000 "application " | TOPICAL_OINTMENT | Freq: Two times a day (BID) | CUTANEOUS | 2 refills | Status: AC

## 2020-02-23 NOTE — Chronic Care Management (AMB) (Signed)
.   Chronic Care Management Pharmacy  Name: Joseph Orr  MRN: [id number] DOB: [birth year]  Chief Complaint/ HPI  Joseph Orr,  69 y.o. , male presents for their Initial CCM visit with the clinical pharmacist via telephone due to COVID-19 Pandemic.  PCP : Overton Mam, DO  Their chronic conditions include: COPD, HLD, Gouty arthritis, OA, CVA (old), GERD  Office Visits: 02/23/20: Video visit with Dr. Barron Alvine. Patient complains of 6 weeks of rash on left ankle/shin. Patient started on triamcinolone cream. 09/15/19: Patient presented to Dr. Barron Alvine for AWV. d/c Zofran not taking, transfer care incoming 10/29 d/c hospital (surgery), patient counseled on risk of EtOH use with clopidogrel.   Consult Visit:  08/18/19- 08/25/19:Patient admitted to [facility] for cholecystectomy. Course complicated by AKI and Klebsiella bacteremia. Patient received abx and discharged on Augmentin x 10 days.   Medications: Outpatient Encounter Medications as of 02/24/2020  Medication Sig  . acetaminophen (TYLENOL) 500 MG tablet Take 1,000 mg by mouth daily.  Marland Kitchen albuterol (VENTOLIN HFA) 108 (90 Base) MCG/ACT inhaler Inhale 2 puffs into the lungs every 4 (four) hours as needed for wheezing or shortness of breath.  . allopurinol (ZYLOPRIM) 100 MG tablet Take 1 tablet (100 mg total) by mouth daily.  . cetirizine (ZYRTEC) 10 MG tablet Take 10 mg by mouth every morning.   . clopidogrel (PLAVIX) 75 MG tablet Take 1 tablet (75 mg total) by mouth daily.  . Homeopathic Products ([facility] BRUISE) GEL Apply 1 application topically daily as needed (Bruising).  . pantoprazole (PROTONIX) 20 MG tablet Take 1 tablet (20 mg total) by mouth daily.  Marland Kitchen acetaminophen (TYLENOL) 325 MG tablet Take 2 tablets (650 mg total) by mouth every 6 (six) hours.  . cyanocobalamin 2000 MCG tablet Take 2,000 mcg by mouth every morning.  . triamcinolone ointment (KENALOG) 0.1 % Apply 1 application  topically 2 (two) times daily.   No facility-administered encounter medications on file as of 02/24/2020.     Current Diagnosis/Assessment:  Goals Addressed            This Visit's Progress   . Chronic Care Management       CARE PLAN ENTRY  Current Barriers:  . Chronic Disease Management support, education, and care coordination needs related to COPD, hyperlipidemia, gout, arthritis   Clinical Goal(s): Over the next 30 days, patient will:  . Work with the care management team to address educational, disease management, and care coordination needs  . Begin or continue self health monitoring activities as directed today  . Call provider office for new or worsened signs and symptoms  . Call care management team with questions or concerns . Maintain LDL (bad cholesterol) less than 70  . Prevent flares of COPD   Interventions:  . Evaluation of current treatment plans and patient's adherence to plan as established by provider . Assessed patient understanding of disease states . Assessed patient's education and care coordination needs . Provided disease specific education to patient  . Start pravastatin 10 mg daily  . Stop pantoprazole. If reflux or heartburn symptoms return, recommend Tums or Pepcid for as needed relief.   Patient Self Care Activities:  . Self administers medications as prescribed  Telephone follow up appointment with pharmacy team member scheduled for: 03/27/20 at 2:00 PM        COPD / Asthma / Tobacco   Last spirometry score: n/a  Gold Grade: n/a Current COPD Classification:  A (low sx, <2 exacerbations/yr)  Eosinophil count:   Lab Results  Component Value Date/Time   EOSPCT 1 08/25/2019 02:32 AM  %                               Eos (Absolute):  Lab Results  Component Value Date/Time   EOSABS 0.1 08/25/2019 02:32 AM    Tobacco Status:  Social History   Tobacco Use  Smoking Status Former Smoker  . Types: Cigarettes  . Quit date: 11/03/1998   . Years since quitting: 21.3  Smokeless Tobacco Never Used    Patient has failed these meds in past: n/a Patient is currently controlled on the following medications: albuterol MDI prn Using maintenance inhaler regularly? 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  Lipid Panel     Component Value Date/Time   CHOL 129 09/15/2019 1054   TRIG 83.0 09/15/2019 1054   HDL 46.60 09/15/2019 1054   CHOLHDL 3 09/15/2019 1054   VLDL 16.6 09/15/2019 1054   LDLCALC 66 09/15/2019 1054     The ASCVD Risk score (Goff DC Jr., et al., 2013) failed to calculate for the following reasons:   The patient has a prior MI or stroke diagnosis   Patient has failed these meds in past: atorvastatin, rosuvastatin (severe nausea/vomiting)  Patient is currently controlled on the following medications:   Clopidogrel 75 mg daily (AM)  Goal LDL <70  We discussed:  diet and exercise extensively Sedentary lifestyle. Walking approximately 1 mile daily. Reports some issues with balance since his stroke. Challenges with stairs (especially when going down them). Patient has a cane, but rarely uses it as he doesn't feel like it helps him. Discussed the anti-inflammatory and protective effects of statin medications, and patient was amenable to trying a different one and seeing if her tolerates.   Reports dark bruising on arms after sleeping on sides. He uses [employer]nicare to help manage his bruising, which he feels helps. Denies unusual bruising/bleeding.  Plan  Recommend pravastatin 10 mg daily due to antiinflammatory properties to prevent future strokes.   Osteoarthritis- Left Knee   Patient has failed these meds in past: n/a Patient is currently controlled on the following  medications:   Acetaminophen Extra Strength 1000 mg daily.   We discussed:  Pain well managed on APAP. Occasionally takes extra 500 mg if needed. Counseled patient to avoid NSAID use given kidney donor history.   Plan  Continue current medications   Misc/OTC   APAP Extra Strength 500 mg   Allopurinol 100 mg daily- last flare 15-20 years  Arnicare  Cetirizine 10 mg daily - provides modest relief Pantoprazole 20 mg daily- No reflux, but significant belching throughout    Triamcinolone 0.1% cream - has not picked up yet.    Recommend discontinuing pantoprazole given lack of benefit. Recommend Tums or Pepcid as alternative GERD agents that may help with belching symptoms.   Vaccines   Reviewed and discussed patient's vaccination history.    Immunization History  Administered Date(s) Administered  . Influenza, High Dose Seasonal PF 07/22/2017, 07/06/2019  . Influenza,inj,quad, With Preservative 07/04/2017  . Influenza-Unspecified 08/31/2018  . PFIZER SARS-COV-2 Vaccination 12/17/2019, 01/09/2020  . Pneumococcal Conjugate-13 07/22/2017  . Pneumococcal Polysaccharide-23 11/03/2012  . Pneumococcal-Unspecified 07/04/2017  .  Tdap 11/03/2012  . Zoster Recombinat (Shingrix) 09/15/2019, 11/15/2019    Medication Management   Pt uses [facility] for all medications. Patient endorses adherence to his medications, although he does not use a pillbox.   Plan  Continue current medication management strategy   Follow up: 1 month phone visit

## 2020-02-23 NOTE — Chronic Care Management (AMB) (Deleted)
.   Chronic Care Management Pharmacy  Name: Joseph Orr  MRN: [id number] DOB: 07-06-1951  Chief Complaint/ HPI  Joseph Orr,  69 y.o. , male presents for their Initial CCM visit with the clinical pharmacist via telephone due to COVID-19 Pandemic.  PCP : Ronnald Nian, DO  Their chronic conditions include: COPD, HLD, Gouty arthritis, OA, CVA (old), GERD  Office Visits:09/15/19 d/c Zofran not taking, transfer care incoming 10/29 d/c hospital (surgery), EtOH vs Plavix  Consult Visit: 2/11 d/c PT patient request, working on high level balance 08/18/19 [facility] - cholecystectomy   Vaccines   Reviewed and discussed patient's vaccination history.   3/8 [place] #2 2/13 Covid Pfizer #1 1/12 Shingrix #1 07/06/19 Flu   Immunization History  Administered Date(s) Administered  . Influenza, High Dose Seasonal PF 07/22/2017, 07/06/2019  . Influenza,inj,quad, With Preservative 07/04/2017  . Influenza-Unspecified 08/31/2018  . PFIZER SARS-COV-2 Vaccination 12/17/2019, 01/09/2020  . Pneumococcal Conjugate-13 07/22/2017  . Pneumococcal Polysaccharide-23 11/03/2012  . Pneumococcal-Unspecified 07/04/2017  . Tdap 11/03/2012  . Zoster Recombinat (Shingrix) 09/15/2019, 11/15/2019    Plan  Recommended patient receive Shingrix #2 vaccine in *** office/pharmacy.   Medications: Outpatient Encounter Medications as of 02/24/2020  Medication Sig  . acetaminophen (TYLENOL) 325 MG tablet Take 2 tablets (650 mg total) by mouth every 6 (six) hours.  Marland Kitchen albuterol (VENTOLIN HFA) 108 (90 Base) MCG/ACT inhaler Inhale 2 puffs into the lungs every 4 (four) hours as needed for wheezing or shortness of breath.  . allopurinol (ZYLOPRIM) 100 MG tablet Take 1 tablet (100 mg total) by mouth daily.  . cetirizine (ZYRTEC) 10 MG tablet Take 10 mg by mouth every morning.   . clopidogrel (PLAVIX) 75 MG tablet Take 1 tablet (75 mg total) by mouth daily.  . cyanocobalamin 2000 MCG  tablet Take 2,000 mcg by mouth every morning.  . pantoprazole (PROTONIX) 20 MG tablet Take 1 tablet (20 mg total) by mouth daily.   No facility-administered encounter medications on file as of 02/24/2020.     Current Diagnosis/Assessment:  Goals Addressed   None     COPD / Asthma / Tobacco   Last spirometry score: ***  Gold Grade: {CHL HP Upstream Pharm COPD Gold IHKVQ:[id number]} Current COPD Classification:  {CHL HP Upstream Pharm COPD Classification:[phone]}  Eosinophil count:   Lab Results  Component Value Date/Time   EOSPCT 1 08/25/2019 02:32 AM  %                               Eos (Absolute):  Lab Results  Component Value Date/Time   EOSABS 0.1 08/25/2019 02:32 AM    Tobacco Status:  Social History   Tobacco Use  Smoking Status Former Smoker  . Types: Cigarettes  . Quit date: 11/03/1998  . Years since quitting: 21.3  Smokeless Tobacco Never Used    Patient has failed these meds in past: *** Patient is currently controlled on the following medications: albuterol MDI prn Using maintenance inhaler regularly? No Frequency of rescue inhaler use:  {CHL HP Upstream Pharm Inhaler EPPI:[id number]}  We discussed:  {CHL HP Upstream Pharmacy discussion:[phone]}  Plan  Continue {CHL HP Upstream Pharmacy Plans:[phone]}  Hyperlipidemia   Lipid Panel     Component Value Date/Time   CHOL 129 09/15/2019 1054   TRIG 83.0 09/15/2019 1054   HDL 46.60 09/15/2019 1054   CHOLHDL 3 09/15/2019 1054   VLDL 16.6 09/15/2019 1054   LDLCALC  66 09/15/2019 1054     The ASCVD Risk score Denman George DC Jr., et al., 2013) failed to calculate for the following reasons:   The patient has a prior MI or stroke diagnosis   Patient has failed these meds in past: *** Patient is currently controlled on the following medications: None  We discussed:  {CHL HP Upstream Pharmacy discussion:[phone]}  Plan  Continue {CHL HP Upstream Pharmacy [phone]}  Misc/OTC    Patient has failed these meds in past: *** Patient is currently {CHL Controlled/[phone]} on the following medications: ***  We discussed:  ***  Plan  Continue {CHL HP Upstream Pharmacy RSWNI:[id number]}

## 2020-02-23 NOTE — Progress Notes (Signed)
Virtual Visit via Video Note  I connected with Joseph Orr on 02/23/20 at  2:00 PM EDT by a video enabled telemedicine application and verified that I am speaking with the correct person using two identifiers. Location patient: home Location provider: work  Persons participating in the virtual visit: patient, provider  I discussed the limitations of evaluation and management by telemedicine and the availability of in person appointments. The patient expressed understanding and agreed to proceed.  Chief Complaint  Patient presents with  . Rash    rash on left ankle//rash on the ankle and up the shin//showed up 6 weeks ago and got bigger//very itchy//pt tried neosporin no relief     HPI: Joseph Orr is a 69 y.o. male complains of rash on Lt ankle x 6 wks. Area is itchy. He notes new areas on legs and arms, maybe 8-10 in total. No painful. lesions are dry, scaly, most about he size of a quarter.  He has been using neosporin without much relief.  No fever, chills.    Past Medical History:  Diagnosis Date  . Arthritis   . CVA (cerebral vascular accident) ([place]) 03/03/2019  . GERD (gastroesophageal reflux disease)   . Gout   . TIA (transient ischemic attack) 02/2019    Past Surgical History:  Procedure Laterality Date  . CHOLECYSTECTOMY N/A 08/22/2019   Procedure: LAPAROSCOPIC CHOLECYSTECTOMY;  Surgeon: Clovis Riley, MD;  Location: [facility] OR;  Service: General;  Laterality: N/A;  . COLONOSCOPY     +8y  . ERCP N/A 08/21/2019   Procedure: ENDOSCOPIC RETROGRADE CHOLANGIOPANCREATOGRAPHY (ERCP);  Surgeon: Carol Ada, MD;  Location: [facility];  Service: Endoscopy;  Laterality: N/A;  . KIDNEY DONATION    . KNEE ARTHROSCOPY    . REMOVAL OF STONES  08/21/2019   Procedure: REMOVAL OF STONES;  Surgeon: Carol Ada, MD;  Location: [facility];  Service: Endoscopy;;  . [name]  08/21/2019   Procedure: [name];  Surgeon: Carol Ada, MD;  Location: [facility]  ENDOSCOPY;  Service: Endoscopy;;    Family History  Problem Relation Age of Onset  . Arthritis Mother   . Arthritis Father   . Pneumonia Father        Died age 63  . Stroke Brother     Social History   Tobacco Use  . Smoking status: Former Smoker    Types: Cigarettes    Quit date: 11/03/1998    Years since quitting: 21.3  . Smokeless tobacco: Never Used  Substance Use Topics  . Alcohol use: Yes    Alcohol/week: 3.0 - 4.0 standard drinks    Types: 3 - 4 Cans of beer per week    Comment: daily  . Drug use: Never     Current Outpatient Medications:  .  acetaminophen (TYLENOL) 325 MG tablet, Take 2 tablets (650 mg total) by mouth every 6 (six) hours., Disp: 30 tablet, Rfl: 1 .  albuterol (VENTOLIN HFA) 108 (90 Base) MCG/ACT inhaler, Inhale 2 puffs into the lungs every 4 (four) hours as needed for wheezing or shortness of breath., Disp: 18 g, Rfl: 1 .  allopurinol (ZYLOPRIM) 100 MG tablet, Take 1 tablet (100 mg total) by mouth daily., Disp: 90 tablet, Rfl: 3 .  cetirizine (ZYRTEC) 10 MG tablet, Take 10 mg by mouth every morning. , Disp: , Rfl:  .  clopidogrel (PLAVIX) 75 MG tablet, Take 1 tablet (75 mg total) by mouth daily., Disp: 90 tablet, Rfl: 3 .  pantoprazole (PROTONIX) 20 MG tablet, Take  1 tablet (20 mg total) by mouth daily., Disp: 90 tablet, Rfl: 3 .  cyanocobalamin 2000 MCG tablet, Take 2,000 mcg by mouth every morning., Disp: , Rfl:   Allergies  Allergen Reactions  . Oxycodone-Acetaminophen Nausea And Vomiting  . Atorvastatin Nausea And Vomiting  . Rosuvastatin Nausea And Vomiting      ROS: See pertinent positives and negatives per HPI.   EXAM:  VITALS per patient if applicable: Temp 98.3 F (36.8 C) (Tympanic) Comment: pt reported  Ht 5\' 9"  (1.753 m)   Wt 182 lb (82.6 kg) Comment: pt reported  BMI 26.88 kg/m    GENERAL: alert, oriented, appears well and in no acute distress  NECK: normal movements of the head and neck  LUNGS: on inspection no signs  of respiratory distress, breathing rate appears normal, no obvious gross SOB, gasping or wheezing, no conversational dyspnea  CV: no obvious cyanosis  SKIN: discrete dry, scaly lesions on B/L anterior LE  PSYCH/NEURO: pleasant and cooperative, speech and thought processing grossly intact   ASSESSMENT AND PLAN: 1. Acute dermatitis Rx: - triamcinolone ointment (KENALOG) 0.1 %; Apply 1 application topically 2 (two) times daily.  Dispense: 30 g; Refill: 2 - f/u if symptoms worsen or do not improve in 2 wks  Discussed plan and reviewed medications with patient, including risks, benefits, and potential side effects. Pt expressed understand. All questions answered.    I discussed the assessment and treatment plan with the patient. The patient was provided an opportunity to ask questions and all were answered. The patient agreed with the plan and demonstrated an understanding of the instructions.   The patient was advised to call back or seek an in-person evaluation if the symptoms worsen or if the condition fails to improve as anticipated.   , DO

## 2020-02-24 ENCOUNTER — Ambulatory Visit: Payer: Medicare HMO

## 2020-02-24 ENCOUNTER — Other Ambulatory Visit: Payer: Self-pay | Admitting: Family Medicine

## 2020-02-24 DIAGNOSIS — J449 Chronic obstructive pulmonary disease, unspecified: Secondary | ICD-10-CM

## 2020-02-24 DIAGNOSIS — Z8673 Personal history of transient ischemic attack (TIA), and cerebral infarction without residual deficits: Secondary | ICD-10-CM

## 2020-02-24 MED ORDER — PRAVASTATIN SODIUM 10 MG PO TABS: 10.0000 mg | ORAL_TABLET | Freq: Every day | ORAL | 1 refills | Status: AC

## 2020-03-08 ENCOUNTER — Encounter: Payer: Self-pay | Admitting: Family Medicine

## 2020-03-08 ENCOUNTER — Telehealth (INDEPENDENT_AMBULATORY_CARE_PROVIDER_SITE_OTHER): Payer: Medicare HMO | Admitting: Family Medicine

## 2020-03-08 VITALS — BP 126/64 | Temp 97.9°F | Ht 69.0 in | Wt 181.4 lb

## 2020-03-08 DIAGNOSIS — R7301 Impaired fasting glucose: Secondary | ICD-10-CM | POA: Diagnosis not present

## 2020-03-08 DIAGNOSIS — K2100 Gastro-esophageal reflux disease with esophagitis, without bleeding: Secondary | ICD-10-CM | POA: Diagnosis not present

## 2020-03-08 DIAGNOSIS — E782 Mixed hyperlipidemia: Secondary | ICD-10-CM | POA: Diagnosis not present

## 2020-03-08 NOTE — Progress Notes (Signed)
Virtual Visit via Video Note  I connected with Joseph Orr on 03/08/20 at  4:00 PM EDT by a video enabled telemedicine application and verified that I am speaking with the correct person using two identifiers. Location patient: home Location provider: work Persons participating in the virtual visit: patient, provider  I discussed the limitations of evaluation and management by telemedicine and the availability of in person appointments. The patient expressed understanding and agreed to proceed.  Chief Complaint  Patient presents with  . Gastroesophageal Reflux    Pt c/o of belching, vomiting and cramping.  Pt said that he started Pravaststin 10 days ago and stopped taking Pantoprazole 40mg ,  pt explains that is when the belching started and worsened to vomiting and the stomach cramping.  Pt sais that Monday he started back taking the Pantoprazole and everything had calmed down.      HPI: Joseph Orr is a 69 y.o. male  Pt stopped pantoprazole 40mg  10 days ago and after that time he noted increased belching, nausea, vomiting, cramping over a week or so. No fever, chills. He restarted the pantoprazole PM with dramatic improvement in symptoms in 3-4 hrs. He has walked 1 mile per day for the past 2 days.   He is taking pravastatin 10mg  daily - no side effects and nothing like when he tired previous statins.   His daughter asked pt about last time he had fasting glucose done since he has mentioned to her that a lot of foods taste sweet to him. Glucose (appears to be non-fasting) has been done in past 5-6 mo.   Past Medical History:  Diagnosis Date  . Arthritis   . CVA (cerebral vascular accident) ([place]) 03/03/2019  . GERD (gastroesophageal reflux disease)   . Gout   . TIA (transient ischemic attack) 02/2019    Past Surgical History:  Procedure Laterality Date  . CHOLECYSTECTOMY N/A 08/22/2019   Procedure: LAPAROSCOPIC CHOLECYSTECTOMY;  Surgeon: Clovis Riley, MD;   Location[facility];  Service: General;  Laterality: N/A;  . COLONOSCOPY     +8y  . ERCP N/A 08/21/2019   Procedure: ENDOSCOPIC RETROGRADE CHOLANGIOPANCREATOGRAPHY (ERCP);  Surgeon: Carol Ada, MD;  Location: [facility];  Service: Endoscopy;  Laterality: N/A;  . KIDNEY DONATION    . KNEE ARTHROSCOPY    . REMOVAL OF STONES  08/21/2019   Procedure: REMOVAL OF STONES;  Surgeon: Carol Ada, MD;  Location: [facility];  Service: Endoscopy;;  . [name]  08/21/2019   Procedure: [name];  Surgeon: Carol Ada, MD;  Location: [facility] ENDOSCOPY;  Service: Endoscopy;;    Family History  Problem Relation Age of Onset  . Arthritis Mother   . Arthritis Father   . Pneumonia Father        Died age 39  . Stroke Brother     Social History   Tobacco Use  . Smoking status: Former Smoker    Types: Cigarettes    Quit date: 11/03/1998    Years since quitting: 21.3  . Smokeless tobacco: Never Used  Substance Use Topics  . Alcohol use: Yes    Alcohol/week: 3.0 - 4.0 standard drinks    Types: 3 - 4 Cans of beer per week    Comment: daily  . Drug use: Never     Current Outpatient Medications:  .  acetaminophen (TYLENOL) 325 MG tablet, Take 2 tablets (650 mg total) by mouth every 6 (six) hours., Disp: 30 tablet, Rfl: 1 .  acetaminophen (TYLENOL) 500 MG tablet,  Take 1,000 mg by mouth daily., Disp: , Rfl:  .  albuterol (VENTOLIN HFA) 108 (90 Base) MCG/ACT inhaler, Inhale 2 puffs into the lungs every 4 (four) hours as needed for wheezing or shortness of breath., Disp: 18 g, Rfl: 1 .  allopurinol (ZYLOPRIM) 100 MG tablet, Take 1 tablet (100 mg total) by mouth daily., Disp: 90 tablet, Rfl: 3 .  cetirizine (ZYRTEC) 10 MG tablet, Take 10 mg by mouth every morning. , Disp: , Rfl:  .  clopidogrel (PLAVIX) 75 MG tablet, Take 1 tablet (75 mg total) by mouth daily., Disp: 90 tablet, Rfl: 3 .  Homeopathic Products (ARNICARE BRUISE) GEL, Apply 1 application topically daily as needed (Bruising).,  Disp: , Rfl:  .  pravastatin (PRAVACHOL) 10 MG tablet, Take 1 tablet (10 mg total) by mouth daily. (Patient taking differently: Take 10 mg by mouth daily. Pt taking one tablet every other day.), Disp: 90 tablet, Rfl: 1 .  triamcinolone ointment (KENALOG) 0.1 %, Apply 1 application topically 2 (two) times daily., Disp: 30 g, Rfl: 2  Allergies  Allergen Reactions  . Oxycodone-Acetaminophen Nausea And Vomiting  . Atorvastatin Nausea And Vomiting  . Rosuvastatin Nausea And Vomiting      ROS: See pertinent positives and negatives per HPI.   EXAM:  VITALS per patient if applicable: BP 126/64 (BP Location: Left Arm, Patient Position: Sitting, Cuff Size: Normal)   Temp 97.9 F (36.6 C) (Temporal)   Ht 5\' 9"  (1.753 m)   Wt 181 lb 6.4 oz (82.3 kg)   BMI 26.79 kg/m    GENERAL: alert, oriented, appears well and in no acute distress  HEENT: atraumatic, conjunctiva clear, no obvious abnormalities on inspection of external nose and ears  NECK: normal movements of the head and neck  LUNGS: on inspection no signs of respiratory distress, breathing rate appears normal, no obvious gross SOB, gasping or wheezing, no conversational dyspnea  CV: no obvious cyanosis  PSYCH/NEURO: pleasant and cooperative, no obvious depression or anxiety, speech and thought processing grossly intact   ASSESSMENT AND PLAN:  1. Elevated fasting glucose - Hemoglobin A1c; Future  2. Gastroesophageal reflux disease with esophagitis without hemorrhage - tired to stop his pantoprazole but then had significant increase in GERD symptoms over 1 week time frame. Pt restarted pantoprazole and symptoms improved in 3-4 hrs - cont pantoprazole  3. Mixed hyperlipidemia - tolerating pravastatin 10mg  daily    I discussed the assessment and treatment plan with the patient. The patient was provided an opportunity to ask questions and all were answered. The patient agreed with the plan and demonstrated an understanding of  the instructions.   The patient was advised to call back or seek an in-person evaluation if the symptoms worsen or if the condition fails to improve as anticipated.   , DO

## 2020-03-12 DIAGNOSIS — H0102A Squamous blepharitis right eye, upper and lower eyelids: Secondary | ICD-10-CM | POA: Diagnosis not present

## 2020-03-12 DIAGNOSIS — H0102B Squamous blepharitis left eye, upper and lower eyelids: Secondary | ICD-10-CM | POA: Diagnosis not present

## 2020-03-12 DIAGNOSIS — Z961 Presence of intraocular lens: Secondary | ICD-10-CM | POA: Diagnosis not present

## 2020-03-15 ENCOUNTER — Other Ambulatory Visit: Payer: Self-pay

## 2020-03-16 ENCOUNTER — Other Ambulatory Visit (INDEPENDENT_AMBULATORY_CARE_PROVIDER_SITE_OTHER): Payer: Medicare HMO

## 2020-03-16 DIAGNOSIS — R7301 Impaired fasting glucose: Secondary | ICD-10-CM | POA: Diagnosis not present

## 2020-03-16 LAB — HEMOGLOBIN A1C: Hgb A1c MFr Bld: 4.6 % (ref 4.6–6.5)

## 2020-03-27 ENCOUNTER — Ambulatory Visit: Payer: Medicare HMO

## 2020-03-27 DIAGNOSIS — E782 Mixed hyperlipidemia: Secondary | ICD-10-CM

## 2020-03-27 DIAGNOSIS — J449 Chronic obstructive pulmonary disease, unspecified: Secondary | ICD-10-CM

## 2020-03-27 NOTE — Patient Instructions (Signed)
Visit Information  Goals Addressed            This Visit's Progress   . Chronic Care Management       CARE PLAN ENTRY  Current Barriers:  . Chronic Disease Management support, education, and care coordination needs related to COPD, hyperlipidemia, gout, arthritis   Clinical Goal(s): Over the next 30 days, patient will:  . Work with the care management team to address educational, disease management, and care coordination needs  . Begin or continue self health monitoring activities as directed today  . Call provider office for new or worsened signs and symptoms  . Call care management team with questions or concerns . Maintain LDL (bad cholesterol) less than 70  . Prevent flares of COPD   Interventions:  . Evaluation of current treatment plans and patient's adherence to plan as established by provider . Assessed patient understanding of disease states . Assessed patient's education and care coordination needs . Provided disease specific education to patient  . Start pravastatin 10 mg daily   Patient Self Care Activities:  . Self administers medications as prescribed       The patient verbalized understanding of instructions provided today and declined a print copy of patient instruction materials.   Joseph Orr Clinical Pharmacist Corinda Gubler at [facility]  [phone]

## 2020-03-27 NOTE — Chronic Care Management (AMB) (Signed)
.   Chronic Care Management Pharmacy  Name: Joseph Orr  MRN: [id number] DOB: [birth year]  Chief Complaint/ HPI  Joseph Orr,  69 y.o. , male presents for their Initial CCM visit with the clinical pharmacist via telephone due to COVID-19 Pandemic.  PCP : Ronnald Nian, DO  Their chronic conditions include: COPD, HLD, Gouty arthritis, OA, CVA, GERD  Office Visits: 02/23/20: Video visit with Dr. Bryan Lemma. Patient complains of 6 weeks of rash on left ankle/shin. Patient started on triamcinolone cream. 09/15/19: Patient presented to Dr. Bryan Lemma for AWV. d/c Zofran not taking, transfer care incoming 10/29 d/c hospital (surgery), patient counseled on risk of EtOH use with clopidogrel.   Consult Visit:  08/18/19- 08/25/19:Patient admitted to [facility] for cholecystectomy. Course complicated by AKI and Klebsiella bacteremia. Patient received abx and discharged on Augmentin x 10 days.   Medications: Outpatient Encounter Medications as of 03/27/2020  Medication Sig  . acetaminophen (TYLENOL) 500 MG tablet Take 1,000 mg by mouth daily.  Marland Kitchen albuterol (VENTOLIN HFA) 108 (90 Base) MCG/ACT inhaler Inhale 2 puffs into the lungs every 4 (four) hours as needed for wheezing or shortness of breath.  . allopurinol (ZYLOPRIM) 100 MG tablet Take 1 tablet (100 mg total) by mouth daily.  . cetirizine (ZYRTEC) 10 MG tablet Take 10 mg by mouth every morning.   . clopidogrel (PLAVIX) 75 MG tablet Take 1 tablet (75 mg total) by mouth daily.  . Homeopathic Products (ARNICARE BRUISE) GEL Apply 1 application topically daily as needed (Bruising).  . pantoprazole (PROTONIX) 20 MG tablet Take 20 mg by mouth daily.  . pravastatin (PRAVACHOL) 10 MG tablet Take 1 tablet (10 mg total) by mouth daily. (Patient taking differently: Take 10 mg by mouth daily. Pt taking one tablet daily for two days, then skipping one day)  . acetaminophen (TYLENOL) 325 MG tablet Take 2 tablets (650  mg total) by mouth every 6 (six) hours. (Patient not taking: Reported on 03/27/2020)  . triamcinolone ointment (KENALOG) 0.1 % Apply 1 application topically 2 (two) times daily. (Patient not taking: Reported on 03/27/2020)   No facility-administered encounter medications on file as of 03/27/2020.     Current Diagnosis/Assessment:  Goals Addressed            This Visit's Progress   . Chronic Care Management       CARE PLAN ENTRY  Current Barriers:  . Chronic Disease Management support, education, and care coordination needs related to COPD, hyperlipidemia, gout, arthritis   Clinical Goal(s): Over the next 30 days, patient will:  . Work with the care management team to address educational, disease management, and care coordination needs  . Begin or continue self health monitoring activities as directed today  . Call provider office for new or worsened signs and symptoms  . Call care management team with questions or concerns . Maintain LDL (bad cholesterol) less than 70  . Prevent flares of COPD   Interventions:  . Evaluation of current treatment plans and patient's adherence to plan as established by provider . Assessed patient understanding of disease states . Assessed patient's education and care coordination needs . Provided disease specific education to patient  . Start pravastatin 10 mg daily   Patient Self Care Activities:  . Self administers medications as prescribed       COPD / Asthma / Tobacco   Last spirometry score: n/a  Gold Grade: n/a Current COPD Classification:  A (low sx, <2 exacerbations/yr)  Eosinophil count:  Lab Results  Component Value Date/Time   EOSPCT 1 08/25/2019 02:32 AM  %                               Eos (Absolute):  Lab Results  Component Value Date/Time   EOSABS 0.1 08/25/2019 02:32 AM    Tobacco Status:  Social History   Tobacco Use  Smoking Status Former Smoker  . Types: Cigarettes  . Quit date: 11/03/1998  . Years since  quitting: 21.4  Smokeless Tobacco Never Used    Patient has failed these meds in past: n/a Patient is currently controlled on the following medications: albuterol MDI prn Using maintenance inhaler regularly? 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  Lipid Panel     Component Value Date/Time   CHOL 129 09/15/2019 1054   TRIG 83.0 09/15/2019 1054   HDL 46.60 09/15/2019 1054   CHOLHDL 3 09/15/2019 1054   VLDL 16.6 09/15/2019 1054   LDLCALC 66 09/15/2019 1054     The ASCVD Risk score (Goff DC Jr., et al., 2013) failed to calculate for the following reasons:   The patient has a prior MI or stroke diagnosis   Patient has failed these meds in past: atorvastatin, rosuvastatin (severe nausea/vomiting)  Patient is currently controlled on the following medications:   Clopidogrel 75 mg daily (AM)   Pravastatin 10 mg daily (takes 2 days on, 1 day off) Goal LDL <70  We discussed:  diet and exercise extensively.  Patient has been tolerating pravastatin well slowly increasing the dose and has be taking 10 mg for 2 days then skipping on day for 1 week.   Walking approximately 1.5 miles daily at the [facility]. Reports some issues with balance since his stroke. Challenges with stairs (especially when going down them). Patient has a cane, but rarely uses it as he doesn't feel like it helps him. Discussed the anti-inflammatory and protective effects of statin medications, and patient was amenable to trying a different one and seeing if her tolerates.   Reports dark bruising on arms after sleeping on sides. He uses Arnicare to help manage his bruising, which he feels helps. Denies unusual bruising/bleeding.  Low appetite.   Plan  Recommend increasing    Osteoarthritis- Left Knee   Patient has failed these meds in past: n/a Patient is currently controlled on the following medications:   Acetaminophen Extra Strength 1000 mg daily.   We discussed:  Pain well managed on APAP. Occasionally takes extra 500 mg if needed. Counseled patient to avoid NSAID use given kidney donor history.   Plan  Continue current medications   Misc/OTC   APAP Extra Strength 500 mg   Allopurinol 100 mg daily- last flare 15-20 years  Arnicare  Cetirizine 10 mg daily - provides modest relief Pantoprazole 20 mg daily   Triamcinolone 0.1% cream - has not picked up yet.   Did not tolerate stopping pantoprazole and had to resume due to significant reflux symptoms that started  ~9-10 days later.  Continue current medications   Vaccines   Reviewed and discussed patient's vaccination history.    Immunization History  Administered Date(s) Administered  . Influenza, High Dose Seasonal PF 07/22/2017, 07/06/2019  . Influenza,inj,quad, With Preservative  07/04/2017  . Influenza-Unspecified 08/31/2018  . PFIZER SARS-COV-2 Vaccination 12/17/2019, 01/09/2020  . Pneumococcal Conjugate-13 07/22/2017  . Pneumococcal Polysaccharide-23 11/03/2012  . Pneumococcal-Unspecified 07/04/2017  . Tdap 11/03/2012  . Zoster Recombinat (Shingrix) 09/15/2019, 11/15/2019    Medication Management   [facility] medications. Patient endorses adherence to his medications, although he does not use a pillbox.   Plan  Continue current medication management strategy   Follow up: Will not plan follow-up at this time as patient will be moving in next 1-2 months.   Joseph Orr Clinical Pharmacist Corinda Gubler at [facility]  [phone]

## 2020-04-09 ENCOUNTER — Other Ambulatory Visit: Payer: Self-pay

## 2020-04-09 ENCOUNTER — Encounter: Payer: Self-pay | Admitting: Family

## 2020-04-09 ENCOUNTER — Ambulatory Visit (INDEPENDENT_AMBULATORY_CARE_PROVIDER_SITE_OTHER): Payer: Medicare HMO | Admitting: Family

## 2020-04-09 VITALS — BP 120/70 | HR 95 | Temp 96.9°F | Ht 69.0 in | Wt 187.2 lb

## 2020-04-09 DIAGNOSIS — H6983 Other specified disorders of Eustachian tube, bilateral: Secondary | ICD-10-CM

## 2020-04-09 DIAGNOSIS — H6122 Impacted cerumen, left ear: Secondary | ICD-10-CM

## 2020-04-09 MED ORDER — FLUTICASONE PROPIONATE 50 MCG/ACT NA SUSP: 2.0000 | Freq: Every day | NASAL | 1 refills | Status: AC

## 2020-04-09 NOTE — Progress Notes (Signed)
Acute Office Visit  Subjective:    Patient ID: Joseph Orr, male    DOB: 07/20/1951, 69 y.o.   MRN: [id number]  Chief Complaint  Patient presents with  . clogged ears    Pt c/o of his ears feels like he just got out the pool.  More in the rt ear, x 3 weeks.    HPI Patient is in today with c/o feeling like his ears are stopped up over the past 3 weeks off and on. He reports an occasional sore throat but no cough or congestion. He is normally a Furniture conservator/restorer" and is no more off-balanced than normal. Has a history of a TIA in the past so his daughter's wanted him to be seen  Past Medical History:  Diagnosis Date  . Arthritis   . CVA (cerebral vascular accident) ([place]) 03/03/2019  . GERD (gastroesophageal reflux disease)   . Gout   . TIA (transient ischemic attack) 02/2019    Past Surgical History:  Procedure Laterality Date  . CHOLECYSTECTOMY N/A 08/22/2019   Procedure: LAPAROSCOPIC CHOLECYSTECTOMY;  Surgeon: Clovis Riley, MD;  Location: [facility];  Service: General;  Laterality: N/A;  . COLONOSCOPY     +8y  . ERCP N/A 08/21/2019   Procedure: ENDOSCOPIC RETROGRADE CHOLANGIOPANCREATOGRAPHY (ERCP);  Surgeon: Carol Ada, MD;  Location: [facility];  Service: Endoscopy;  Laterality: N/A;  . KIDNEY DONATION    . KNEE ARTHROSCOPY    . REMOVAL OF STONES  08/21/2019   Procedure: REMOVAL OF STONES;  Surgeon: Carol Ada, MD;  Location: [facility];  Service: Endoscopy;;  . [name]  08/21/2019   Procedure: [name];  Surgeon: Carol Ada, MD;  Location: [facility] ENDOSCOPY;  Service: Endoscopy;;    Family History  Problem Relation Age of Onset  . Arthritis Mother   . Arthritis Father   . Pneumonia Father        Died age 62  . Stroke Brother     Social History   Socioeconomic History  . Marital status: Married    Spouse name: Gay Filler  . Number of children: 3  . Years of education: 2 years   . Highest education level: Some college, no degree    Occupational History  . Not on file  Tobacco Use  . Smoking status: Former Smoker    Types: Cigarettes    Quit date: 11/03/1998    Years since quitting: 21.4  . Smokeless tobacco: Never Used  Substance and Sexual Activity  . Alcohol use: Yes    Alcohol/week: 3.0 - 4.0 standard drinks    Types: 3 - 4 Cans of beer per week    Comment: daily  . Drug use: Never  . Sexual activity: Yes  Other Topics Concern  . Not on file  Social History Narrative   Moved here to be with her daughter who works at [employer]. 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 Originally from [place].    Social Determinants of Health   Financial Resource Strain: Low Risk   . Difficulty of Paying Living Expenses: Not hard at all  Food Insecurity: No Food Insecurity  . Worried About Charity fundraiser in the Last Year: Never true  . Ran Out of Food in the Last Year: Never true  Transportation Needs: No Transportation Needs  . Lack of Transportation (Medical): No  . Lack of Transportation (Non-Medical): No  Physical Activity: Insufficiently Active  . Days of Exercise per Week: 3 days  . Minutes of Exercise per Session: 20  min  Stress: No Stress Concern Present  . Feeling of Stress : Not at all  Social Connections: Somewhat Isolated  . Frequency of Communication with Friends and Family: More than three times a week  . Frequency of Social Gatherings with Friends and Family: More than three times a week  . Attends Religious Services: Never  . Active Member of Clubs or Organizations: No  . Attends Archivist Meetings: Never  . Marital Status: Married  Human resources officer Violence: Not At Risk  . Fear of Current or Ex-Partner: No  . Emotionally Abused: No  . Physically Abused: No  . Sexually Abused: No    Outpatient Medications Prior to Visit  Medication Sig Dispense Refill  . acetaminophen (TYLENOL) 325 MG tablet Take 2 tablets (650 mg total) by mouth every 6 (six) hours. 30 tablet 1  .  acetaminophen (TYLENOL) 500 MG tablet Take 1,000 mg by mouth daily.    Marland Kitchen albuterol (VENTOLIN HFA) 108 (90 Base) MCG/ACT inhaler Inhale 2 puffs into the lungs every 4 (four) hours as needed for wheezing or shortness of breath. 18 g 1  . allopurinol (ZYLOPRIM) 100 MG tablet Take 1 tablet (100 mg total) by mouth daily. 90 tablet 3  . cetirizine (ZYRTEC) 10 MG tablet Take 10 mg by mouth every morning.     . clopidogrel (PLAVIX) 75 MG tablet Take 1 tablet (75 mg total) by mouth daily. 90 tablet 3  . Homeopathic Products (ARNICARE BRUISE) GEL Apply 1 application topically daily as needed (Bruising).    . pantoprazole (PROTONIX) 20 MG tablet Take 20 mg by mouth daily.    . pravastatin (PRAVACHOL) 10 MG tablet Take 1 tablet (10 mg total) by mouth daily. (Patient taking differently: Take 10 mg by mouth daily. Pt taking one tablet daily for two days, then skipping one day) 90 tablet 1  . triamcinolone ointment (KENALOG) 0.1 % Apply 1 application topically 2 (two) times daily. 30 g 2   No facility-administered medications prior to visit.    Allergies  Allergen Reactions  . Oxycodone-Acetaminophen Nausea And Vomiting  . Atorvastatin Nausea And Vomiting  . Rosuvastatin Nausea And Vomiting    Review of Systems     Objective:    Physical Exam HENT:     Right Ear: Hearing, tympanic membrane and ear canal normal.     Left Ear: Hearing, tympanic membrane and ear canal normal.     Ears:     Comments: Left Ear Lavage: Informed consent was obtained and peroxide gel was inserted into the ears bilaterally using the lavage kit the ears were lavaged until clean.Inspection with a cerumen spoon removed residual wax. Patient tolerated the procedure well.    Mouth/Throat:     Mouth: Mucous membranes are moist.  Cardiovascular:     Pulses: Normal pulses.  Pulmonary:     Effort: Pulmonary effort is normal.     Breath sounds: Normal breath sounds.  Musculoskeletal:     Cervical back: Normal range of motion  and neck supple.  Skin:    General: Skin is warm and dry.  Neurological:     General: No focal deficit present.     Mental Status: He is oriented to person, place, and time. Mental status is at baseline.     Gait: Gait abnormal.  Psychiatric:        Mood and Affect: Mood normal.     BP 120/70 (BP Location: Right Arm, Patient Position: Sitting, Cuff Size: Normal)   Pulse  95   Temp (!) 96.9 F (36.1 C) (Temporal)   Ht _0  (1.753 m)   Wt 187 lb 3.2 oz (84.9 kg)   SpO2 98%   BMI 27.64 kg/m  Wt Readings from Last 3 Encounters:  04/09/20 187 lb 3.2 oz (84.9 kg)  03/08/20 181 lb 6.4 oz (82.3 kg)  02/23/20 182 lb (82.6 kg)    Health Maintenance Due  Topic Date Due  . COLONOSCOPY  03/22/2020    There are no preventive care reminders to display for this patient.   Lab Results  Component Value Date   TSH 1.81 03/07/2019   Lab Results  Component Value Date   WBC 14.7 (H) 08/25/2019   HGB 9.7 (L) 08/25/2019   HCT 28.0 (L) 08/25/2019   MCV 99.6 08/25/2019   PLT 170 08/25/2019   Lab Results  Component Value Date   NA 138 08/25/2019   K 4.0 08/25/2019   CO2 24 08/25/2019   GLUCOSE 105 (H) 09/15/2019   BUN 8 08/25/2019   CREATININE 0.89 08/25/2019   BILITOT 1.6 (H) 08/25/2019   ALKPHOS 126 08/25/2019   AST 50 (H) 08/25/2019   ALT 55 (H) 08/25/2019   PROT 4.9 (L) 08/25/2019   ALBUMIN 2.1 (L) 08/25/2019   CALCIUM 7.7 (L) 08/25/2019   ANIONGAP 7 08/25/2019   GFR 88.47 04/04/2019   Lab Results  Component Value Date   CHOL 129 09/15/2019   Lab Results  Component Value Date   HDL 46.60 09/15/2019   Lab Results  Component Value Date   LDLCALC 66 09/15/2019   Lab Results  Component Value Date   TRIG 83.0 09/15/2019   Lab Results  Component Value Date   CHOLHDL 3 09/15/2019   Lab Results  Component Value Date   HGBA1C 4.6 03/16/2020       Assessment & Plan:   Problem List Items Addressed This Visit    None    Visit Diagnoses    Eustachian tube  dysfunction, bilateral    -  Primary       Meds ordered this encounter  Medications  . fluticasone (FLONASE) 50 MCG/ACT nasal spray    Sig: Place 2 sprays into both nostrils daily.    Dispense:  16 g    Refill:  1   Call the office with any questions or concerns. Recheck as scheduled and as needed sooner.  Kennyth Arnold, FNP

## 2020-04-09 NOTE — Patient Instructions (Signed)
Eustachian Tube Dysfunction ° °Eustachian tube dysfunction refers to a condition in which a blockage develops in the narrow passage that connects the middle ear to the back of the nose (eustachian tube). The eustachian tube regulates air pressure in the middle ear by letting air move between the ear and nose. It also helps to drain fluid from the middle ear space. °Eustachian tube dysfunction can affect one or both ears. When the eustachian tube does not function properly, air pressure, fluid, or both can build up in the middle ear. °What are the causes? °This condition occurs when the eustachian tube becomes blocked or cannot open normally. Common causes of this condition include: °· Ear infections. °· Colds and other infections that affect the nose, mouth, and throat (upper respiratory tract). °· Allergies. °· Irritation from cigarette smoke. °· Irritation from stomach acid coming up into the esophagus (gastroesophageal reflux). The esophagus is the tube that carries food from the mouth to the stomach. °· Sudden changes in air pressure, such as from descending in an airplane or scuba diving. °· Abnormal growths in the nose or throat, such as: °? Growths that line the nose (nasal polyps). °? Abnormal growth of cells (tumors). °? Enlarged tissue at the back of the throat (adenoids). °What increases the risk? °You are more likely to develop this condition if: °· You smoke. °· You are overweight. °· You are a child who has: °? Certain birth defects of the mouth, such as cleft palate. °? Large tonsils or adenoids. °What are the signs or symptoms? °Common symptoms of this condition include: °· A feeling of fullness in the ear. °· Ear pain. °· Clicking or popping noises in the ear. °· Ringing in the ear. °· Hearing loss. °· Loss of balance. °· Dizziness. °Symptoms may get worse when the air pressure around you changes, such as when you travel to an area of high elevation, fly on an airplane, or go scuba diving. °How is  this diagnosed? °This condition may be diagnosed based on: °· Your symptoms. °· A physical exam of your ears, nose, and throat. °· Tests, such as those that measure: °? The movement of your eardrum (tympanogram). °? Your hearing (audiometry). °How is this treated? °Treatment depends on the cause and severity of your condition. °· In mild cases, you may relieve your symptoms by moving air into your ears. This is called "popping the ears." °· In more severe cases, or if you have symptoms of fluid in your ears, treatment may include: °? Medicines to relieve congestion (decongestants). °? Medicines that treat allergies (antihistamines). °? Nasal sprays or ear drops that contain medicines that reduce swelling (steroids). °? A procedure to drain the fluid in your eardrum (myringotomy). In this procedure, a small tube is placed in the eardrum to: °§ Drain the fluid. °§ Restore the air in the middle ear space. °? A procedure to insert a balloon device through the nose to inflate the opening of the eustachian tube (balloon dilation). °Follow these instructions at home: °Lifestyle °· Do not do any of the following until your health care provider approves: °? Travel to high altitudes. °? Fly in airplanes. °? Work in a pressurized cabin or room. °? Scuba dive. °· Do not use any products that contain nicotine or tobacco, such as cigarettes and e-cigarettes. If you need help quitting, ask your health care provider. °· Keep your ears dry. Wear fitted earplugs during showering and bathing. Dry your ears completely after. °General instructions °· Take over-the-counter   and prescription medicines only as told by your health care provider. °· Use techniques to help pop your ears as recommended by your health care provider. These may include: °? Chewing gum. °? Yawning. °? Frequent, forceful swallowing. °? Closing your mouth, holding your nose closed, and gently blowing as if you are trying to blow air out of your nose. °· Keep all  follow-up visits as told by your health care provider. This is important. °Contact a health care provider if: °· Your symptoms do not go away after treatment. °· Your symptoms come back after treatment. °· You are unable to pop your ears. °· You have: °? A fever. °? Pain in your ear. °? Pain in your head or neck. °? Fluid draining from your ear. °· Your hearing suddenly changes. °· You become very dizzy. °· You lose your balance. °Summary °· Eustachian tube dysfunction refers to a condition in which a blockage develops in the eustachian tube. °· It can be caused by ear infections, allergies, inhaled irritants, or abnormal growths in the nose or throat. °· Symptoms include ear pain, hearing loss, or ringing in the ears. °· Mild cases are treated with maneuvers to unblock the ears, such as yawning or ear popping. °· Severe cases are treated with medicines. Surgery may also be done (rare). °This information is not intended to replace advice given to you by your health care provider. Make sure you discuss any questions you have with your health care provider. °Document Revised: 02/09/2018 Document Reviewed: 02/09/2018 °Elsevier Patient Education © 2020 [employer] ° °

## 2020-05-22 DIAGNOSIS — K769 Liver disease, unspecified: Secondary | ICD-10-CM | POA: Diagnosis not present

## 2020-05-22 DIAGNOSIS — R109 Unspecified abdominal pain: Secondary | ICD-10-CM | POA: Diagnosis not present

## 2020-05-22 DIAGNOSIS — D6959 Other secondary thrombocytopenia: Secondary | ICD-10-CM | POA: Diagnosis not present

## 2020-05-22 DIAGNOSIS — R7401 Elevation of levels of liver transaminase levels: Secondary | ICD-10-CM | POA: Diagnosis not present

## 2020-05-22 DIAGNOSIS — E876 Hypokalemia: Secondary | ICD-10-CM | POA: Diagnosis not present

## 2020-05-22 DIAGNOSIS — R932 Abnormal findings on diagnostic imaging of liver and biliary tract: Secondary | ICD-10-CM | POA: Diagnosis not present

## 2020-05-22 DIAGNOSIS — R69 Illness, unspecified: Secondary | ICD-10-CM | POA: Diagnosis not present

## 2020-05-22 DIAGNOSIS — R197 Diarrhea, unspecified: Secondary | ICD-10-CM | POA: Diagnosis not present

## 2020-05-22 DIAGNOSIS — J811 Chronic pulmonary edema: Secondary | ICD-10-CM | POA: Diagnosis not present

## 2020-05-22 DIAGNOSIS — K766 Portal hypertension: Secondary | ICD-10-CM | POA: Diagnosis not present

## 2020-05-22 DIAGNOSIS — R188 Other ascites: Secondary | ICD-10-CM | POA: Diagnosis not present

## 2020-05-22 DIAGNOSIS — R531 Weakness: Secondary | ICD-10-CM | POA: Diagnosis not present

## 2020-05-22 DIAGNOSIS — E440 Moderate protein-calorie malnutrition: Secondary | ICD-10-CM | POA: Diagnosis not present

## 2020-05-22 DIAGNOSIS — R112 Nausea with vomiting, unspecified: Secondary | ICD-10-CM | POA: Diagnosis not present

## 2020-05-22 DIAGNOSIS — I493 Ventricular premature depolarization: Secondary | ICD-10-CM | POA: Diagnosis not present

## 2020-05-22 DIAGNOSIS — Z20822 Contact with and (suspected) exposure to covid-19: Secondary | ICD-10-CM | POA: Diagnosis not present

## 2020-05-22 DIAGNOSIS — E871 Hypo-osmolality and hyponatremia: Secondary | ICD-10-CM | POA: Diagnosis not present

## 2020-05-22 DIAGNOSIS — R634 Abnormal weight loss: Secondary | ICD-10-CM | POA: Diagnosis not present

## 2020-05-22 DIAGNOSIS — F1010 Alcohol abuse, uncomplicated: Secondary | ICD-10-CM | POA: Diagnosis not present

## 2020-05-22 DIAGNOSIS — N179 Acute kidney failure, unspecified: Secondary | ICD-10-CM | POA: Diagnosis not present

## 2020-08-24 ENCOUNTER — Telehealth: Payer: Self-pay | Admitting: Family Medicine

## 2020-08-24 NOTE — Telephone Encounter (Signed)
Left message for patient to schedule Annual Wellness Visit.  Please schedule with Nurse Health Advisor Martha Stanley, RN at [facility]  °

## 2021-03-28 ENCOUNTER — Telehealth: Payer: Self-pay | Admitting: Family Medicine

## 2021-03-28 NOTE — Telephone Encounter (Signed)
Left message for patient to call back and schedule Medicare Annual Wellness Visit (AWV) in office.   If not able to come in office, please offer to do virtually or by telephone.   Last AWV:07/22/2017   Please schedule at anytime with Nurse Health Advisor.

## 2021-04-08 NOTE — Telephone Encounter (Signed)
Joseph Orr note.

## 2021-04-08 NOTE — Telephone Encounter (Signed)
Pt returned call. He moved to [place] about 1 year ago and has new providers.

## 2021-04-08 NOTE — Telephone Encounter (Signed)
Denisa-please disregard-it's a [facility] patient.

## 2021-06-09 IMAGING — CT CT ANGIO NECK
1 of 11 series · 13 of 46 positions shown, 17 images · IV contrast (OMNI)
Comparison: MRI 03/15/2019

CLINICAL DATA: Focal neurological deficit

EXAM:
CT ANGIOGRAPHY HEAD AND NECK
TECHNIQUE: Multidetector CT imaging of the head and neck was performed using
the standard protocol during bolus administration of intravenous
contrast. Multiplanar CT image reconstructions and MIPs were
obtained to evaluate the vascular anatomy. Carotid stenosis
measurements (when applicable) are obtained utilizing NASCET
criteria, using the distal internal carotid diameter as the
denominator.
CONTRAST:  100mL OMNIPAQUE IOHEXOL 350 MG/ML SOLN

[Series 18: thin · axial · 0.41mm/px · z∈[-308,+21]mm · 13 of 727 slices shown, 17 images]
[im 35/727  soft-tissue]
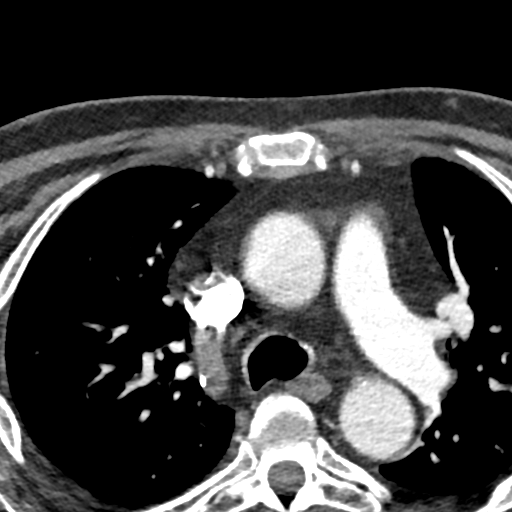
[im 35/727  bone]
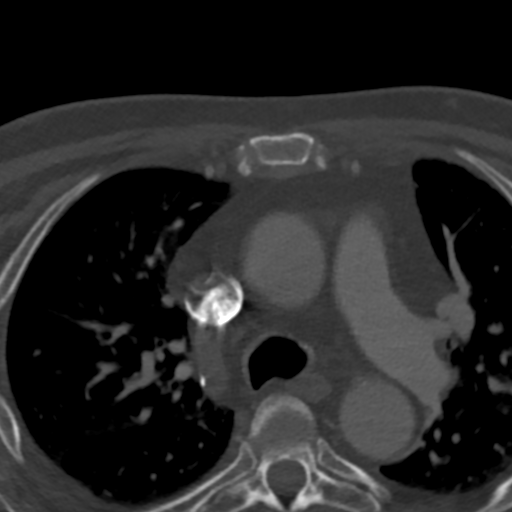
[im 104/727  soft-tissue]
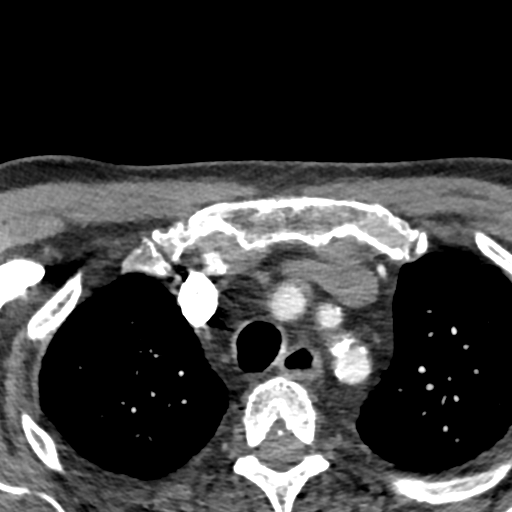
[im 173/727  soft-tissue]
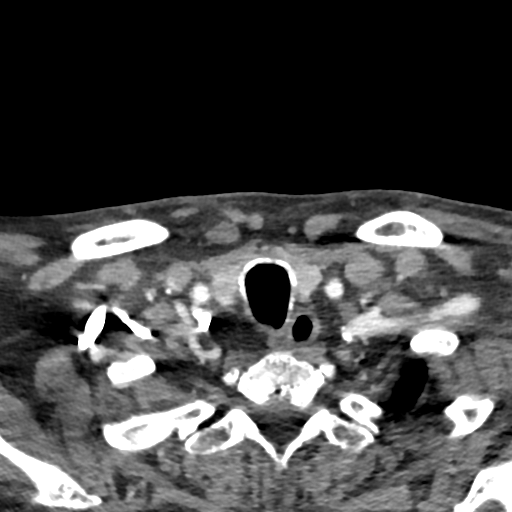
[im 243/727  soft-tissue]
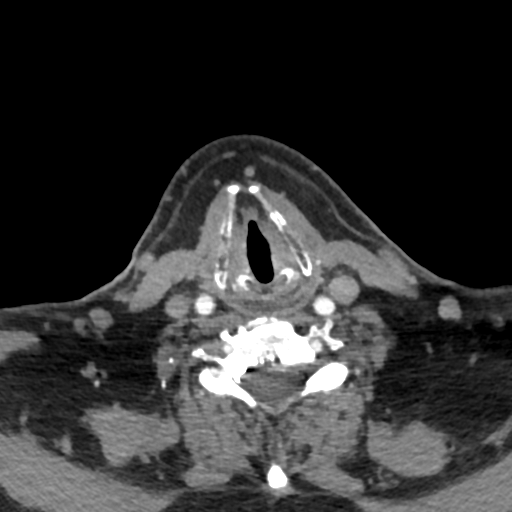
[im 312/727  soft-tissue]
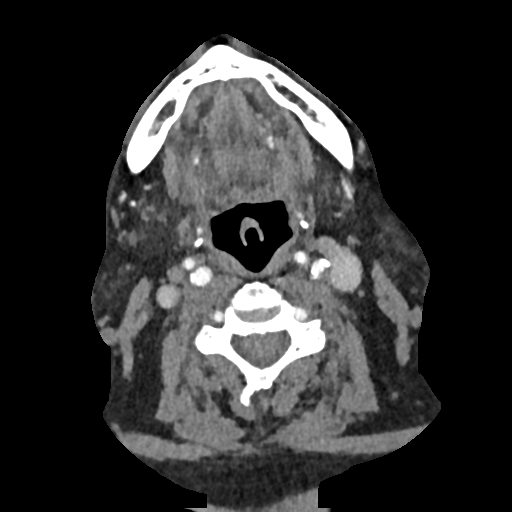
[im 381/727  soft-tissue]
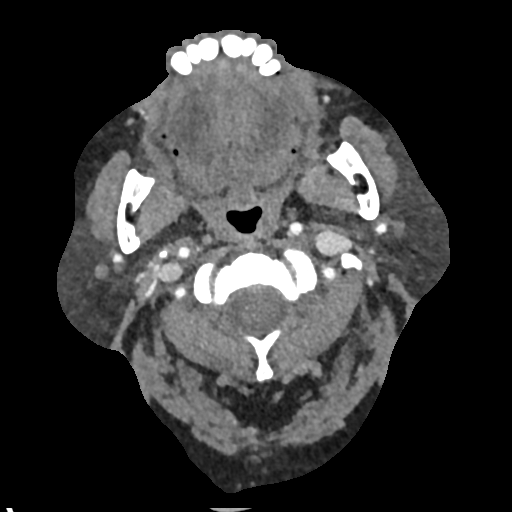
[im 415/727  soft-tissue]
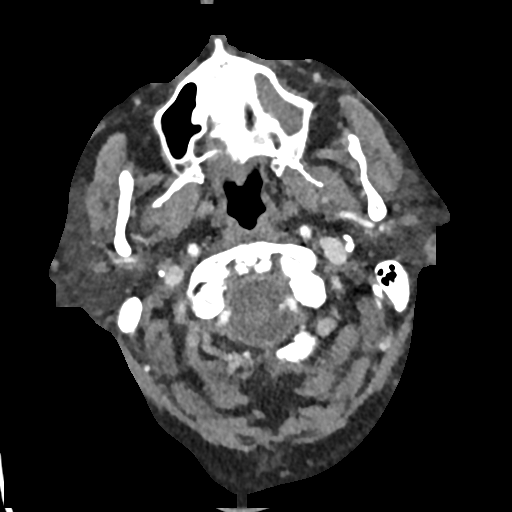
[im 485/727  soft-tissue]
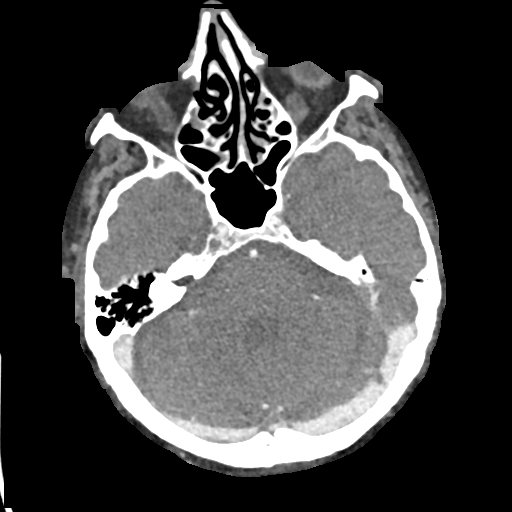
[im 554/727  soft-tissue]
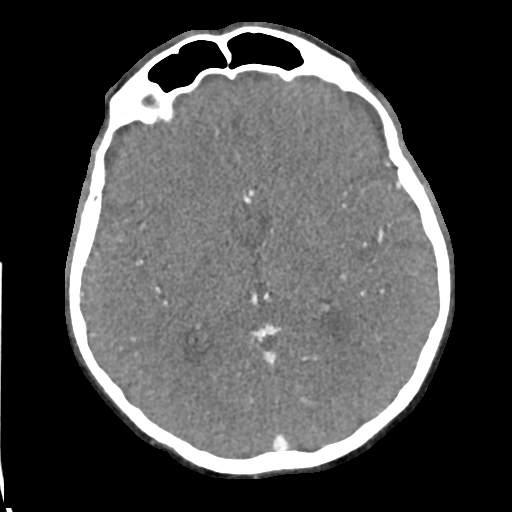
[im 554/727  bone]
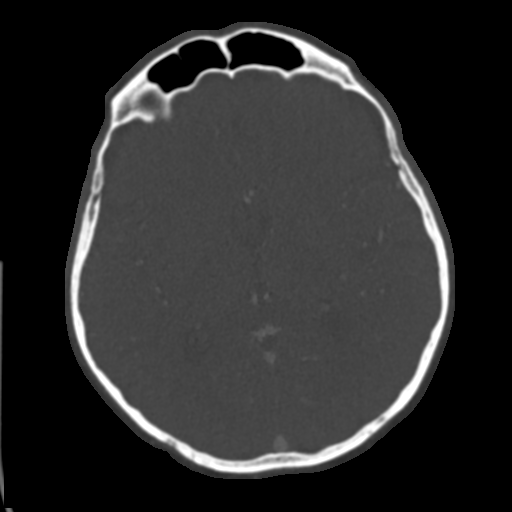
[im 588/727  lung]
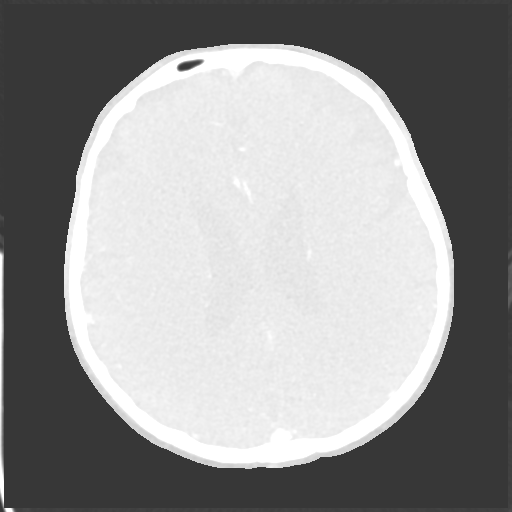
[im 623/727  soft-tissue]
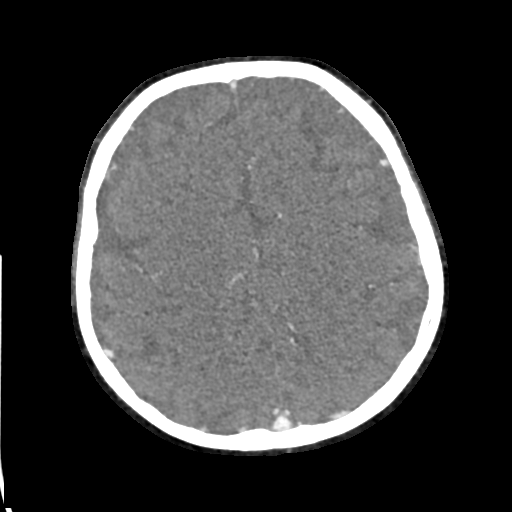
[im 623/727  lung]
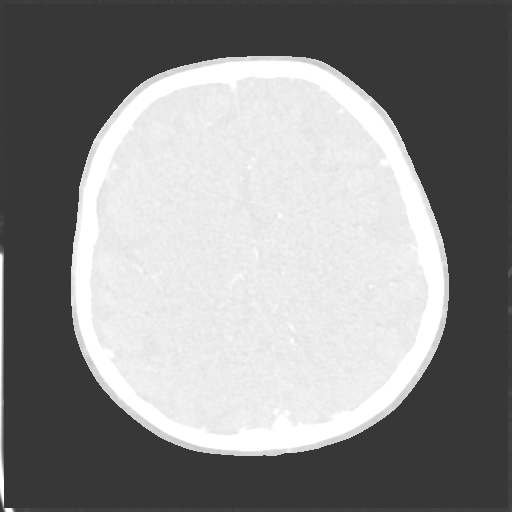
[im 657/727  lung]
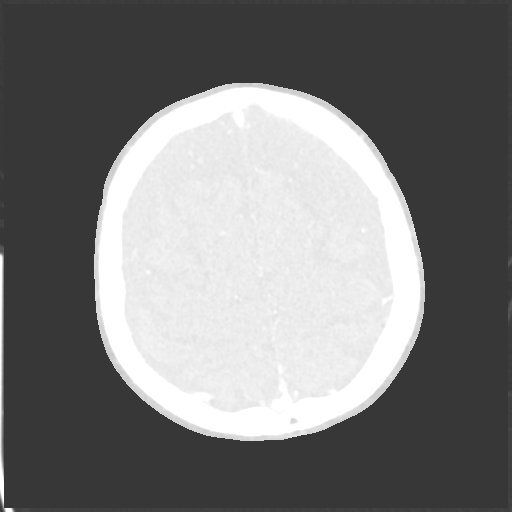
[im 692/727  soft-tissue]
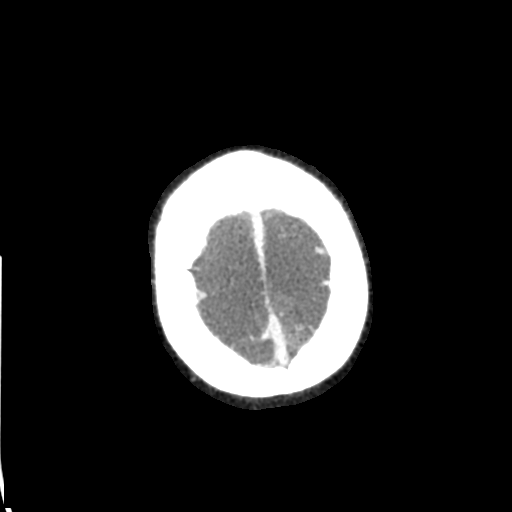
[im 692/727  lung]
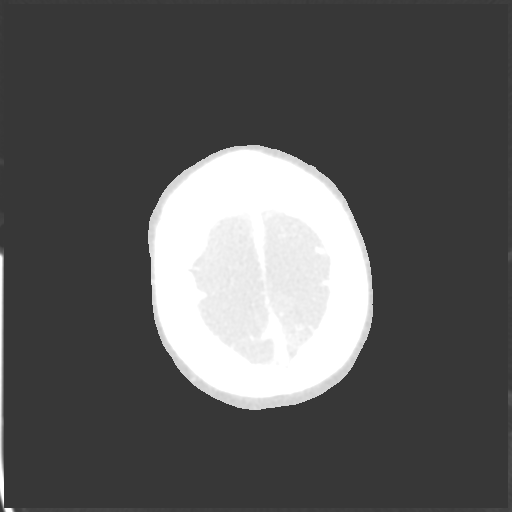

[13 of 46 positions shown; findings below may reference images not displayed]

FINDINGS: CT HEAD FINDINGS

Brain: Generalized atrophy. No focal abnormality seen affecting the
brainstem or cerebellum. Cerebral hemispheres show an old infarction
of the left thalamus no other focal finding. No mass lesion,
hemorrhage, hydrocephalus or extra-axial collection.

Vascular: There is atherosclerotic calcification of the major
vessels at the base of the brain.

Skull: Negative

Sinuses: Left maxillary sinus inflammatory changes. Other sinuses
clear.

Orbits: Negative

Review of the MIP images confirms the above findings

CTA NECK FINDINGS

Aortic arch: Aortic atherosclerosis. No aneurysm or dissection.
Branching pattern is normal without origin stenosis.

Right carotid system: Common carotid artery is widely patent to the
bifurcation region. There is calcified plaque at the carotid
bifurcation and ICA bulb. Minimal diameter is 4.5 mm, the same as
the more distal cervical ICA, therefore no stenosis.

Left carotid system: Common carotid artery widely patent to the
bifurcation region. Calcified plaque at the carotid bifurcation and
ICA bulb. Minimal diameter in the ICA bulb measures 2.7 mm. Compared
to a more distal cervical ICA diameter 4.4 mm, this indicates a 40%
stenosis.

Vertebral arteries: Calcified plaque at both vertebral artery
origins. No stenosis on the right. 30% stenosis on the left. Beyond
that, both vertebral arteries are widely patent through the cervical
region to foramen magnum.

Skeleton: Mild cervical spondylosis.

Other neck: No mass or lymphadenopathy.

Upper chest: Negative

Review of the MIP images confirms the above findings

CTA HEAD FINDINGS

Anterior circulation: Both internal carotid arteries are patent
through the skull base and siphon regions. There is atherosclerotic
calcification throughout the carotid siphons with stenosis estimated
at 50% on each side. The anterior and middle cerebral vessels are
patent without proximal stenosis, aneurysm or vascular malformation.
No large or medium vessel occlusion identified.

Posterior circulation: Both vertebral arteries are patent through
the foramen magnum to the basilar. Atherosclerotic irregularity of
the V4 segments but no stenosis greater than 30%. No basilar
stenosis. Superior cerebellar and posterior cerebral arteries show
flow on both sides.

Venous sinuses: Patent and normal.

Anatomic variants: None significant.

Review of the MIP images confirms the above findings
IMPRESSION: 1. Aortic atherosclerosis. No aneurysm or dissection.
2. Atherosclerotic disease at both carotid bifurcations but without
measurable stenosis on the right. 40% stenosis of the ICA bulb on
the left.
3. Atherosclerotic disease throughout the carotid siphons with
stenosis estimated at 50% on each side.
4. No intracranial large or medium vessel occlusion or correctable
proximal stenosis.
5. 30% stenosis of the left vertebral artery origin.

Aortic Atherosclerosis ([id number]).

## 2021-06-11 IMAGING — RF DG ERCP WO/W SPHINCTEROTOMY
1 series · 3 of 3 positions shown · non-contrast
Comparison: MRCP 08/19/2019

CLINICAL DATA: Choledocholithiasis.

EXAM:
ERCP
TECHNIQUE: Multiple spot images obtained with the fluoroscopic device and
submitted for interpretation post-procedure.
FLUOROSCOPY TIME:  Fluoroscopy Time:  2 minutes and 40 seconds
Number of Acquired Spot Images: 3

[Series 1: unknown protocol · 0.20mm/px · 3 of 3 slices shown]
[im 1/3]
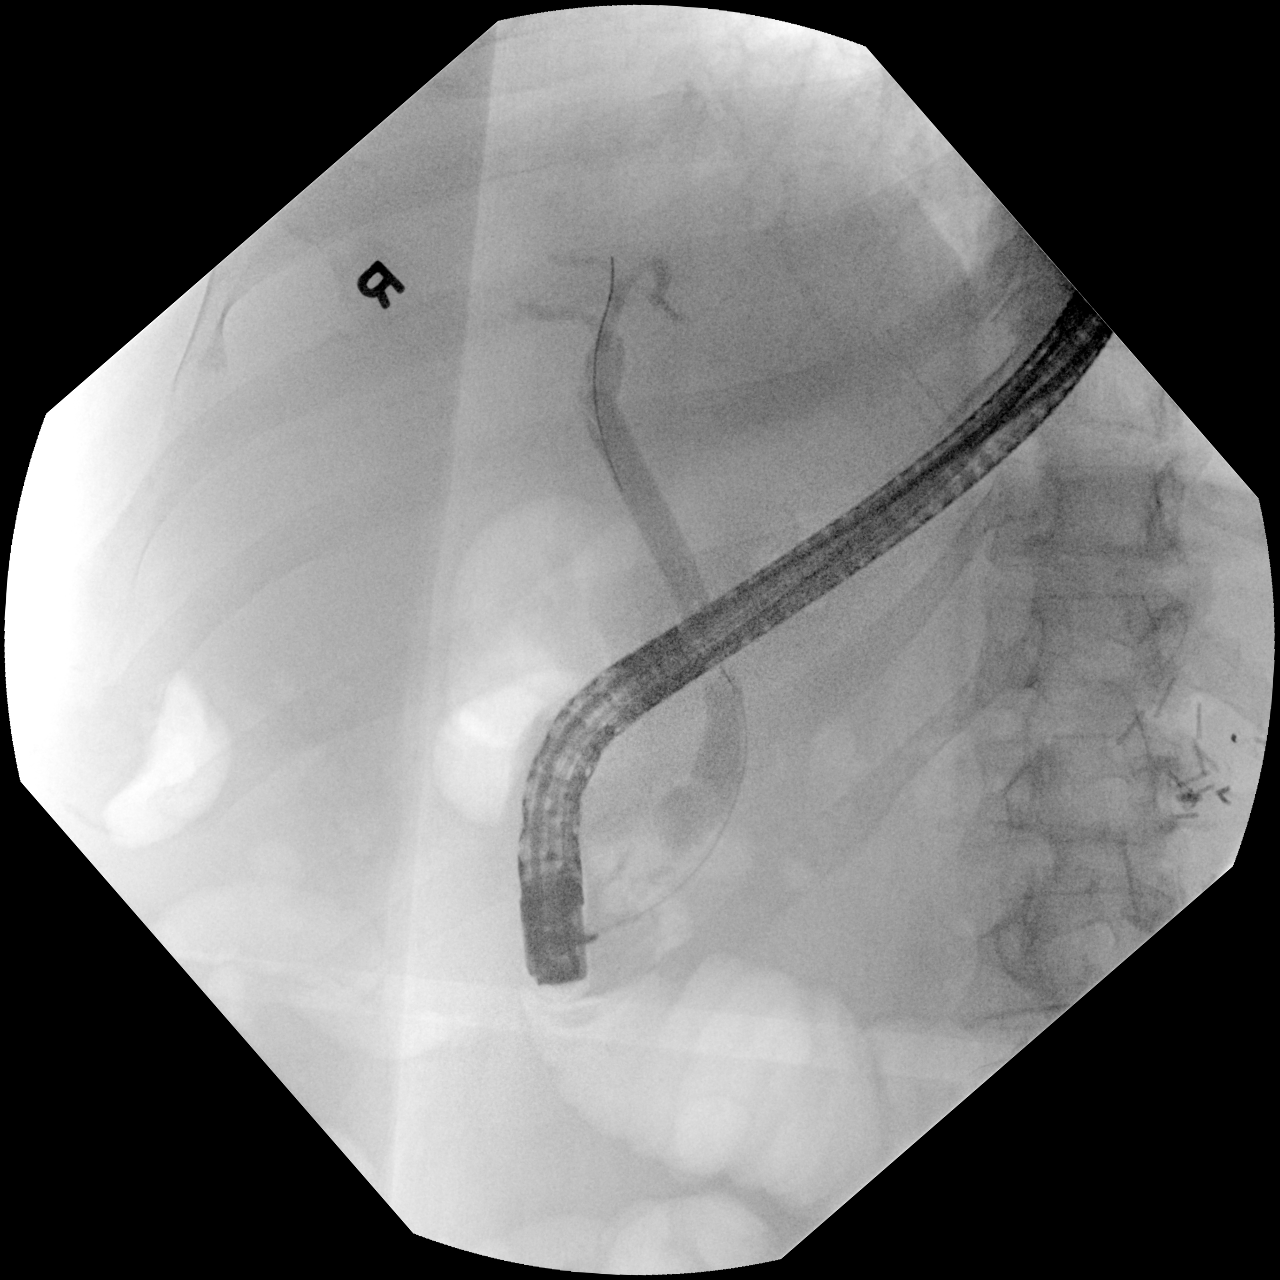
[im 2/3]
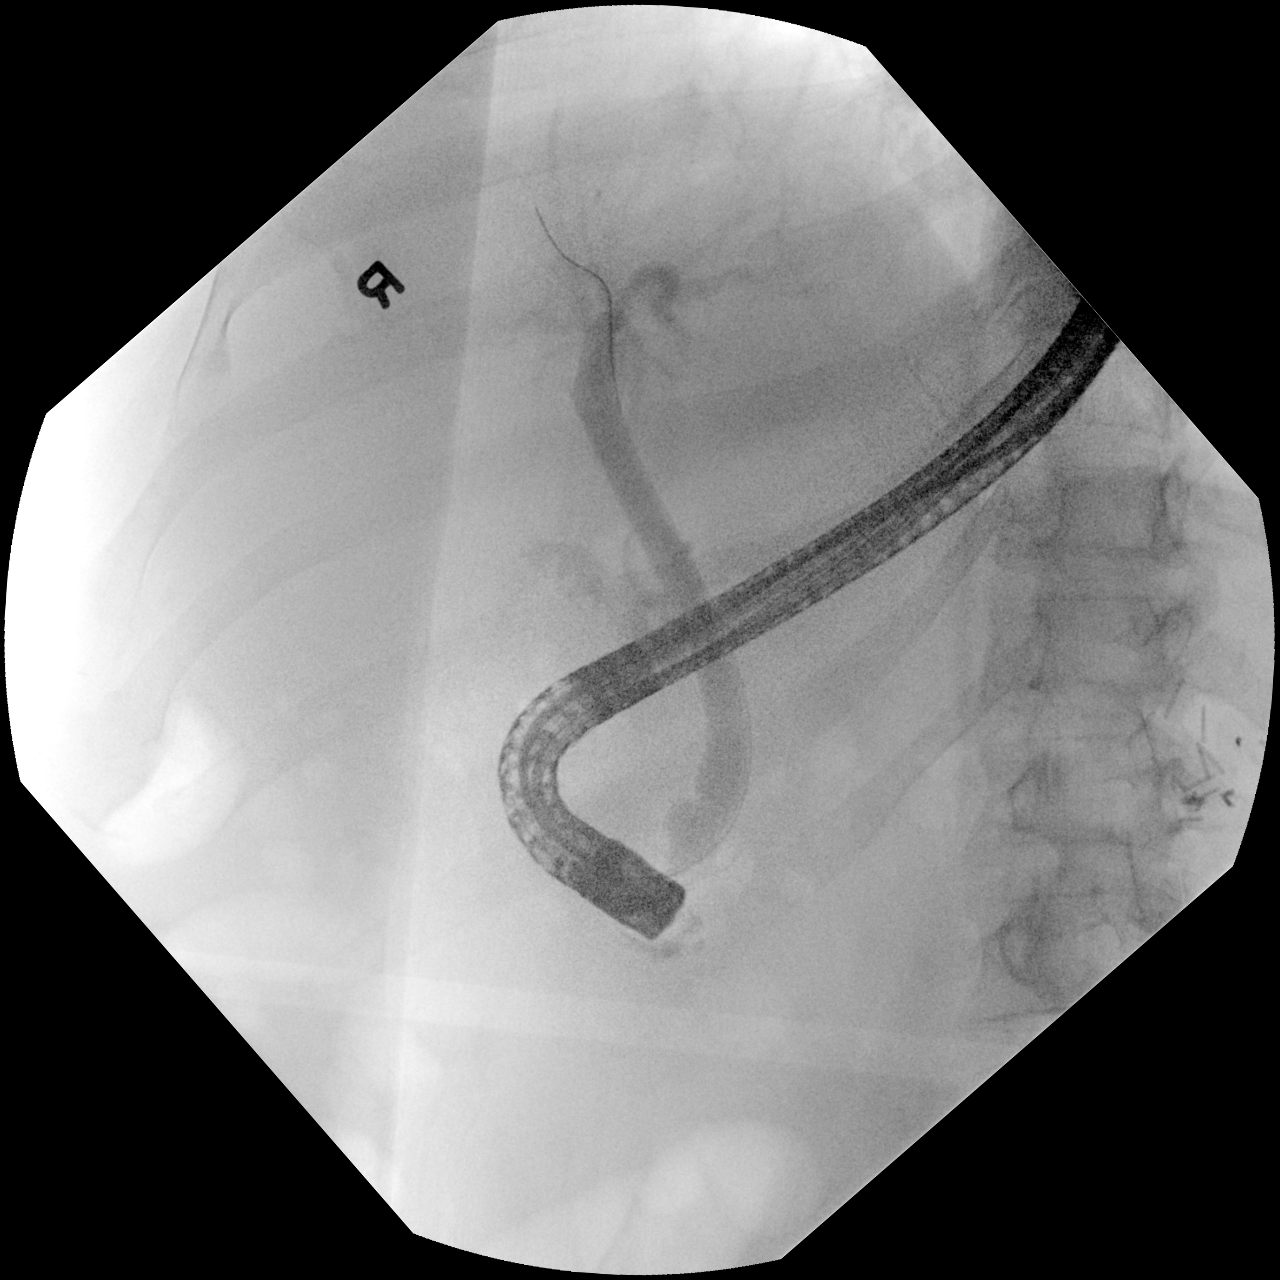
[im 3/3]
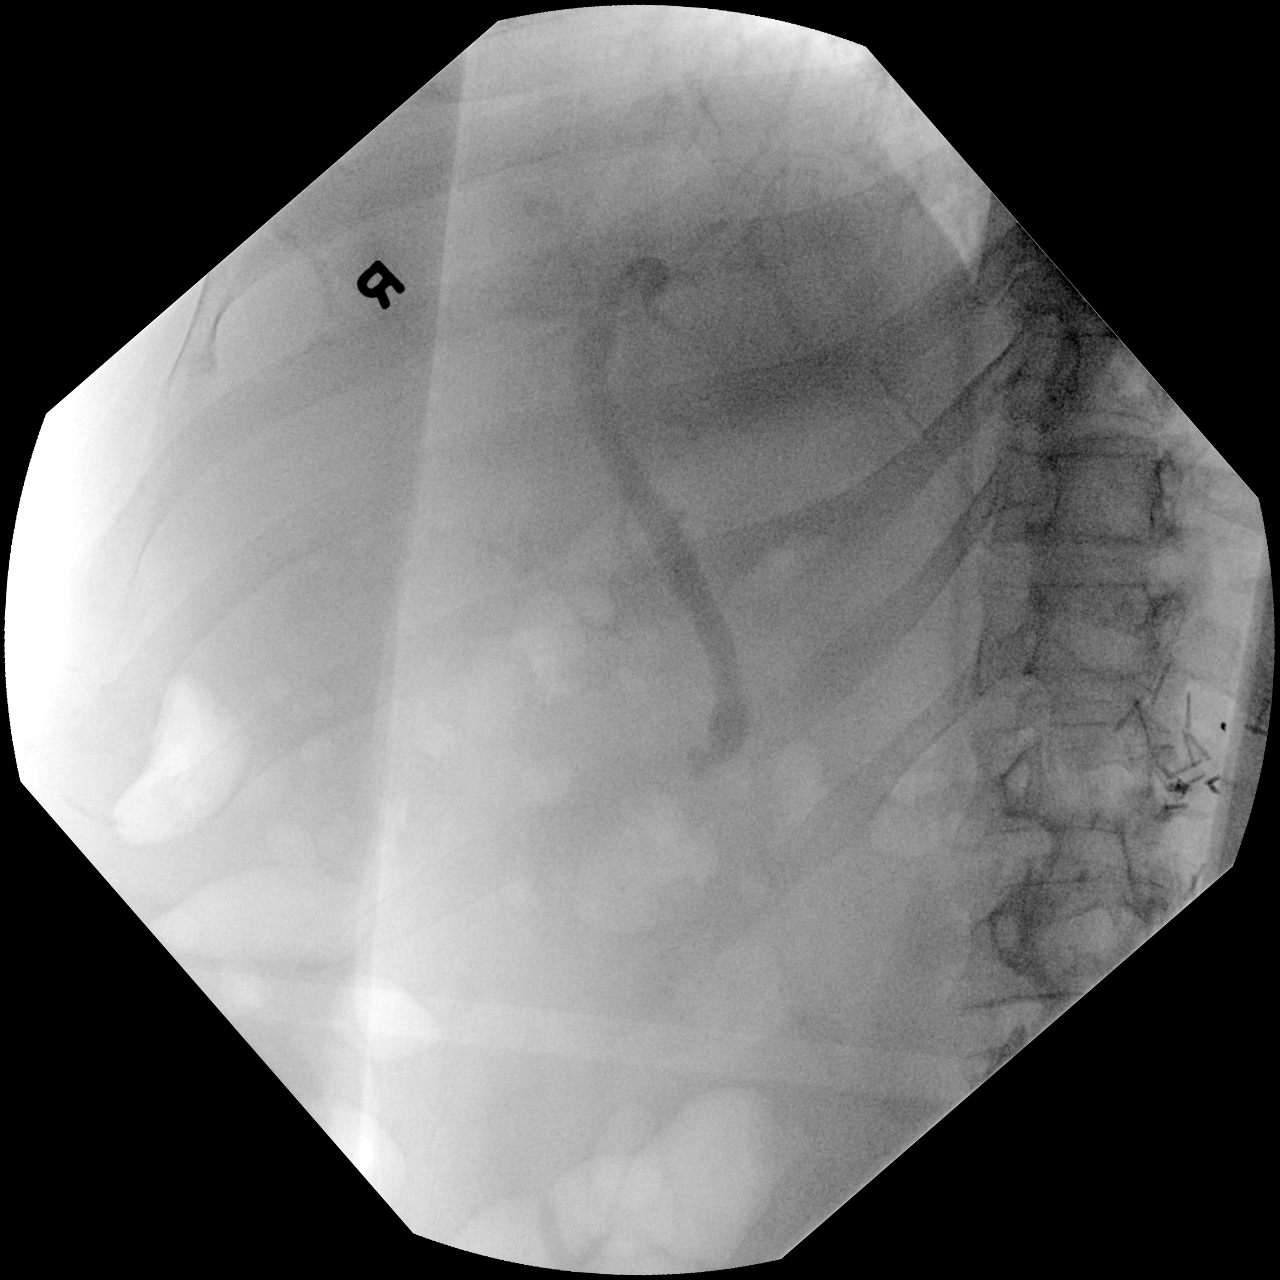

[3 of 3 positions shown; findings below may reference images not displayed]

FINDINGS: Retrograde cholangiogram raises concern for a filling defect in the
distal common bile duct that would correspond with the previous
MRCP. Questionable filling defect near the proximal common hepatic
duct. Wire was advanced into the biliary system. No definite filling
defects or stones on the final cholangiogram.
IMPRESSION: Choledocholithiasis.

These images were submitted for radiologic interpretation only.
Please see the procedural report for the amount of contrast and the
fluoroscopy time utilized.

## 2022-01-21 ENCOUNTER — Telehealth (INDEPENDENT_AMBULATORY_CARE_PROVIDER_SITE_OTHER): Payer: Self-pay

## 2022-01-21 NOTE — Telephone Encounter (Signed)
I attempted to contact the patient to schedule him for a IVC filter removal with Dr. Wyn Quaker. A message was left for a return call. ?

## 2022-09-01 ENCOUNTER — Encounter (INDEPENDENT_AMBULATORY_CARE_PROVIDER_SITE_OTHER): Payer: Self-pay
